# Patient Record
Sex: Male | Born: 1955 | Race: White | Hispanic: No | Marital: Married | Smoking: Current every day smoker
Health system: Southern US, Community
[De-identification: ages and names within clinical notes are randomized; demographics above are authoritative.]

## PROBLEM LIST (undated history)

## (undated) ENCOUNTER — Emergency Department (HOSPITAL_COMMUNITY): Discharge status: Absent without leave | Payer: 59

## (undated) DIAGNOSIS — Z7289 Other problems related to lifestyle: Secondary | ICD-10-CM

## (undated) DIAGNOSIS — I10 Essential (primary) hypertension: Secondary | ICD-10-CM

## (undated) DIAGNOSIS — F1090 Alcohol use, unspecified, uncomplicated: Secondary | ICD-10-CM

## (undated) DIAGNOSIS — G610 Guillain-Barre syndrome: Secondary | ICD-10-CM

## (undated) DIAGNOSIS — G5790 Unspecified mononeuropathy of unspecified lower limb: Secondary | ICD-10-CM

## (undated) DIAGNOSIS — I82409 Acute embolism and thrombosis of unspecified deep veins of unspecified lower extremity: Secondary | ICD-10-CM
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
## (undated) NOTE — *Deleted (*Deleted)
Patient discharged home with family, all belongings and equipment sent home with patient. Patient escort out by NT via [employer]

---
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
## 1997-04-21 HISTORY — PX: CHOLECYSTECTOMY: SHX55

## 2004-10-18 ENCOUNTER — Ambulatory Visit: Payer: Self-pay | Admitting: Family Medicine

## 2004-10-21 ENCOUNTER — Encounter (HOSPITAL_COMMUNITY): Admission: RE | Admit: 2004-10-21 | Discharge: 2004-11-20 | Payer: Self-pay

## 2005-01-31 ENCOUNTER — Ambulatory Visit: Payer: Self-pay | Admitting: Family Medicine

## 2005-02-28 ENCOUNTER — Ambulatory Visit: Payer: Self-pay | Admitting: Family Medicine

## 2005-12-25 ENCOUNTER — Ambulatory Visit: Payer: Self-pay | Admitting: Family Medicine

## 2006-02-10 ENCOUNTER — Ambulatory Visit: Payer: Self-pay | Admitting: Family Medicine

## 2006-07-20 ENCOUNTER — Ambulatory Visit: Payer: Self-pay | Admitting: Family Medicine

## 2006-09-28 ENCOUNTER — Ambulatory Visit: Payer: Self-pay | Admitting: Family Medicine

## 2008-07-06 ENCOUNTER — Emergency Department (HOSPITAL_COMMUNITY): Admission: EM | Admit: 2008-07-06 | Discharge: 2008-07-06 | Payer: Self-pay | Admitting: Emergency Medicine

## 2010-08-01 LAB — COMPREHENSIVE METABOLIC PANEL
ALT: 51 U/L (ref 0–53)
AST: 44 U/L — ABNORMAL HIGH (ref 0–37)
Albumin: 4.4 g/dL (ref 3.5–5.2)
Alkaline Phosphatase: 132 U/L — ABNORMAL HIGH (ref 39–117)
BUN: 17 mg/dL (ref 6–23)
CO2: 21 mEq/L (ref 19–32)
Calcium: 10 mg/dL (ref 8.4–10.5)
Chloride: 102 mEq/L (ref 96–112)
Creatinine, Ser: 0.69 mg/dL (ref 0.4–1.5)
GFR calc Af Amer: >60 mL/min (ref >60)
GFR calc non Af Amer: >60 mL/min (ref >60)
Glucose, Bld: 127 mg/dL — ABNORMAL HIGH (ref 70–99)
Potassium: 3.5 mEq/L (ref 3.5–5.1)
Sodium: 137 mEq/L (ref 135–145)
Total Bilirubin: 0.9 mg/dL (ref 0.3–1.2)
Total Protein: 6.9 g/dL (ref 6.0–8.3)

## 2010-08-01 LAB — CBC
HCT: 49.9 % (ref 39.0–52.0)
Hemoglobin: 17.3 g/dL — ABNORMAL HIGH (ref 13.0–17.0)
MCHC: 34.7 g/dL (ref 30.0–36.0)
MCV: 95.4 fL (ref 78.0–100.0)
Platelets: 163 10*3/uL (ref 150–400)
RBC: 5.23 MIL/uL (ref 4.22–5.81)
RDW: 13.5 % (ref 11.5–15.5)
WBC: 8.2 10*3/uL (ref 4.0–10.5)

## 2010-08-01 LAB — DIFFERENTIAL
Basophils Absolute: 0 10*3/uL (ref 0.0–0.1)
Basophils Relative: 0 % (ref 0–1)
Eosinophils Absolute: 0.3 10*3/uL (ref 0.0–0.7)
Eosinophils Relative: 3 % (ref 0–5)
Lymphocytes Relative: 30 % (ref 12–46)
Lymphs Abs: 2.5 10*3/uL (ref 0.7–4.0)
Monocytes Absolute: 0.5 10*3/uL (ref 0.1–1.0)
Monocytes Relative: 6 % (ref 3–12)
Neutro Abs: 5 10*3/uL (ref 1.7–7.7)
Neutrophils Relative %: 61 % (ref 43–77)

## 2010-08-01 LAB — POCT CARDIAC MARKERS
CKMB, poc: 1.1 ng/mL (ref 1.0–8.0)
Myoglobin, poc: 56.1 ng/mL (ref 12–200)
Troponin i, poc: <0.05 ng/mL (ref 0.00–0.09)

## 2010-09-17 ENCOUNTER — Other Ambulatory Visit (HOSPITAL_COMMUNITY): Payer: Self-pay | Admitting: Adult Health Nurse Practitioner

## 2010-09-17 ENCOUNTER — Ambulatory Visit (HOSPITAL_COMMUNITY)
Admission: RE | Admit: 2010-09-17 | Discharge: 2010-09-17 | Disposition: A | Discharge status: Home / Self Care | Payer: BC Managed Care – PPO | Source: Ambulatory Visit | Attending: Family Medicine | Admitting: Family Medicine

## 2010-09-17 DIAGNOSIS — I10 Essential (primary) hypertension: Secondary | ICD-10-CM | POA: Insufficient documentation

## 2010-09-17 DIAGNOSIS — J189 Pneumonia, unspecified organism: Secondary | ICD-10-CM

## 2010-09-17 DIAGNOSIS — R05 Cough: Secondary | ICD-10-CM | POA: Insufficient documentation

## 2010-09-17 DIAGNOSIS — R059 Cough, unspecified: Secondary | ICD-10-CM | POA: Insufficient documentation

## 2010-09-17 DIAGNOSIS — F17200 Nicotine dependence, unspecified, uncomplicated: Secondary | ICD-10-CM | POA: Insufficient documentation

## 2017-03-06 ENCOUNTER — Other Ambulatory Visit: Payer: Self-pay

## 2017-03-06 ENCOUNTER — Emergency Department (INDEPENDENT_AMBULATORY_CARE_PROVIDER_SITE_OTHER)
Admission: EM | Admit: 2017-03-06 | Discharge: 2017-03-06 | Disposition: A | Discharge status: Home / Self Care | Payer: 59 | Source: Home / Self Care | Attending: Family Medicine | Admitting: Family Medicine

## 2017-03-06 ENCOUNTER — Encounter: Payer: Self-pay | Admitting: Emergency Medicine

## 2017-03-06 ENCOUNTER — Emergency Department (INDEPENDENT_AMBULATORY_CARE_PROVIDER_SITE_OTHER): Payer: 59

## 2017-03-06 DIAGNOSIS — M25562 Pain in left knee: Secondary | ICD-10-CM

## 2017-03-06 HISTORY — DX: Essential (primary) hypertension: I10

## 2017-03-06 HISTORY — DX: Guillain-Barre syndrome: G61.0

## 2017-03-06 MED ORDER — MELOXICAM 15 MG PO TABS: 15.0000 mg | ORAL_TABLET | Freq: Every day | ORAL | 0 refills | Status: DC

## 2017-03-06 NOTE — Discharge Instructions (Signed)
°  Meloxicam (Mobic) is an antiinflammatory to help with pain and inflammation.  Do not take ibuprofen, Advil, Aleve, or any other medications that contain NSAIDs while taking meloxicam as this may cause stomach upset or even ulcers if taken in large amounts for an extended period of time.  ° °

## 2017-03-06 NOTE — ED Triage Notes (Signed)
Patient told his PCP 2 weeks ago he was having pain in left knee; was placed on anti-inflammatories and is to follow with him in another 2 weeks; today it began hurting so badly that he could not bear weight. Took ibuprofen at 1100. Brought from waiting room in wheelchair at his request.

## 2017-03-06 NOTE — ED Provider Notes (Signed)
[facility]    CSN: [id number] Arrival date & time: 03/06/17  1617     History   Chief Complaint Chief Complaint  Patient presents with  . Knee Pain    left    HPI Roberto LivingMark A Pavia is a 61 y.o. male.   HPI  Roberto Lynch is a 61 y.o. male presenting to UC with c/o Left knee pain that started about 2 weeks ago. He told his PCP during a routine physical and was prescribed an antiinflammatory but is unsure of the name of the medication.  He had moderate relief with the medication but completed it about 4-5 days ago.  He was advised to f/u in 2 weeks but states the pain was worsening so bad that he can barely apply weight to Left leg now.  He took ibuprofen at 11AM this morning.  Denies radiation of pain or numbness in Left leg. No known injury. No prior surgery to same knee.  No hx of gout.  Past Medical History:  Diagnosis Date  . Guillain Barr syndrome (HCC)   . Hypertension     There are no active problems to display for this patient.   Past Surgical History:  Procedure Laterality Date  . CHOLECYSTECTOMY    . TONSILLECTOMY         Home Medications    Prior to Admission medications   Medication Sig Start Date End Date Taking? Authorizing Provider  amLODipine-benazepril (LOTREL) 10-20 MG capsule Take 1 capsule daily by mouth.   Yes [provider]  nebivolol (BYSTOLIC) 10 MG tablet Take 10 mg daily by mouth.   Yes [provider]  meloxicam (MOBIC) 15 MG tablet Take 1 tablet (15 mg total) daily by mouth. For 5 days, then daily as needed for pain 03/06/17   Rolla PlatePhelps, Marrianne Sica O, PA-C    Family History History reviewed. No pertinent family history.  Social History Social History   Tobacco Use  . Smoking status: Current Every Day Smoker  . Smokeless tobacco: Never Used  Substance Use Topics  . Alcohol use: No    Frequency: Never  . Drug use: Not on file     Allergies   Bee venom   Review of Systems Review of Systems    Constitutional: Negative for chills and fever.  Musculoskeletal: Positive for arthralgias, gait problem (difficulty bearing weight on Left leg), joint swelling and myalgias.  Skin: Negative for color change and rash.  Neurological: Negative for weakness and numbness.     Physical Exam Triage Vital Signs ED Triage Vitals  Enc Vitals Group     BP 03/06/17 1701 (!) 154/76     Pulse Rate 03/06/17 1701 66     Resp 03/06/17 1701 18     Temp 03/06/17 1701 98.5 F (36.9 C)     Temp Source 03/06/17 1701 Oral     SpO2 03/06/17 1701 95 %     Weight 03/06/17 1702 270 lb (122.5 kg)     Height 03/06/17 1702 5\' 9"  (1.753 m)     Head Circumference --      Peak Flow --      Pain Score 03/06/17 1702 9     Pain Loc --      Pain Edu? --      Excl. in GC? --    No data found.  Updated Vital Signs BP (!) 154/76 (BP Location: Right Arm)   Pulse 66   Temp 98.5 F (36.9 C) (Oral)  Resp 18   Ht 5\' 9"  (1.753 m)   Wt 270 lb (122.5 kg)   SpO2 95%   BMI 39.87 kg/m   Visual Acuity Right Eye Distance:   Left Eye Distance:   Bilateral Distance:    Right Eye Near:   Left Eye Near:    Bilateral Near:     Physical Exam  Constitutional: He is oriented to person, place, and time. He appears well-developed and well-nourished. No distress.  HENT:  Head: Normocephalic and atraumatic.  Eyes: EOM are normal.  Neck: Normal range of motion.  Cardiovascular: Normal rate.  Pulmonary/Chest: Effort normal.  Musculoskeletal: Normal range of motion. He exhibits edema and tenderness.  Left knee: mild edema to medial aspect. Tender. Full ROM w/o crepitus. No posterior knee tenderness. No hip tenderness.  Calf is soft, non-tender.  Neurological: He is alert and oriented to person, place, and time.  Skin: Skin is warm and dry. He is not diaphoretic. No erythema.  Left knee: skin in tact. No ecchymosis or erythema.  Psychiatric: He has a normal mood and affect. His behavior is normal.  Nursing note and  vitals reviewed.    UC Treatments / Results  Labs (all labs ordered are listed, but only abnormal results are displayed) Labs Reviewed - No data to display  EKG  EKG Interpretation None       Radiology Dg Knee Complete 4 Views Left  Result Date: 03/06/2017 CLINICAL DATA:  Injury several months ago with persistent pain, initial encounter EXAM: LEFT KNEE - COMPLETE 4+ VIEW COMPARISON:  None. FINDINGS: No acute fracture or dislocation is noted. Mild degenerative changes are noted in the medial and patellofemoral joint spaces. No soft tissue abnormality is seen. IMPRESSION: Mild degenerative change without acute abnormality. Electronically Signed   By: Alcide CleverMark  Lukens M.D.   On: 03/06/2017 17:43    Procedures Procedures (including critical care time)  Medications Ordered in UC Medications - No data to display   Initial Impression / Assessment and Plan / UC Course  I have reviewed the triage vital signs and the nursing notes.  Pertinent labs & imaging results that were available during my care of the patient were reviewed by me and considered in my medical decision making (see chart for details).     Left knee: mild degenerative change w/o joint effusion or other acute abnormality Discussed imaging with pt Encouraged use of knee sleeve and Meloxicam F/u with Sports Medicine next week for further evaluation and treatment May benefit from knee joint injection. Pt states he had a joint injection in his Right shoulder several years ago and states that pain resolved, he hopes he can have the same done to his knee.  Final Clinical Impressions(s) / UC Diagnoses   Final diagnoses:  Left medial knee pain  Acute pain of left knee    ED Discharge Orders        Ordered    meloxicam (MOBIC) 15 MG tablet  Daily     03/06/17 1819       Controlled Substance Prescriptions [place] Controlled Substance Registry consulted? Not Applicable   Rolla Platehelps, Nezzie Manera O, PA-C 03/07/17 1207

## 2017-03-08 ENCOUNTER — Telehealth: Payer: Self-pay | Admitting: Emergency Medicine

## 2017-03-08 NOTE — Telephone Encounter (Signed)
Spoke with patient states that his knee is no better, still painful, will contact sports medicine doctors tomorrow about an appointment.

## 2017-03-09 ENCOUNTER — Encounter: Payer: Self-pay | Admitting: Family Medicine

## 2017-03-09 ENCOUNTER — Ambulatory Visit (INDEPENDENT_AMBULATORY_CARE_PROVIDER_SITE_OTHER): Payer: 59 | Admitting: Family Medicine

## 2017-03-09 VITALS — BP 144/78 | HR 78 | Temp 97.7°F | Resp 16 | Ht 69.0 in | Wt 278.1 lb

## 2017-03-09 DIAGNOSIS — M25562 Pain in left knee: Secondary | ICD-10-CM

## 2017-03-09 MED ORDER — DICLOFENAC SODIUM 1 % TD GEL: 4.0000 g | Freq: Four times a day (QID) | TRANSDERMAL | 11 refills | Status: DC

## 2017-03-09 NOTE — Progress Notes (Signed)
Subjective:    I'm seeing this patient as a consultation for:  Lurene Shadowhelps, Erin O, PA-C  CC: Left Knee Pain  HPI: Loraine LericheMark has pain in the left knee ongoing now for months.  The pain dramatically worsened a few days injury recently.  He notes some pain and swelling but denies significant locking or catching.  He points to the medial aspect of his knee as location of pain.  He was seen in urgent care on November 16 were x-ray showed mild DJD but otherwise was on he is unable to work currently.  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  In urgent care he was given prednisone, meloxicam,  Tramadol all of which have helped only a little.  He denies any pre-existing history of gout but has never been tested.   Past medical history, Surgical history, Family history not pertinant except as noted below, Social history, Allergies, and medications have been entered into the medical record, reviewed, and no changes needed.   Review of Systems: No headache, visual changes, nausea, vomiting, diarrhea, constipation, dizziness, abdominal pain, skin rash, fevers, chills, night sweats, weight loss, swollen lymph nodes, body aches, joint swelling, muscle aches, chest pain, shortness of breath, mood changes, visual or auditory hallucinations.   Objective:    Vitals:   03/09/17 1030  BP: (!) 144/78  Pulse: 78  Resp: 16  Temp: 97.7 F (36.5 C)  SpO2: 97%   General: Well Developed, well nourished, and in no acute distress.  Neuro/Psych: Alert and oriented x3, extra-ocular muscles intact, able to move all 4 extremities, sensation grossly intact. Skin: Warm and dry, no rashes noted.  Respiratory: Not using accessory muscles, speaking in full sentences, trachea midline.  Cardiovascular: Pulses palpable, no extremity edema. Abdomen: Does not appear distended. MSK: Left knee mild effusion otherwise normal-appearing with no. Range of motion 0 patient's present. Tender to palpation medial joint  line. Intact flexion and extension strength. Intact stable ligamentous exam testing. Positive medial McMurray's test. Positive Thessaly's test. Antalgic gait present.  Procedure: Real-time Ultrasound Guided Injection of left knee  Device: GE Logiq E  Images permanently stored and available for review in the ultrasound unit. Verbal informed consent obtained. Discussed risks and benefits of procedure. Warned about infection bleeding damage to structures skin hypopigmentation and fat atrophy among others. Patient expresses understanding and agreement Time-out conducted.  Noted no overlying erythema, induration, or other signs of local infection.  Skin prepped in a sterile fashion.  Local anesthesia: Topical Ethyl chloride.  With sterile technique and under real time ultrasound guidance: 80mg  kenalog and 4ml marcaine injected easily.  Completed without difficulty  Pain immediately resolved suggesting accurate placement of the medication.  Advised to call if fevers/chills, erythema, induration, drainage, or persistent bleeding.  Images permanently stored and available for review in the ultrasound unit.  Impression: Technically successful ultrasound guided injection.    CLINICAL DATA:  Injury several months ago with persistent pain, initial encounter  EXAM: LEFT KNEE - COMPLETE 4+ VIEW  COMPARISON:  None.  FINDINGS: No acute fracture or dislocation is noted. Mild degenerative changes are noted in the medial and patellofemoral joint spaces. No soft tissue abnormality is seen.  IMPRESSION: Mild degenerative change without acute abnormality.   Electronically Signed   By: Alcide CleverMark  Lukens M.D.   On: 03/06/2017 17:43    Impression and Recommendations:    Assessment and Plan: 61 y.o. male with left knee pain.  This is concerning for degenerative medial  meniscus injury versus exacerbation of DJD versus gout.  Plan to try an empiric treatment with steroid injection  as well as diclofenac gel.  If not better would rapidly proceed with MRI of his the pain is currently debilitating and preventing work and I am concerned for either a meniscus injury or  OCD lesion. Work note provided .  Recheck as needed if not better.   No orders of the defined types were placed in this encounter.  Meds ordered this encounter  Medications  . diclofenac sodium (VOLTAREN) 1 % GEL    Sig: Apply 4 g 4 (four) times daily topically. To affected joint.    Dispense:  100 g    Refill:  11    Discussed warning signs or symptoms. Please see discharge instructions. Patient expresses understanding.

## 2017-03-09 NOTE — Patient Instructions (Signed)
Thank you for coming in today. Call or go to the ER if you develop a large red swollen joint with extreme pain or oozing puss.  Apply the diclofenac gel  Recheck as needed. If not getting better let me know and I will order the MRI.

## 2017-03-18 ENCOUNTER — Telehealth: Payer: Self-pay

## 2017-03-18 DIAGNOSIS — M25562 Pain in left knee: Secondary | ICD-10-CM

## 2017-03-18 NOTE — Telephone Encounter (Signed)
Spoke to patient gave him information as noted below. Walfred Bettendorf,CMA  

## 2017-03-18 NOTE — Telephone Encounter (Signed)
Knee MRI ordered.  Follow up after MRI.  If he cannot work return to clinic to go over pain control options and work restrictions.  Otherwise follow up after MRI>

## 2017-03-18 NOTE — Telephone Encounter (Signed)
Patient called stated that he got cortisone injections done a week and half ago he stated that he was told to call the office back if not better, he  is still hurting. Patient want to know what he needs to do at this point. Please advise. Fountain Derusha,CMA

## 2017-04-06 ENCOUNTER — Other Ambulatory Visit: Payer: 59

## 2017-05-04 ENCOUNTER — Ambulatory Visit (INDEPENDENT_AMBULATORY_CARE_PROVIDER_SITE_OTHER): Payer: 59

## 2017-05-04 DIAGNOSIS — M25562 Pain in left knee: Secondary | ICD-10-CM

## 2017-05-04 DIAGNOSIS — M25462 Effusion, left knee: Secondary | ICD-10-CM | POA: Diagnosis not present

## 2017-05-04 DIAGNOSIS — M23222 Derangement of posterior horn of medial meniscus due to old tear or injury, left knee: Secondary | ICD-10-CM | POA: Diagnosis not present

## 2017-05-05 ENCOUNTER — Encounter: Payer: Self-pay | Admitting: Family Medicine

## 2017-05-05 ENCOUNTER — Ambulatory Visit (INDEPENDENT_AMBULATORY_CARE_PROVIDER_SITE_OTHER): Payer: 59 | Admitting: Family Medicine

## 2017-05-05 DIAGNOSIS — S83232A Complex tear of medial meniscus, current injury, left knee, initial encounter: Secondary | ICD-10-CM | POA: Diagnosis not present

## 2017-05-05 DIAGNOSIS — S83249A Other tear of medial meniscus, current injury, unspecified knee, initial encounter: Secondary | ICD-10-CM | POA: Insufficient documentation

## 2017-05-05 NOTE — Patient Instructions (Signed)
Thank you for coming in today. You have a meniscus injury.  I think you would benefit from surgery.  I am referring to [facility] in [place].  You should hear form them soon.  Let me know if you do not hear anything.  [phone]   Meniscus Tear A meniscus tear is a knee injury in which a piece of the meniscus is torn. The meniscus is a thick, rubbery, wedge-shaped cartilage in the knee. Two menisci are located in each knee. They sit between the upper bone (femur) and lower bone (tibia) that make up the knee joint. Each meniscus acts as a shock absorber for the knee. A torn meniscus is one of the most common types of knee injuries. This injury can range from mild to severe. Surgery may be needed for a severe tear. What are the causes? This injury may be caused by any squatting, twisting, or pivoting movement. Sports-related injuries are the most common cause. These often occur from:  Running and stopping suddenly.  Changing direction.  Being tackled or knocked off your feet.  As people get older, their meniscus gets thinner and weaker. In these people, tears can happen more easily, such as from climbing stairs. What increases the risk? This injury is more likely to happen to:  People who play contact sports.  Males.  People who are 630?60 years of age.  What are the signs or symptoms? Symptoms of this injury include:  Knee pain, especially at the side of the knee joint. You may feel pain when the injury occurs, or you may only hear a pop and feel pain later.  A feeling that your knee is clicking, catching, locking, or giving way.  Not being able to fully bend or extend your knee.  Bruising or swelling in your knee.  How is this diagnosed? This injury may be diagnosed based on your symptoms and a physical exam. The physical exam may include:  Moving your knee in different ways.  Feeling for tenderness.  Listening for a clicking sound.  Checking if  your knee locks or catches.  You may also have tests, such as:  X-rays.  MRI.  A procedure to look inside your knee with a narrow surgical telescope (arthroscopy).  You may be referred to a knee specialist (orthopedic surgeon). How is this treated? Treatment for this injury depends on the severity of the tear. Treatment for a mild tear may include:  Rest.  Medicine to reduce pain and swelling. This is usually a nonsteroidal anti-inflammatory drug (NSAID).  A knee brace or an elastic sleeve or wrap.  Using crutches or a walker to keep weight off your knee and to help you walk.  Exercises to strengthen your knee (physical therapy).  You may need surgery if you have a severe tear or if other treatments are not working. Follow these instructions at home: Managing pain and swelling  Take over-the-counter and prescription medicines only as told by your health care provider.  If directed, apply ice to the injured area: ? Put ice in a plastic bag. ? Place a towel between your skin and the bag. ? Leave the ice on for 20 minutes, 2-3 times per day.  Raise (elevate) the injured area above the level of your heart while you are sitting or lying down. Activity  Do not use the injured limb to support your body weight until your health care provider says that you can. Use crutches or a walker as told by your  health care provider.  Return to your normal activities as told by your health care provider. Ask your health care provider what activities are safe for you.  Perform range-of-motion exercises only as told by your health care provider.  Begin doing exercises to strengthen your knee and leg muscles only as told by your health care provider. After you recover, your health care provider may recommend these exercises to help prevent another injury. General instructions  Use a knee brace or elastic wrap as told by your health care provider.  Keep all follow-up visits as told by your  health care provider. This is important. Contact a health care provider if:  You have a fever.  Your knee becomes red, tender, or swollen.  Your pain medicine is not helping.  Your symptoms get worse or do not improve after 2 weeks of home care. This information is not intended to replace advice given to you by your health care provider. Make sure you discuss any questions you have with your health care provider. Document Released: 06/28/2002 Document Revised: 09/13/2015 Document Reviewed: 07/31/2014 Elsevier Interactive Patient Education  [employer].
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
## 2017-05-26 ENCOUNTER — Ambulatory Visit: Payer: 59 | Attending: Orthopedic Surgery | Admitting: Physical Therapy

## 2017-05-26 ENCOUNTER — Other Ambulatory Visit: Payer: Self-pay

## 2017-05-26 DIAGNOSIS — R262 Difficulty in walking, not elsewhere classified: Secondary | ICD-10-CM | POA: Insufficient documentation

## 2017-05-26 DIAGNOSIS — M25562 Pain in left knee: Secondary | ICD-10-CM | POA: Insufficient documentation

## 2017-05-26 NOTE — Therapy (Signed)
[facility] [address] [place], [place], [address] Phone: [phone]   Fax:  [phone]  Physical Therapy Evaluation  Patient Details  Name: Roberto Lynch MRN: [id number] Date of Birth: 05-25-1955 Referring Provider: Thurston Hole   Encounter Date: 05/26/2017  PT End of Session - 05/26/17 0933    Visit Number  1    Number of Visits  12    Date for PT Re-Evaluation  07/07/17    Authorization Type  FOTO every 5th visit    PT Start Time  0817    PT Stop Time  0902    PT Time Calculation (min)  45 min    Activity Tolerance  Patient tolerated treatment well    Behavior During Therapy  [facility] for tasks assessed/performed       Past Medical History:  Diagnosis Date  . Guillain Barr syndrome (HCC)   . Hypertension     Past Surgical History:  Procedure Laterality Date  . CHOLECYSTECTOMY  1999  . TONSILLECTOMY  1969    There were no vitals filed for this visit.   Subjective Assessment - 05/26/17 0928    Subjective  Patient presents to physical therapy with complaints of knee pain and L leg weakness s/p L medial and lateral meniscectomy and chondroplasty. Patient unable to recall date but states surgery was in the latter half of January, approximately 2 weeks ago. (Week of Jan 22) Patient states he is currently unable to stand for greater than 30 minutes to an hour and has difficulty walking long distances. Patient's at worst pain about 4/10 during walking, kneeling, and increased activity throughout the day. Patient has no pain at rest and when he ices it. Patient is currently not working; pt reports expected to return in 2-3 weeks. 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     Pertinent History  COPD, HTN    Limitations  Walking;Standing    How long can you stand comfortably?  30 mins - 1hr    Diagnostic tests  MRI, x-ray    Patient Stated Goals   walk long distances, crawl, go up and down ladders, get back to where I was before    Currently in Pain?  Yes    Pain Score  3     Pain Location  Knee    Pain Orientation  Left    Pain Descriptors / Indicators  Aching;Sore    Pain Type  Acute pain    Pain Onset  1 to 4 weeks ago    Pain Frequency  Intermittent    Aggravating Factors   walking, standing, and kneeling    Pain Relieving Factors  ice and rest         [facility] PT Assessment - 05/26/17 0001      Assessment   Medical Diagnosis  L knee medial and lateral menisectomy and chondroplasty    Referring Provider  [facility]    Onset Date/Surgical Date  -- Late January; unable to recall date    Next MD Visit  June 12, 2017    Prior Therapy  none      Restrictions   Weight Bearing Restrictions  No      Balance Screen   Has the patient fallen in the past 6 months  No    Has the patient had a decrease in activity level because of a fear of falling?  No    Is the patient reluctant to leave their home because of a fear of falling?   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 incision healing with little scabbing remaining    Focus on Therapeutic Outcomes (FOTO)   62% Limitation      Observation/Other Assessments-Edema    Edema  Circumferential no significant difference between LEs.      Sensation   Light Touch  Appears Intact      ROM / Strength   AROM / PROM / Strength  AROM;PROM;Strength      AROM   Overall AROM   Within functional limits for tasks performed    AROM Assessment Site  Knee    Right/Left Knee   Left    Left Knee Extension  -2    Left Knee Flexion  125      PROM   Overall PROM   Within functional limits for tasks performed    PROM Assessment Site  Knee    Right/Left Knee  Left    Left Knee Extension  0    Left Knee Flexion  130      Strength   Overall Strength  Within functional limits for tasks performed    Overall Strength Comments  4+/5 bilateral hip flexion strength; 5/5 ankle DF strength.    Strength Assessment Site  Knee    Right/Left Knee  Left    Left Knee Flexion  4+/5    Left Knee Extension  5/5      Flexibility   Soft Tissue Assessment /Muscle Length  yes    Hamstrings  limited hamstring flexibilty secondary to "tightness in calves"      Transfers   Transfers  Independent with all Transfers      Ambulation/Gait   Ambulation/Gait  Yes    Ambulation/Gait Assistance  7: Independent    Gait Pattern  Step-through pattern;Decreased hip/knee flexion - left;Decreased weight shift to left;Wide base of support;Antalgic;Decreased step length - right;Decreased stance time - left      Balance   Balance Assessed  Yes      High Level Balance   High Level Balance Comments  L SLS 6 seconds; tandem 8 seconds; increased AP and lateral sway and arms in high guard.             Objective measurements completed on examination: See above findings.      [facility] Adult PT Treatment/Exercise - 05/26/17 0001      Exercises   Exercises  Knee/Hip      Knee/Hip Exercises: Aerobic   Nustep  Level 5, x10 minutes, no UE use               PT Short Term Goals - 05/26/17 1153      PT SHORT TERM GOAL #1   Title  STG=LTG        PT Long Term Goals - 05/26/17 1154      PT LONG TERM GOAL #1  Title  Patient will improve L knee flexion strength to 5/5 to better perform functional activities.    Time  6    Period  Weeks    Status  New    Target Date  07/07/17      PT LONG TERM GOAL #2   Title  Patient will improve L SLS to greater than or equal to 10  seconds.    Time  6    Period  Weeks    Status  New    Target Date  07/07/17      PT LONG TERM GOAL #3   Title  Patient will self report ability to walk community distances with less than or equal to 1/10 L knee pain in order to perform work duties.    Time  6    Period  Weeks    Status  New    Target Date  07/07/17      PT LONG TERM GOAL #4   Title  Patient will be able to tolerate kneeling and quadruped position for greater than 30 minutes with less than or equal to 2/10 L knee pain in order to perform crawling for work activities.    Time  6    Period  Weeks    Status  New    Target Date  07/07/17      PT LONG TERM GOAL #5   Title  Patient will be able to stand with equal LE weight distribution for greater than 1 hour with less than or equal to 1/10 pain to perform household activities.     Time  6    Period  Weeks    Status  New    Target Date  07/07/17      Additional Long Term Goals   Additional Long Term Goals  Yes      PT LONG TERM GOAL #6   Title  Patient will self report no pain with ascending and descending a ladder with reciprocating gait pattern in order to perform work activities.     Time  6    Period  Weeks    Status  New    Target Date  07/07/17             Plan - 05/26/17 1145    Clinical Impression Statement  Patient is a 62 year-old-male who presents to physical therapy s/p L knee medial and lateral meniscectomy and chondroplasty. Patient has very good strength and AROM however patient still reports difficulty with functional activities and work-related activities secondary to pain. Patient demonstrates antalgic gait pattern, decreased R step length, decreased L stance time, increased lateral sway, and decreased L knee flexion during swing phase. Patient has difficulty with SLS balance; limited to 6 seconds. Patient would benefit from skilled physical therapy to improve strength and stability of left LE and improve balance for patient to perform work  duties and house hold activities. Patient demonstrates very good rehab potential as noted by  high PLOF, high motivation to return to [facility] and return to work.     Clinical Presentation  Stable    Clinical Decision Making  Low    Rehab Potential  Excellent    Clinical Impairments Affecting Rehab Potential  COPD, HTN    PT Frequency  2x / week    PT Duration  6 weeks    PT Treatment/Interventions  ADLs/Self Care Home Management;Electrical Stimulation;Iontophoresis 4mg /ml Dexamethasone;Moist Heat;Cryotherapy;Ultrasound;Therapeutic exercise;Therapeutic activities;Functional mobility training;Stair training;Gait training;Balance training;Neuromuscular re-education;Patient/family education;Manual techniques;Passive  range of motion;Dry needling;Vasopneumatic Device    PT Next Visit Plan  Begin knee stabilization and strengthening program; modalities PRN for pain.    Consulted and Agree with Plan of Care  Patient       Patient will benefit from skilled therapeutic intervention in order to improve the following deficits and impairments:  Abnormal gait, Decreased balance, Difficulty walking, Decreased strength, Pain  Visit Diagnosis: Left knee pain, unspecified chronicity - Plan: PT plan of care cert/re-cert  Difficulty in walking, not elsewhere classified - Plan: PT plan of care cert/re-cert     Problem List Patient Active Problem List   Diagnosis Date Noted  . Medial meniscus tear 05/05/2017   Guss Bunde, PT, DPT 05/26/2017, 12:17 PM  [facility] [address] [place], [place], [address] Phone: [phone]   Fax:  [phone]  Name: DEZMEN ALCOCK MRN: [id number] Date of Birth: 10-Mar-1956

## 2017-05-28 ENCOUNTER — Encounter: Payer: Self-pay | Admitting: Physical Therapy

## 2017-05-28 ENCOUNTER — Ambulatory Visit: Payer: 59 | Admitting: Physical Therapy

## 2017-05-28 DIAGNOSIS — R262 Difficulty in walking, not elsewhere classified: Secondary | ICD-10-CM

## 2017-05-28 DIAGNOSIS — M25562 Pain in left knee: Secondary | ICD-10-CM

## 2017-05-28 NOTE — Therapy (Signed)
[facility] [address] [place], [place], [address] Phone: [phone]   Fax:  [phone]  Physical Therapy Treatment  Patient Details  Name: Roberto Lynch MRN: [id number] Date of Birth: [birth year] Referring Provider: Thurston Hole   Encounter Date: 05/28/2017  PT End of Session - 05/28/17 0819    Visit Number  2    Number of Visits  12    Date for PT Re-Evaluation  07/07/17    Authorization Type  FOTO every 5th visit    PT Start Time  0817    PT Stop Time  0905    PT Time Calculation (min)  48 min    Activity Tolerance  Patient tolerated treatment well    Behavior During Therapy  [facility] for tasks assessed/performed       Past Medical History:  Diagnosis Date  . Guillain Barr syndrome (HCC)   . Hypertension     Past Surgical History:  Procedure Laterality Date  . CHOLECYSTECTOMY  1999  . TONSILLECTOMY  1969    There were no vitals filed for this visit.  Subjective Assessment - 05/28/17 0818    Subjective  Reports that he only really has pain when he puts weight on it. Reports that he has knee stiffness upon arrival.    Pertinent History  COPD, HTN    Limitations  Walking;Standing    How long can you stand comfortably?  30 mins - 1hr    Diagnostic tests  MRI, x-ray    Patient Stated Goals  walk long distances, crawl, go up and down ladders, get back to where I was before    Currently in Pain?  Yes    Pain Score  3     Pain Location  Knee    Pain Orientation  Left    Pain Type  Acute pain    Pain Onset  1 to 4 weeks ago    Pain Frequency  Intermittent    Aggravating Factors   Weightbearing         [facility] PT Assessment - 05/28/17 0001      Assessment   Medical Diagnosis  L knee medial and lateral menisectomy and chondroplasty    Next MD Visit  June 12, 2017    Prior Therapy  none      Restrictions   Weight Bearing Restrictions  No                  [facility] Adult PT Treatment/Exercise - 05/28/17 0001      Knee/Hip Exercises: Aerobic   Nustep  Level 5, x10 minutes, no UE use      Knee/Hip Exercises: Machines for Strengthening   Cybex Knee Extension  20# 3x10 reps    Cybex Knee Flexion  50# 3x10 reps    Cybex Leg Press  3 pl, seat 8 x30 reps      Knee/Hip Exercises: Standing   Terminal Knee Extension  Strengthening;Left;20 reps;Limitations    Terminal Knee Extension Limitations  Pin kXTS    Hip Abduction  AROM;Both;15 reps;Knee straight    Forward Step Up  Left;Step Height: 4";Step Height: 6";Step Height: 8" With all heights attempted patient reported medial L knee pa      Knee/Hip Exercises: Supine   Bridges  Strengthening;Both;20 reps    Straight Leg Raises  Strengthening;Left;20 reps    Straight Leg Raise with External Rotation  Strengthening;Left;20 reps      Knee/Hip Exercises: Sidelying   Hip ABduction  AROM;Left;20 reps  Modalities   Modalities  Vasopneumatic      Vasopneumatic   Number Minutes Vasopneumatic   10 minutes    Vasopnuematic Location   Knee    Vasopneumatic Pressure  Low    Vasopneumatic Temperature   34               PT Short Term Goals - 05/26/17 1153      PT SHORT TERM GOAL #1   Title  STG=LTG        PT Long Term Goals - 05/26/17 1154      PT LONG TERM GOAL #1   Title  Patient will improve L knee flexion strength to 5/5 to better perform functional activities.    Time  6    Period  Weeks    Status  New    Target Date  07/07/17      PT LONG TERM GOAL #2   Title  Patient will improve L SLS to greater than or equal to 10 seconds.    Time  6    Period  Weeks    Status  New    Target Date  07/07/17      PT LONG TERM GOAL #3   Title  Patient will self report ability to walk community distances with less than or equal to 1/10 L knee pain in order to perform work duties.    Time  6    Period  Weeks    Status  New    Target Date  07/07/17      PT LONG TERM GOAL #4   Title  Patient will be able to tolerate kneeling and  quadruped position for greater than 30 minutes with less than or equal to 2/10 L knee pain in order to perform crawling for work activities.    Time  6    Period  Weeks    Status  New    Target Date  07/07/17      PT LONG TERM GOAL #5   Title  Patient will be able to stand with equal LE weight distribution for greater than 1 hour with less than or equal to 1/10 pain to perform household activities.     Time  6    Period  Weeks    Status  New    Target Date  07/07/17      Additional Long Term Goals   Additional Long Term Goals  Yes      PT LONG TERM GOAL #6   Title  Patient will self report no pain with ascending and descending a ladder with reciprocating gait pattern in order to perform work activities.     Time  6    Period  Weeks    Status  New    Target Date  07/07/17            Plan - 05/28/17 0901    Clinical Impression Statement  Patient tolerated today's treatment fairly well as he only reported increased L knee pain with forward step ups at all stair heights attempted as well as LLE ER for SLR with ER. No complaints with any machine strengthening exercises. Patient did report intermittant L knee popping wiht TKE with pink XTS but they were not painful. Normal vasopnuematic response noted following removal of the modality.    Rehab Potential  Excellent    Clinical Impairments Affecting Rehab Potential  COPD, HTN    PT Frequency  2x / week  PT Duration  6 weeks    PT Treatment/Interventions  ADLs/Self Care Home Management;Electrical Stimulation;Iontophoresis 4mg /ml Dexamethasone;Moist Heat;Cryotherapy;Ultrasound;Therapeutic exercise;Therapeutic activities;Functional mobility training;Stair training;Gait training;Balance training;Neuromuscular re-education;Patient/family education;Manual techniques;Passive range of motion;Dry needling;Vasopneumatic Device    PT Next Visit Plan  Begin knee stabilization and strengthening program; modalities PRN for pain.    Consulted and  Agree with Plan of Care  Patient       Patient will benefit from skilled therapeutic intervention in order to improve the following deficits and impairments:  Abnormal gait, Decreased balance, Difficulty walking, Decreased strength, Pain  Visit Diagnosis: Left knee pain, unspecified chronicity  Difficulty in walking, not elsewhere classified     Problem List Patient Active Problem List   Diagnosis Date Noted  . Medial meniscus tear 05/05/2017    Marvell FullerKelsey P Josealfredo Adkins, PTA 05/28/2017, 9:14 AM  [facility] [address] [place], [place], [address] Phone: [phone]   Fax:  [phone]  Name: Roberto LivingMark A Lynch MRN: [id number] Date of Birth: 08-May-1955

## 2017-06-01 ENCOUNTER — Encounter: Payer: Self-pay | Admitting: Physical Therapy

## 2017-06-01 ENCOUNTER — Ambulatory Visit: Payer: 59 | Admitting: Physical Therapy

## 2017-06-01 DIAGNOSIS — R262 Difficulty in walking, not elsewhere classified: Secondary | ICD-10-CM

## 2017-06-01 DIAGNOSIS — M25562 Pain in left knee: Secondary | ICD-10-CM | POA: Diagnosis not present

## 2017-06-01 NOTE — Therapy (Signed)
[facility] [address] [place], [place], [address] Phone: [phone]   Fax:  [phone]  Physical Therapy Treatment  Patient Details  Name: Roberto Lynch MRN: [id number] Date of Birth: [birth year] Referring Provider: Thurston HoleWainer   Encounter Date: 06/01/2017  PT End of Session - 06/01/17 [id number]    Visit Number  3    Number of Visits  12    Date for PT Re-Evaluation  07/07/17    Authorization Type  FOTO every 5th visit    PT Start Time  0820    PT Stop Time  0903    PT Time Calculation (min)  43 min    Activity Tolerance  Patient tolerated treatment well    Behavior During Therapy  [facility] for tasks assessed/performed       Past Medical History:  Diagnosis Date  . Guillain Barr syndrome (HCC)   . Hypertension     Past Surgical History:  Procedure Laterality Date  . CHOLECYSTECTOMY  1999  . TONSILLECTOMY  1969    There were no vitals filed for this visit.  Subjective Assessment - 06/01/17 0821    Subjective  Reports that his knee is sore upon arrival. Reports he recently went to the grocery store and has had the soreness ever since. Reports a small ridge that went from L patella to inferiomedial L knee and after massage the ridge went away.    Pertinent History  COPD, HTN    Limitations  Walking;Standing    How long can you stand comfortably?  30 mins - 1hr    Diagnostic tests  MRI, x-ray    Patient Stated Goals  walk long distances, crawl, go up and down ladders, get back to where I was before    Currently in Pain?  Yes    Pain Score  4     Pain Location  Knee    Pain Orientation  Left    Pain Descriptors / Indicators  Sore    Pain Type  Acute pain    Pain Onset  1 to 4 weeks ago    Pain Frequency  Constant         [facility] PT Assessment - 06/01/17 0001      Assessment   Medical Diagnosis  L knee medial and lateral menisectomy and chondroplasty    Next MD Visit  June 12, 2017    Prior Therapy  none      Restrictions   Weight Bearing Restrictions  No      Observation/Other Assessments-Edema    Edema  Circumferential      Circumferential Edema   Circumferential - Right  43.6 cm    Circumferential - Left   45.1 cm                  [facility] Adult PT Treatment/Exercise - 06/01/17 0001      Knee/Hip Exercises: Aerobic   Recumbent Bike  L2, seat 6 x10 min      Knee/Hip Exercises: Machines for Strengthening   Cybex Knee Extension  20# 3x10 reps    Cybex Knee Flexion  30# x20 reps, 50# x10 reps    Cybex Leg Press  3 pl, seat 7 x30 reps      Knee/Hip Exercises: Standing   Terminal Knee Extension  Strengthening;Left;20 reps;Limitations    Terminal Knee Extension Limitations  Pink XTS    SLS  x3 reps of max hold; reported pain with knee instability  Knee/Hip Exercises: Supine   Bridges  Strengthening;Both;20 reps    Straight Leg Raises  Strengthening;Left;20 reps    Straight Leg Raise with External Rotation  Strengthening;Left;20 reps      Knee/Hip Exercises: Sidelying   Hip ABduction  AROM;Left;20 reps      Modalities   Modalities  Vasopneumatic      Vasopneumatic   Number Minutes Vasopneumatic   10 minutes    Vasopnuematic Location   Knee    Vasopneumatic Pressure  Medium    Vasopneumatic Temperature   34               PT Short Term Goals - 05/26/17 1153      PT SHORT TERM GOAL #1   Title  STG=LTG        PT Long Term Goals - 05/26/17 1154      PT LONG TERM GOAL #1   Title  Patient will improve L knee flexion strength to 5/5 to better perform functional activities.    Time  6    Period  Weeks    Status  New    Target Date  07/07/17      PT LONG TERM GOAL #2   Title  Patient will improve L SLS to greater than or equal to 10 seconds.    Time  6    Period  Weeks    Status  New    Target Date  07/07/17      PT LONG TERM GOAL #3   Title  Patient will self report ability to walk community distances with less than or equal to 1/10 L knee pain  in order to perform work duties.    Time  6    Period  Weeks    Status  New    Target Date  07/07/17      PT LONG TERM GOAL #4   Title  Patient will be able to tolerate kneeling and quadruped position for greater than 30 minutes with less than or equal to 2/10 L knee pain in order to perform crawling for work activities.    Time  6    Period  Weeks    Status  New    Target Date  07/07/17      PT LONG TERM GOAL #5   Title  Patient will be able to stand with equal LE weight distribution for greater than 1 hour with less than or equal to 1/10 pain to perform household activities.     Time  6    Period  Weeks    Status  New    Target Date  07/07/17      Additional Long Term Goals   Additional Long Term Goals  Yes      PT LONG TERM GOAL #6   Title  Patient will self report no pain with ascending and descending a ladder with reciprocating gait pattern in order to perform work activities.     Time  6    Period  Weeks    Status  New    Target Date  07/07/17            Plan - 06/01/17 0854    Clinical Impression Statement  Patient tolerated today's treatment fairly well today as he arrived with reports of L knee soreness since walking around local grocery store. Patient demonstrated difficulty and discomfort with sitting in figure 4 position with LLE since L knee arthoscopy. Patient reported minimal discomfort with leg  press today as well as SLS on floor due to instability per patient report. Very minimal L extensor lag present with L SLR in supine. Only minimal increased L knee edema compared to R knee. Normal vasopneumatic response noted following removal of the modality.    Rehab Potential  Excellent    Clinical Impairments Affecting Rehab Potential  COPD, HTN    PT Frequency  2x / week    PT Duration  6 weeks    PT Treatment/Interventions  ADLs/Self Care Home Management;Electrical Stimulation;Iontophoresis 4mg /ml Dexamethasone;Moist Heat;Cryotherapy;Ultrasound;Therapeutic  exercise;Therapeutic activities;Functional mobility training;Stair training;Gait training;Balance training;Neuromuscular re-education;Patient/family education;Manual techniques;Passive range of motion;Dry needling;Vasopneumatic Device    PT Next Visit Plan  Begin knee stabilization and strengthening program; modalities PRN for pain.    Consulted and Agree with Plan of Care  Patient       Patient will benefit from skilled therapeutic intervention in order to improve the following deficits and impairments:  Abnormal gait, Decreased balance, Difficulty walking, Decreased strength, Pain  Visit Diagnosis: Left knee pain, unspecified chronicity  Difficulty in walking, not elsewhere classified     Problem List Patient Active Problem List   Diagnosis Date Noted  . Medial meniscus tear 05/05/2017    Roberto Lynch, PTA 06/01/2017, 9:11 AM  [facility] [address] [place], [place], [address] Phone: [phone]   Fax:  [phone]  Name: RYAN OGBORN MRN: [id number] Date of Birth: 30-May-1955

## 2017-06-04 ENCOUNTER — Encounter: Payer: Self-pay | Admitting: Physical Therapy

## 2017-06-04 ENCOUNTER — Ambulatory Visit: Payer: 59 | Admitting: Physical Therapy

## 2017-06-04 DIAGNOSIS — R262 Difficulty in walking, not elsewhere classified: Secondary | ICD-10-CM

## 2017-06-04 DIAGNOSIS — M25562 Pain in left knee: Secondary | ICD-10-CM | POA: Diagnosis not present

## 2017-06-04 NOTE — Therapy (Signed)
[facility] [address] [place], [place], [address] Phone: [phone]   Fax:  [phone]  Physical Therapy Treatment  Patient Details  Name: Roberto Lynch MRN: [id number] Date of Birth: July 08, 1955 Referring Provider: Thurston Hole   Encounter Date: 06/04/2017  PT End of Session - 06/04/17 0835    Visit Number  4    Number of Visits  12    Date for PT Re-Evaluation  07/07/17    Authorization Type  FOTO every 5th visit    PT Start Time  0818    PT Stop Time  0905    PT Time Calculation (min)  47 min    Activity Tolerance  Patient tolerated treatment well    Behavior During Therapy  [facility] for tasks assessed/performed       Past Medical History:  Diagnosis Date  . Guillain Barr syndrome (HCC)   . Hypertension     Past Surgical History:  Procedure Laterality Date  . CHOLECYSTECTOMY  1999  . TONSILLECTOMY  1969    There were no vitals filed for this visit.  Subjective Assessment - 06/04/17 0820    Subjective  Reports that his knee feels about the same.     Pertinent History  COPD, HTN    Limitations  Walking;Standing    How long can you stand comfortably?  30 mins - 1hr    Diagnostic tests  MRI, x-ray    Patient Stated Goals  walk long distances, crawl, go up and down ladders, get back to where I was before    Currently in Pain?  Yes    Pain Score  3     Pain Location  Knee    Pain Orientation  Left    Pain Descriptors / Indicators  Sore    Pain Type  Acute pain    Pain Onset  1 to 4 weeks ago    Aggravating Factors   Walking, weightbearing         [facility] PT Assessment - 06/04/17 0001      Assessment   Medical Diagnosis  L knee medial and lateral menisectomy and chondroplasty    Next MD Visit  June 12, 2017    Prior Therapy  none      Restrictions   Weight Bearing Restrictions  No                  [facility] Adult PT Treatment/Exercise - 06/04/17 0001      Knee/Hip Exercises: Aerobic   Nustep  L6 x10  min      Knee/Hip Exercises: Machines for Strengthening   Cybex Knee Extension  20# 3x10 reps    Cybex Knee Flexion  40# 3x10 reps    Cybex Leg Press  3 pl, seat 7 x30 reps      Knee/Hip Exercises: Standing   Terminal Knee Extension  Strengthening;Left;20 reps;Limitations    Terminal Knee Extension Limitations  Orange XTS      Knee/Hip Exercises: Supine   Bridges  Strengthening;Both;20 reps    Straight Leg Raise with External Rotation  Strengthening;Left;20 reps      Knee/Hip Exercises: Sidelying   Hip ABduction  AROM;Left;20 reps      Modalities   Modalities  Vasopneumatic      Vasopneumatic   Number Minutes Vasopneumatic   10 minutes    Vasopnuematic Location   Knee    Vasopneumatic Pressure  Medium    Vasopneumatic Temperature   34  PT Short Term Goals - 05/26/17 1153      PT SHORT TERM GOAL #1   Title  STG=LTG        PT Long Term Goals - 05/26/17 1154      PT LONG TERM GOAL #1   Title  Patient will improve L knee flexion strength to 5/5 to better perform functional activities.    Time  6    Period  Weeks    Status  New    Target Date  07/07/17      PT LONG TERM GOAL #2   Title  Patient will improve L SLS to greater than or equal to 10 seconds.    Time  6    Period  Weeks    Status  New    Target Date  07/07/17      PT LONG TERM GOAL #3   Title  Patient will self report ability to walk community distances with less than or equal to 1/10 L knee pain in order to perform work duties.    Time  6    Period  Weeks    Status  New    Target Date  07/07/17      PT LONG TERM GOAL #4   Title  Patient will be able to tolerate kneeling and quadruped position for greater than 30 minutes with less than or equal to 2/10 L knee pain in order to perform crawling for work activities.    Time  6    Period  Weeks    Status  New    Target Date  07/07/17      PT LONG TERM GOAL #5   Title  Patient will be able to stand with equal LE weight  distribution for greater than 1 hour with less than or equal to 1/10 pain to perform household activities.     Time  6    Period  Weeks    Status  New    Target Date  07/07/17      Additional Long Term Goals   Additional Long Term Goals  Yes      PT LONG TERM GOAL #6   Title  Patient will self report no pain with ascending and descending a ladder with reciprocating gait pattern in order to perform work activities.     Time  6    Period  Weeks    Status  New    Target Date  07/07/17            Plan - 06/04/17 0854    Clinical Impression Statement  Patient tolerated today's treatment fairly well as he continues to experience L knee discomfort especially with ambulation and weightbearing. Patient has no complaints of any increased pain during any part of treatment today. Minimal L knee extensor lag noted with SLR. Patient required VCs to maintain full L knee extension with SLR and SL hip abduction. Normal modalities response noted following removal of the modalities. Patient observed as ambulating with antalgic gait and reduced stance time on LLE.    Rehab Potential  Excellent    Clinical Impairments Affecting Rehab Potential  COPD, HTN    PT Frequency  2x / week    PT Duration  6 weeks    PT Treatment/Interventions  ADLs/Self Care Home Management;Electrical Stimulation;Iontophoresis 4mg /ml Dexamethasone;Moist Heat;Cryotherapy;Ultrasound;Therapeutic exercise;Therapeutic activities;Functional mobility training;Stair training;Gait training;Balance training;Neuromuscular re-education;Patient/family education;Manual techniques;Passive range of motion;Dry needling;Vasopneumatic Device    PT Next Visit Plan  Begin knee stabilization  and strengthening program; modalities PRN for pain.    Consulted and Agree with Plan of Care  Patient       Patient will benefit from skilled therapeutic intervention in order to improve the following deficits and impairments:  Abnormal gait, Decreased balance,  Difficulty walking, Decreased strength, Pain  Visit Diagnosis: Left knee pain, unspecified chronicity  Difficulty in walking, not elsewhere classified     Problem List Patient Active Problem List   Diagnosis Date Noted  . Medial meniscus tear 05/05/2017    Marvell Fuller, PTA 06/04/2017, 9:09 AM  [facility] [address] [place], [place], [address] Phone: [phone]   Fax:  [phone]  Name: Roberto Lynch MRN: [id number] Date of Birth: 03-Feb-1956

## 2017-06-08 ENCOUNTER — Ambulatory Visit: Payer: 59 | Admitting: Physical Therapy

## 2017-06-08 DIAGNOSIS — M25562 Pain in left knee: Secondary | ICD-10-CM | POA: Diagnosis not present

## 2017-06-08 DIAGNOSIS — R262 Difficulty in walking, not elsewhere classified: Secondary | ICD-10-CM

## 2017-06-08 NOTE — Therapy (Signed)
[facility] [address] [place], [place], [address] Phone: [phone]   Fax:  [phone]  Physical Therapy Treatment  Patient Details  Name: Roberto Lynch MRN: [id number] Date of Birth: Feb 11, 1956 Referring Provider: Thurston Hole   Encounter Date: 06/08/2017  PT End of Session - 06/08/17 0824    Visit Number  5    Number of Visits  12    Date for PT Re-Evaluation  07/07/17    Authorization Type  FOTO every 5th visit    PT Start Time  0823    PT Stop Time  0914    PT Time Calculation (min)  51 min    Activity Tolerance  Patient tolerated treatment well    Behavior During Therapy  [facility] for tasks assessed/performed       Past Medical History:  Diagnosis Date  . Guillain Barr syndrome (HCC)   . Hypertension     Past Surgical History:  Procedure Laterality Date  . CHOLECYSTECTOMY  1999  . TONSILLECTOMY  1969    There were no vitals filed for this visit.  Subjective Assessment - 06/08/17 0829    Subjective  Patient reports 2-3/10 pain today. Patient reports has 5/10 pain toward end of day, while walking community distances, and kneeling to interact with grandchild.    Pertinent History  COPD, HTN    Limitations  Walking;Standing    How long can you stand comfortably?  30 mins - 1hr    Diagnostic tests  MRI, x-ray    Patient Stated Goals  walk long distances, crawl, go up and down ladders, get back to where I was before    Currently in Pain?  Yes    Pain Score  3     Pain Location  Knee    Pain Orientation  Left;Medial;Lateral    Pain Descriptors / Indicators  Sore    Pain Type  Acute pain    Pain Onset  1 to 4 weeks ago    Pain Frequency  Constant         [facility] PT Assessment - 06/08/17 0001      Assessment   Medical Diagnosis  L knee medial and lateral menisectomy and chondroplasty    Next MD Visit  June 12, 2017    Prior Therapy  none      Restrictions   Weight Bearing Restrictions  No      Observation/Other  Assessments   Focus on Therapeutic Outcomes (FOTO)   54% limitation                  [facility] Adult PT Treatment/Exercise - 06/08/17 0001      Knee/Hip Exercises: Aerobic   Nustep  L6 x10 min      Knee/Hip Exercises: Machines for Strengthening   Cybex Knee Extension  20# 3x10 reps    Cybex Knee Flexion  40# 3x10 reps      Knee/Hip Exercises: Standing   Rocker Board  1 minute PF/DF and IN/EV with slight bend to knees      Knee/Hip Exercises: Supine   Bridges with [name]  Strengthening;Both;20 reps    Straight Leg Raises  Strengthening;Both;20 reps    Straight Leg Raise with External Rotation  Strengthening;Left;20 reps      Knee/Hip Exercises: Sidelying   Hip ABduction  AROM;Left;20 reps      Modalities   Modalities  Vasopneumatic      Vasopneumatic   Number Minutes Vasopneumatic   15 minutes  Vasopnuematic Location   Knee    Vasopneumatic Pressure  Medium    Vasopneumatic Temperature   34               PT Short Term Goals - 05/26/17 1153      PT SHORT TERM GOAL #1   Title  STG=LTG        PT Long Term Goals - 05/26/17 1154      PT LONG TERM GOAL #1   Title  Patient will improve L knee flexion strength to 5/5 to better perform functional activities.    Time  6    Period  Weeks    Status  New    Target Date  07/07/17      PT LONG TERM GOAL #2   Title  Patient will improve L SLS to greater than or equal to 10 seconds.    Time  6    Period  Weeks    Status  New    Target Date  07/07/17      PT LONG TERM GOAL #3   Title  Patient will self report ability to walk community distances with less than or equal to 1/10 L knee pain in order to perform work duties.    Time  6    Period  Weeks    Status  New    Target Date  07/07/17      PT LONG TERM GOAL #4   Title  Patient will be able to tolerate kneeling and quadruped position for greater than 30 minutes with less than or equal to 2/10 L knee pain in order to perform crawling for work  activities.    Time  6    Period  Weeks    Status  New    Target Date  07/07/17      PT LONG TERM GOAL #5   Title  Patient will be able to stand with equal LE weight distribution for greater than 1 hour with less than or equal to 1/10 pain to perform household activities.     Time  6    Period  Weeks    Status  New    Target Date  07/07/17      Additional Long Term Goals   Additional Long Term Goals  Yes      PT LONG TERM GOAL #6   Title  Patient will self report no pain with ascending and descending a ladder with reciprocating gait pattern in order to perform work activities.     Time  6    Period  Weeks    Status  New    Target Date  07/07/17            Plan - 06/08/17 0835    Clinical Impression Statement  Patient continues to demonstrate antalgic gait pattern, decreased L stance time and decreased R step length. Patient noted with no increase of pain during exercises. Patient reported lateral rockerboard balance exercise more difficult than anterior/posterior. No adverse affects noted upon removal of modalities.    Clinical Presentation  Stable    Clinical Decision Making  Low    Rehab Potential  Excellent    Clinical Impairments Affecting Rehab Potential  COPD, HTN    PT Frequency  2x / week    PT Duration  6 weeks    PT Treatment/Interventions  ADLs/Self Care Home Management;Electrical Stimulation;Iontophoresis 4mg /ml Dexamethasone;Moist Heat;Cryotherapy;Ultrasound;Therapeutic exercise;Therapeutic activities;Functional mobility training;Stair training;Gait training;Balance training;Neuromuscular re-education;Patient/family education;Manual techniques;Passive range of  motion;Dry needling;Vasopneumatic Device    PT Next Visit Plan  Begin knee stabilization and strengthening program; modalities PRN for pain.    Consulted and Agree with Plan of Care  Patient       Patient will benefit from skilled therapeutic intervention in order to improve the following deficits and  impairments:  Abnormal gait, Decreased balance, Difficulty walking, Decreased strength, Pain  Visit Diagnosis: Left knee pain, unspecified chronicity  Difficulty in walking, not elsewhere classified     Problem List Patient Active Problem List   Diagnosis Date Noted  . Medial meniscus tear 05/05/2017   Guss Bunde, PT, DPT 06/08/2017, 9:24 AM  [facility] [address] [place], [place], [address] Phone: [phone]   Fax:  [phone]  Name: Roberto Lynch MRN: [id number] Date of Birth: 03-08-1956

## 2017-06-11 ENCOUNTER — Encounter: Payer: Self-pay | Admitting: Physical Therapy

## 2017-06-11 ENCOUNTER — Ambulatory Visit: Payer: 59 | Admitting: Physical Therapy

## 2017-06-11 DIAGNOSIS — M25562 Pain in left knee: Secondary | ICD-10-CM | POA: Diagnosis not present

## 2017-06-11 DIAGNOSIS — R262 Difficulty in walking, not elsewhere classified: Secondary | ICD-10-CM

## 2017-06-11 NOTE — Therapy (Signed)
[facility] [address] [place], [place], [address] Phone: [phone]   Fax:  [phone]  Physical Therapy Treatment  Patient Details  Name: Roberto Lynch MRN: [id number] Date of Birth: 1955-11-02 Referring Provider: Thurston Hole   Encounter Date: 06/11/2017  PT End of Session - 06/11/17 0738    Visit Number  6    Number of Visits  12    Date for PT Re-Evaluation  07/07/17    Authorization Type  FOTO every 5th visit    PT Start Time  0727    PT Stop Time  0800 2 units secondary to early dismissal for MD appt    PT Time Calculation (min)  33 min    Activity Tolerance  Patient tolerated treatment well    Behavior During Therapy  [facility] for tasks assessed/performed       Past Medical History:  Diagnosis Date  . Guillain Barr syndrome (HCC)   . Hypertension     Past Surgical History:  Procedure Laterality Date  . CHOLECYSTECTOMY  1999  . TONSILLECTOMY  1969    There were no vitals filed for this visit.  Subjective Assessment - 06/11/17 0734    Subjective  Reports his knee feels about the same. Reports that in the mornings pain is low but by afternoon pain has increased.    Pertinent History  COPD, HTN    Limitations  Walking;Standing    How long can you stand comfortably?  30 mins - 1hr    Diagnostic tests  MRI, x-ray    Patient Stated Goals  walk long distances, crawl, go up and down ladders, get back to where I was before    Currently in Pain?  Yes    Pain Score  2     Pain Location  Knee    Pain Orientation  Left    Pain Descriptors / Indicators  Discomfort    Pain Type  Acute pain    Pain Onset  1 to 4 weeks ago         [facility] PT Assessment - 06/11/17 0001      Assessment   Medical Diagnosis  L knee medial and lateral menisectomy and chondroplasty    Onset Date/Surgical Date  05/13/17    Next MD Visit  06-11-2017    Prior Therapy  none      Restrictions   Weight Bearing Restrictions  No      ROM / Strength    AROM / PROM / Strength  Strength      Strength   Overall Strength  Within functional limits for tasks performed    Strength Assessment Site  Knee    Right/Left Knee  Left    Left Knee Flexion  5/5    Left Knee Extension  5/5                  [facility] Adult PT Treatment/Exercise - 06/11/17 0001      Knee/Hip Exercises: Aerobic   Nustep  L6 x10 min      Knee/Hip Exercises: Machines for Strengthening   Cybex Knee Extension  20# 3x10 reps    Cybex Knee Flexion  40# 3x10 reps    Cybex Leg Press  3 pl, seat 7 x30 reps      Knee/Hip Exercises: Standing   Terminal Knee Extension  Strengthening;Left;20 reps;Limitations    Terminal Knee Extension Limitations  Orange XTS      Knee/Hip Exercises: Supine   Henreitta Leber  Strengthening;3 sets;10 reps    Straight Leg Raise with External Rotation  Strengthening;Left;3 sets;10 reps      Knee/Hip Exercises: Sidelying   Hip ABduction  AROM;Left;20 reps               PT Short Term Goals - 05/26/17 1153      PT SHORT TERM GOAL #1   Title  STG=LTG        PT Long Term Goals - 06/11/17 0800      PT LONG TERM GOAL #1   Title  Patient will improve L knee flexion strength to 5/5 to better perform functional activities.    Time  6    Period  Weeks    Status  Achieved      PT LONG TERM GOAL #2   Title  Patient will improve L SLS to greater than or equal to 10 seconds.    Time  6    Period  Weeks    Status  On-going      PT LONG TERM GOAL #3   Title  Patient will self report ability to walk community distances with less than or equal to 1/10 L knee pain in order to perform work duties.    Time  6    Period  Weeks    Status  On-going      PT LONG TERM GOAL #4   Title  Patient will be able to tolerate kneeling and quadruped position for greater than 30 minutes with less than or equal to 2/10 L knee pain in order to perform crawling for work activities.    Time  6    Period  Weeks    Status  On-going      PT LONG TERM GOAL  #5   Title  Patient will be able to stand with equal LE weight distribution for greater than 1 hour with less than or equal to 1/10 pain to perform household activities.     Time  6    Period  Weeks    Status  On-going      PT LONG TERM GOAL #6   Title  Patient will self report no pain with ascending and descending a ladder with reciprocating gait pattern in order to perform work activities.     Time  6    Period  Weeks    Status  On-going            Plan - 06/11/17 0803    Clinical Impression Statement  Patient presented in clinic with reports of continued pain especially with ambulation and with antalgic deviation. Patient able to complete all exercises directed today without complaint of pain. L knee MMT improved to 5/5 throughout. Patient reports avoiding stairs secondary to the pain experienced which has been assessed in clinic with varying heights of stairs. Goals remain on-going secondary to pain with ambulation, compensatory strategies noted with standing or ambulation as well as pain with stairs.    Rehab Potential  Excellent    Clinical Impairments Affecting Rehab Potential  COPD, HTN    PT Frequency  2x / week    PT Duration  6 weeks    PT Treatment/Interventions  ADLs/Self Care Home Management;Electrical Stimulation;Iontophoresis 4mg /ml Dexamethasone;Moist Heat;Cryotherapy;Ultrasound;Therapeutic exercise;Therapeutic activities;Functional mobility training;Stair training;Gait training;Balance training;Neuromuscular re-education;Patient/family education;Manual techniques;Passive range of motion;Dry needling;Vasopneumatic Device    PT Next Visit Plan  Begin knee stabilization and strengthening program; modalities PRN for pain.    Consulted and Agree with Plan of  Care  Patient       Patient will benefit from skilled therapeutic intervention in order to improve the following deficits and impairments:  Abnormal gait, Decreased balance, Difficulty walking, Decreased strength,  Pain  Visit Diagnosis: Left knee pain, unspecified chronicity  Difficulty in walking, not elsewhere classified     Problem List Patient Active Problem List   Diagnosis Date Noted  . Medial meniscus tear 05/05/2017    Marvell FullerKelsey P Alba Kriesel, PTA 06/11/2017, 8:10 AM  [facility] [address] [place], [place], [address] Phone: [phone]   Fax:  [phone]  Name: Roberto LivingMark A Lynch MRN: [id number] Date of Birth: 1955-08-27

## 2017-06-15 ENCOUNTER — Ambulatory Visit: Payer: 59 | Admitting: Physical Therapy

## 2017-06-15 ENCOUNTER — Encounter: Payer: Self-pay | Admitting: Physical Therapy

## 2017-06-15 DIAGNOSIS — M25562 Pain in left knee: Secondary | ICD-10-CM

## 2017-06-15 DIAGNOSIS — R262 Difficulty in walking, not elsewhere classified: Secondary | ICD-10-CM

## 2017-06-15 NOTE — Therapy (Signed)
[facility] [address] [place], [place], [address] Phone: [phone]   Fax:  [phone]  Physical Therapy Treatment  Patient Details  Name: Roberto Lynch MRN: [id number] Date of Birth: [birth year] Referring Provider: Thurston Hole   Encounter Date: 06/15/2017  PT End of Session - 06/15/17 0737    Visit Number  7    Number of Visits  12    Date for PT Re-Evaluation  07/07/17    Authorization Type  FOTO every 5th visit    PT Start Time  0732    PT Stop Time  0823    PT Time Calculation (min)  51 min    Activity Tolerance  Patient tolerated treatment well    Behavior During Therapy  [facility] for tasks assessed/performed       Past Medical History:  Diagnosis Date  . Guillain Barr syndrome (HCC)   . Hypertension     Past Surgical History:  Procedure Laterality Date  . CHOLECYSTECTOMY  1999  . TONSILLECTOMY  1969    There were no vitals filed for this visit.  Subjective Assessment - 06/15/17 0733    Subjective  Reports that MD increased PT to 3x/week and that he cannot cross his legs.    Pertinent History  COPD, HTN    Limitations  Walking;Standing    How long can you stand comfortably?  30 mins - 1hr    Diagnostic tests  MRI, x-ray    Patient Stated Goals  walk long distances, crawl, go up and down ladders, get back to where I was before    Currently in Pain?  Yes    Pain Score  2     Pain Location  Knee    Pain Orientation  Left    Pain Descriptors / Indicators  Discomfort    Pain Type  Acute pain    Pain Onset  1 to 4 weeks ago         [facility] PT Assessment - 06/15/17 0001      Assessment   Medical Diagnosis  L knee medial and lateral menisectomy and chondroplasty    Onset Date/Surgical Date  05/13/17    Next MD Visit  06/2017    Prior Therapy  none      Restrictions   Weight Bearing Restrictions  No                  [facility] Adult PT Treatment/Exercise - 06/15/17 0001      Knee/Hip Exercises: Stretches   Passive Hamstring Stretch  Left;3 reps;30 seconds more HS stretch    Piriformis Stretch  Left;3 reps;30 seconds Reports more discomfort in posterior L knee    Other Knee/Hip Stretches  L SKTC 3x30 sec      Knee/Hip Exercises: Aerobic   Nustep  L6 x10 min      Knee/Hip Exercises: Machines for Strengthening   Cybex Knee Extension  20# 3x10 reps    Cybex Knee Flexion  40# 3x10 reps    Cybex Leg Press  3 pl, seat 7 x30 reps      Knee/Hip Exercises: Supine   Straight Leg Raise with External Rotation  Strengthening;Left;20 reps      Knee/Hip Exercises: Sidelying   Hip ABduction  AROM;Left;20 reps      Modalities   Modalities  Vasopneumatic      Vasopneumatic   Number Minutes Vasopneumatic   15 minutes    Vasopnuematic Location   Knee    Vasopneumatic Pressure  Medium    Vasopneumatic Temperature   34               PT Short Term Goals - 05/26/17 1153      PT SHORT TERM GOAL #1   Title  STG=LTG        PT Long Term Goals - 06/11/17 0800      PT LONG TERM GOAL #1   Title  Patient will improve L knee flexion strength to 5/5 to better perform functional activities.    Time  6    Period  Weeks    Status  Achieved      PT LONG TERM GOAL #2   Title  Patient will improve L SLS to greater than or equal to 10 seconds.    Time  6    Period  Weeks    Status  On-going      PT LONG TERM GOAL #3   Title  Patient will self report ability to walk community distances with less than or equal to 1/10 L knee pain in order to perform work duties.    Time  6    Period  Weeks    Status  On-going      PT LONG TERM GOAL #4   Title  Patient will be able to tolerate kneeling and quadruped position for greater than 30 minutes with less than or equal to 2/10 L knee pain in order to perform crawling for work activities.    Time  6    Period  Weeks    Status  On-going      PT LONG TERM GOAL #5   Title  Patient will be able to stand with equal LE weight distribution for greater than 1  hour with less than or equal to 1/10 pain to perform household activities.     Time  6    Period  Weeks    Status  On-going      PT LONG TERM GOAL #6   Title  Patient will self report no pain with ascending and descending a ladder with reciprocating gait pattern in order to perform work activities.     Time  6    Period  Weeks    Status  On-going            Plan - 06/15/17 [id number]    Clinical Impression Statement  Patient presented in clinic with continued reports of pain with ambulation as well as hip tightness due to not being able to cross his LEs. Patient guided through resisted knee strengthening with only minimal knee discomfort reported by patient. Patient guided through various L hip stretches as well due to limitations with crossing LEs. Patient experienced greatest stretch with L HS stretch per patient report and posterior knee discomfort with figure 4 Piriformis stretch. Normal vasopneumatic response noted following removal of the modality.    Rehab Potential  Excellent    Clinical Impairments Affecting Rehab Potential  COPD, HTN    PT Frequency  2x / week    PT Duration  6 weeks    PT Treatment/Interventions  ADLs/Self Care Home Management;Electrical Stimulation;Iontophoresis 4mg /ml Dexamethasone;Moist Heat;Cryotherapy;Ultrasound;Therapeutic exercise;Therapeutic activities;Functional mobility training;Stair training;Gait training;Balance training;Neuromuscular re-education;Patient/family education;Manual techniques;Passive range of motion;Dry needling;Vasopneumatic Device    PT Next Visit Plan  Begin knee stabilization and strengthening program; modalities PRN for pain.    Consulted and Agree with Plan of Care  Patient       Patient will benefit from skilled therapeutic  intervention in order to improve the following deficits and impairments:  Abnormal gait, Decreased balance, Difficulty walking, Decreased strength, Pain  Visit Diagnosis: Left knee pain, unspecified  chronicity  Difficulty in walking, not elsewhere classified     Problem List Patient Active Problem List   Diagnosis Date Noted  . Medial meniscus tear 05/05/2017    Marvell Fuller, PTA 06/15/2017, 8:35 AM  [facility] [address] [place], [place], [address] Phone: [phone]   Fax:  [phone]  Name: Roberto Lynch MRN: [id number] Date of Birth: 07-15-1955

## 2017-06-17 ENCOUNTER — Encounter: Payer: Self-pay | Admitting: Physical Therapy

## 2017-06-17 ENCOUNTER — Ambulatory Visit: Payer: 59 | Admitting: Physical Therapy

## 2017-06-17 DIAGNOSIS — M25562 Pain in left knee: Secondary | ICD-10-CM | POA: Diagnosis not present

## 2017-06-17 DIAGNOSIS — R262 Difficulty in walking, not elsewhere classified: Secondary | ICD-10-CM

## 2017-06-17 NOTE — Therapy (Signed)
[facility] [address] [place], [place], [address] Phone: [phone]   Fax:  [phone]  Physical Therapy Treatment  Patient Details  Name: Roberto Lynch MRN: [id number] Date of Birth: [birth year] Referring Provider: Thurston HoleWainer   Encounter Date: 06/17/2017  PT End of Session - 06/17/17 0736    Visit Number  8    Number of Visits  12    Date for PT Re-Evaluation  07/07/17    Authorization Type  FOTO every 5th visit    PT Start Time  0733    PT Stop Time  0827    PT Time Calculation (min)  54 min    Activity Tolerance  Patient tolerated treatment well    Behavior During Therapy  [facility] for tasks assessed/performed       Past Medical History:  Diagnosis Date  . Guillain Barr syndrome (HCC)   . Hypertension     Past Surgical History:  Procedure Laterality Date  . CHOLECYSTECTOMY  1999  . TONSILLECTOMY  1969    There were no vitals filed for this visit.  Subjective Assessment - 06/17/17 0735    Subjective  Reports that after last treatment his knee felt better until late yesterday evening and now has a little discomfort.    Pertinent History  COPD, HTN    Limitations  Walking;Standing    How long can you stand comfortably?  30 mins - 1hr    Diagnostic tests  MRI, x-ray    Patient Stated Goals  walk long distances, crawl, go up and down ladders, get back to where I was before    Currently in Pain?  Yes    Pain Score  2     Pain Location  Knee    Pain Orientation  Left    Pain Descriptors / Indicators  Discomfort    Pain Type  Acute pain    Pain Onset  1 to 4 weeks ago         [facility] 06/17[facility]17 0001      Assessment   Medical Diagnosis  L knee medial and lateral menisectomy and chondroplasty    Onset Date/Surgical Date  05/13/17    Next MD Visit  06/2017    Prior Therapy  none      Restrictions   Weight Bearing Restrictions  No                  [facility] Adult PT Treatment/Exercise - 06/17/17  0001      Knee/Hip Exercises: Stretches   Piriformis Stretch  Left;3 reps;30 seconds      Knee/Hip Exercises: Aerobic   Nustep  L6 x10 min      Knee/Hip Exercises: Machines for Strengthening   Cybex Knee Extension  20# 3x10 reps    Cybex Knee Flexion  40# 3x10 reps    Cybex Leg Press  3 pl, seat 7 x30 reps      Knee/Hip Exercises: Standing   Lateral Step Up  Left;2 sets;10 reps;Hand Hold: 2;Step Height: 6"    Forward Step Up  Left;2 sets;10 reps;Hand Hold: 2;Step Height: 6"      Knee/Hip Exercises: Supine   Straight Leg Raise with External Rotation  Strengthening;Left;20 reps      Knee/Hip Exercises: Sidelying   Hip ABduction  AROM;Left;20 reps      Modalities   Modalities  Vasopneumatic      Vasopneumatic   Number Minutes Vasopneumatic   15 minutes  Vasopnuematic Location   Knee    Vasopneumatic Pressure  Medium    Vasopneumatic Temperature   34               PT Short Term Goals - 05/26/17 1153      PT SHORT TERM GOAL #1   Title  STG=LTG        PT Long Term Goals - 06/11/17 0800      PT LONG TERM GOAL #1   Title  Patient will improve L knee flexion strength to 5/5 to better perform functional activities.    Time  6    Period  Weeks    Status  Achieved      PT LONG TERM GOAL #2   Title  Patient will improve L SLS to greater than or equal to 10 seconds.    Time  6    Period  Weeks    Status  On-going      PT LONG TERM GOAL #3   Title  Patient will self report ability to walk community distances with less than or equal to 1/10 L knee pain in order to perform work duties.    Time  6    Period  Weeks    Status  On-going      PT LONG TERM GOAL #4   Title  Patient will be able to tolerate kneeling and quadruped position for greater than 30 minutes with less than or equal to 2/10 L knee pain in order to perform crawling for work activities.    Time  6    Period  Weeks    Status  On-going      PT LONG TERM GOAL #5   Title  Patient will be able to  stand with equal LE weight distribution for greater than 1 hour with less than or equal to 1/10 pain to perform household activities.     Time  6    Period  Weeks    Status  On-going      PT LONG TERM GOAL #6   Title  Patient will self report no pain with ascending and descending a ladder with reciprocating gait pattern in order to perform work activities.     Time  6    Period  Weeks    Status  On-going            Plan - 06/17/17 0831    Clinical Impression Statement  Patient presented in clinic with low level L knee discomfort. No complaints were reported by patient during treatment other than 3/10 L knee discomfort with step ups and patient able to experience L posterior knee stretch with figure 4 stretch in supine. Normal modalities response noted following removal of the modalities.    Rehab Potential  Excellent    Clinical Impairments Affecting Rehab Potential  COPD, HTN    PT Frequency  2x / week    PT Duration  6 weeks    PT Treatment/Interventions  ADLs/Self Care Home Management;Electrical Stimulation;Iontophoresis 4mg /ml Dexamethasone;Moist Heat;Cryotherapy;Ultrasound;Therapeutic exercise;Therapeutic activities;Functional mobility training;Stair training;Gait training;Balance training;Neuromuscular re-education;Patient/family education;Manual techniques;Passive range of motion;Dry needling;Vasopneumatic Device    PT Next Visit Plan  Begin knee stabilization and strengthening program; modalities PRN for pain.    Consulted and Agree with Plan of Care  Patient       Patient will benefit from skilled therapeutic intervention in order to improve the following deficits and impairments:  Abnormal gait, Decreased balance, Difficulty walking, Decreased strength, Pain  Visit Diagnosis: Left knee pain, unspecified chronicity  Difficulty in walking, not elsewhere classified     Problem List Patient Active Problem List   Diagnosis Date Noted  . Medial meniscus tear 05/05/2017     Roberto Lynch, Roberto Lynch 06/17/2017, 8:41 AM  [facility] [address] [place], [place], [address] Phone: [phone]   Fax:  [phone]  Name: Roberto Lynch MRN: [id number] Date of Birth: Jul 29, 1955

## 2017-06-18 ENCOUNTER — Encounter: Payer: 59 | Admitting: Physical Therapy

## 2017-06-19 ENCOUNTER — Ambulatory Visit: Payer: 59 | Attending: Orthopedic Surgery | Admitting: *Deleted

## 2017-06-19 DIAGNOSIS — M25562 Pain in left knee: Secondary | ICD-10-CM

## 2017-06-19 DIAGNOSIS — R262 Difficulty in walking, not elsewhere classified: Secondary | ICD-10-CM | POA: Insufficient documentation

## 2017-06-19 NOTE — Therapy (Signed)
[facility] [address] [place], [place], [address] Phone: [phone]   Fax:  [phone]  Physical Therapy Treatment  Patient Details  Name: Roberto Lynch MRN: [id number] Date of Birth: [birth year] Referring Provider: Thurston Hole   Encounter Date: 06/19/2017  PT End of Session - 06/19/17 0833    Visit Number  9    Number of Visits  12    Date for PT Re-Evaluation  07/07/17    Authorization Type  FOTO every 5th visit    PT Start Time  0815    PT Stop Time  0905    PT Time Calculation (min)  50 min    Activity Tolerance  Patient tolerated treatment well    Behavior During Therapy  [facility] for tasks assessed/performed       Past Medical History:  Diagnosis Date  . Guillain Barr syndrome (HCC)   . Hypertension     Past Surgical History:  Procedure Laterality Date  . CHOLECYSTECTOMY  1999  . TONSILLECTOMY  1969    There were no vitals filed for this visit.  Subjective Assessment - 06/19/17 0829    Subjective  Doing better overall atleast 50-60% better LT knee    Pertinent History  COPD, HTN    Limitations  Walking;Standing    How long can you stand comfortably?  30 mins - 1hr    Diagnostic tests  MRI, x-ray    Patient Stated Goals  walk long distances, crawl, go up and down ladders, get back to where I was before    Currently in Pain?  Yes    Pain Score  2     Pain Location  Knee    Pain Descriptors / Indicators  Discomfort    Pain Type  Acute pain    Pain Onset  1 to 4 weeks ago    Pain Frequency  Constant                      [facility] Adult PT Treatment/Exercise - 06/19/17 0001      Knee/Hip Exercises: Stretches   Piriformis Stretch  Left;3 reps;30 seconds      Knee/Hip Exercises: Aerobic   Nustep  L6 x10 min      Knee/Hip Exercises: Machines for Strengthening   Cybex Knee Extension  20# 3x10 reps    Cybex Knee Flexion  40# 3x10 reps    Cybex Leg Press  3 pl, seat 7 x30 reps      Knee/Hip Exercises:  Standing   Lateral Step Up  Left;2 sets;10 reps;Hand Hold: 2;Step Height: 6"    Forward Step Up  Left;2 sets;10 reps;Hand Hold: 2;Step Height: 6"      Knee/Hip Exercises: Supine   Straight Leg Raise with External Rotation  Strengthening;Left;20 reps      Knee/Hip Exercises: Sidelying   Hip ABduction  AROM;Left;20 reps      Modalities   Modalities  Vasopneumatic      Vasopneumatic   Number Minutes Vasopneumatic   15 minutes    Vasopnuematic Location   Knee    Vasopneumatic Pressure  Medium    Vasopneumatic Temperature   34               PT Short Term Goals - 05/26/17 1153      PT SHORT TERM GOAL #1   Title  STG=LTG        PT Long Term Goals - 06/11/17 0800      PT  LONG TERM GOAL #1   Title  Patient will improve L knee flexion strength to 5/5 to better perform functional activities.    Time  6    Period  Weeks    Status  Achieved      PT LONG TERM GOAL #2   Title  Patient will improve L SLS to greater than or equal to 10 seconds.    Time  6    Period  Weeks    Status  On-going      PT LONG TERM GOAL #3   Title  Patient will self report ability to walk community distances with less than or equal to 1/10 L knee pain in order to perform work duties.    Time  6    Period  Weeks    Status  On-going      PT LONG TERM GOAL #4   Title  Patient will be able to tolerate kneeling and quadruped position for greater than 30 minutes with less than or equal to 2/10 L knee pain in order to perform crawling for work activities.    Time  6    Period  Weeks    Status  On-going      PT LONG TERM GOAL #5   Title  Patient will be able to stand with equal LE weight distribution for greater than 1 hour with less than or equal to 1/10 pain to perform household activities.     Time  6    Period  Weeks    Status  On-going      PT LONG TERM GOAL #6   Title  Patient will self report no pain with ascending and descending a ladder with reciprocating gait pattern in order to  perform work activities.     Time  6    Period  Weeks    Status  On-going            Plan - 06/19/17 [id number]    Clinical Impression Statement  Pt arrived today doing fairly well with min LT knee pain. He reports he is atleast 50-60% better overall. He reports descending steps and kneeling on his kneee is the most painful and challenging. Rx focused on strengthening OKC and CKC and hip stretching. He did well and was able to perform all act.'s with minimal pain increase. Normal  response toVaso    Clinical Presentation  Stable    Clinical Decision Making  Low    Clinical Impairments Affecting Rehab Potential  COPD, HTN    PT Frequency  2x / week    PT Duration  6 weeks    PT Treatment/Interventions  ADLs/Self Care Home Management;Electrical Stimulation;Iontophoresis 4mg /ml Dexamethasone;Moist Heat;Cryotherapy;Ultrasound;Therapeutic exercise;Therapeutic activities;Functional mobility training;Stair training;Gait training;Balance training;Neuromuscular re-education;Patient/family education;Manual techniques;Passive range of motion;Dry needling;Vasopneumatic Device    PT Next Visit Plan  Begin knee stabilization and strengthening program; modalities PRN for pain.    Consulted and Agree with Plan of Care  Patient       Patient will benefit from skilled therapeutic intervention in order to improve the following deficits and impairments:  Abnormal gait, Decreased balance, Difficulty walking, Decreased strength, Pain  Visit Diagnosis: Left knee pain, unspecified chronicity  Difficulty in walking, not elsewhere classified     Problem List Patient Active Problem List   Diagnosis Date Noted  . Medial meniscus tear 05/05/2017    Caron Tardif,CHRIS, PTA 06/19/2017, 9:43 AM  [facility] [address] [place], [place], [address] Phone:  [phone]   Fax:  [phone]  Name: Roberto Lynch MRN: [id number] Date of Birth: Sep 16, 1955

## 2017-06-22 ENCOUNTER — Ambulatory Visit: Payer: 59 | Admitting: Physical Therapy

## 2017-06-22 DIAGNOSIS — R262 Difficulty in walking, not elsewhere classified: Secondary | ICD-10-CM

## 2017-06-22 DIAGNOSIS — M25562 Pain in left knee: Secondary | ICD-10-CM | POA: Diagnosis not present

## 2017-06-22 NOTE — Patient Instructions (Signed)
  ABDUCTION: Standing (Active)   Stand, feet flat. Lift right leg out to side.  Complete __10_ repetitions. Do 1-3 sets of 10.  Perform __2_ sessions per day. Do both sides.  Single Leg Balance: Eyes Open    Stand on right leg with eyes open. Hold as long as possible. START WITH ONE HAND SUPPORT, THEN DROP TO TWO FINGERS, THEN NO SUPPORT _5-10__ reps 1__ times per day. Do both sides.  http://ggbe.exer.us/4   Copyright  VHI. All rights reserved.   Solon PalmJulie Ryelynn Guedea, PT 06/22/17 8:50 AM; [facility] [address] [place], [place], [address] Phone: [phone]   Fax:  [phone]

## 2017-06-22 NOTE — Therapy (Signed)
[facility] [address] [place], [place], [address] Phone: [phone]   Fax:  [phone]  Physical Therapy Treatment  Patient Details  Name: Roberto Lynch MRN: [id number] Date of Birth: 10-Jul-1955 Referring Provider: Thurston Hole   Encounter Date: 06/22/2017  PT End of Session - 06/22/17 0854    Visit Number  10    Number of Visits  12    Date for PT Re-Evaluation  07/07/17    Authorization Type  FOTO every 5th visit    PT Start Time  0815    PT Stop Time  0908    PT Time Calculation (min)  53 min    Activity Tolerance  Patient tolerated treatment well    Behavior During Therapy  [facility] for tasks assessed/performed       Past Medical History:  Diagnosis Date  . Guillain Barr syndrome (HCC)   . Hypertension     Past Surgical History:  Procedure Laterality Date  . CHOLECYSTECTOMY  1999  . TONSILLECTOMY  1969    There were no vitals filed for this visit.  Subjective Assessment - 06/22/17 0816    Subjective  The knee keeps getting better.  Biggest problem is getting on his knees for example when playing with his grandchild.    Pertinent History  COPD, HTN    Patient Stated Goals  walk long distances, crawl, go up and down ladders, get back to where I was before    Currently in Pain?  Yes    Pain Score  1     Pain Location  Knee    Pain Orientation  Left    Pain Descriptors / Indicators  Discomfort         [facility] PT Assessment - 06/22/17 0001      Observation/Other Assessments   Focus on Therapeutic Outcomes (FOTO)   49% limited                  [facility] Adult PT Treatment/Exercise - 06/22/17 0001      Ambulation/Gait   Ambulation/Gait  Yes    Ambulation/Gait Assistance  7: Independent    Ambulation Distance (Feet)  240 Feet    Assistive device  None    Gait Pattern  Decreased stance time - left;Lateral trunk lean to left    Ambulation Surface  Level    Pre-Gait Activities  exaggerated steppage to increase  stance time on left and decrease trunk lean    Gait Comments  used mirror for visual cues      Knee/Hip Exercises: Aerobic   Nustep  L6 x10 min      Knee/Hip Exercises: Standing   Terminal Knee Extension  Strengthening;Left;20 reps;Theraband 5 sec hold    Theraband Level (Terminal Knee Extension)  Level 3 (Green)    Hip Abduction  Stengthening;Both;2 sets;10 reps    Lateral Step Up  Left;2 sets;10 reps;Hand Hold: 0;Step Height: 8"    Forward Step Up  Left;2 sets;10 reps;Step Height: 8";Hand Hold: 0    SLS  multiple reps starting with one UE support, then two fingers then Ind; 9 sec x 2 on L the longest  RLE SLS 3 sec max    Other Standing Knee Exercises  step ups on foam x 10 Bil, standing on foam eyes closed x 30 sec (WNL)      Modalities   Modalities  Vasopneumatic      Vasopneumatic   Number Minutes Vasopneumatic   15 minutes    Vasopnuematic  Location   Knee    Vasopneumatic Pressure  Medium    Vasopneumatic Temperature   34               PT Short Term Goals - 05/26/17 1153      PT SHORT TERM GOAL #1   Title  STG=LTG        PT Long Term Goals - 06/22/17 0820      PT LONG TERM GOAL #1   Title  Patient will improve L knee flexion strength to 5/5 to better perform functional activities.    Period  Weeks    Status  Achieved      PT LONG TERM GOAL #2   Title  Patient will improve L SLS to greater than or equal to 10 seconds.    Baseline  9 seconds    Time  6    Period  Weeks    Status  On-going      PT LONG TERM GOAL #3   Title  Patient will self report ability to walk community distances with less than or equal to 1/10 L knee pain in order to perform work duties.    Baseline  4/10 after grocery shopping    Time  6    Period  Weeks    Status  On-going      PT LONG TERM GOAL #4   Title  Patient will be able to tolerate kneeling and quadruped position for greater than 30 minutes with less than or equal to 2/10 L knee pain in order to perform crawling for  work activities.    Baseline  4/10 with quadriped position    Time  6    Period  Weeks    Status  On-going      PT LONG TERM GOAL #5   Title  Patient will be able to stand with equal LE weight distribution for greater than 1 hour with less than or equal to 1/10 pain to perform household activities.     Time  6    Period  Weeks    Status  On-going      PT LONG TERM GOAL #6   Title  Patient will self report no pain with ascending and descending a ladder with reciprocating gait pattern in order to perform work activities.     Baseline  has not attempted    Time  6    Period  Weeks    Status  On-going            Plan - 06/22/17 [id number]    Clinical Impression Statement  Patient did well today with TE. He still has significant gait deviations which are somewhat corrected using visual cues via a mirror. He reports his pain in knee comes during terminal stance on L. He has continued balance deficits, R> L and left improved quickly with repeated efforts. He also demos greater weakness in R hip ABD vs. R. He is progressing with LTGs.    Rehab Potential  Excellent    Clinical Impairments Affecting Rehab Potential  COPD, HTN    PT Frequency  2x / week    PT Duration  6 weeks    PT Treatment/Interventions  ADLs/Self Care Home Management;Electrical Stimulation;Iontophoresis 4mg /ml Dexamethasone;Moist Heat;Cryotherapy;Ultrasound;Therapeutic exercise;Therapeutic activities;Functional mobility training;Stair training;Gait training;Balance training;Neuromuscular re-education;Patient/family education;Manual techniques;Passive range of motion;Dry needling;Vasopneumatic Device    PT Next Visit Plan  Continue gait and balance activities; progress strengthening program; modalities PRN for pain.    PT  Home Exercise Plan  standing hip ABD, SLS    Consulted and Agree with Plan of Care  Patient       Patient will benefit from skilled therapeutic intervention in order to improve the following deficits and  impairments:  Abnormal gait, Decreased balance, Difficulty walking, Decreased strength, Pain  Visit Diagnosis: Left knee pain, unspecified chronicity  Difficulty in walking, not elsewhere classified     Problem List Patient Active Problem List   Diagnosis Date Noted  . Medial meniscus tear 05/05/2017    Solon Palm PT 06/22/2017, 9:08 AM  [facility] [address] [place], [place], [address] Phone: [phone]   Fax:  [phone]  Name: RUNE MENDEZ MRN: [id number] Date of Birth: 1955-12-22

## 2017-06-24 ENCOUNTER — Ambulatory Visit: Payer: 59 | Admitting: Physical Therapy

## 2017-06-24 ENCOUNTER — Encounter: Payer: Self-pay | Admitting: Physical Therapy

## 2017-06-24 DIAGNOSIS — M25562 Pain in left knee: Secondary | ICD-10-CM

## 2017-06-24 DIAGNOSIS — R262 Difficulty in walking, not elsewhere classified: Secondary | ICD-10-CM

## 2017-06-24 NOTE — Therapy (Signed)
[facility] [address] [place], [place], [address] Phone: [phone]   Fax:  [phone]  Physical Therapy Treatment  Patient Details  Name: Roberto Lynch MRN: [id number] Date of Birth: 1955-08-07 Referring Provider: Thurston Hole   Encounter Date: 06/24/2017  PT End of Session - 06/24/17 0822    Visit Number  11    Number of Visits  12    Date for PT Re-Evaluation  07/07/17    Authorization Type  FOTO every 5th visit    PT Start Time  0818    PT Stop Time  0908    PT Time Calculation (min)  50 min    Activity Tolerance  Patient tolerated treatment well    Behavior During Therapy  [facility] for tasks assessed/performed       Past Medical History:  Diagnosis Date  . Guillain Barr syndrome (HCC)   . Hypertension     Past Surgical History:  Procedure Laterality Date  . CHOLECYSTECTOMY  1999  . TONSILLECTOMY  1969    There were no vitals filed for this visit.  Subjective Assessment - 06/24/17 0819    Subjective  "It's still there." Reports improvement with pain but still has discomfort with weightbearing with each step as well as kneeling.    Pertinent History  COPD, HTN    Limitations  Walking;Standing    How long can you stand comfortably?  30 mins - 1hr    Diagnostic tests  MRI, x-ray    Patient Stated Goals  walk long distances, crawl, go up and down ladders, get back to where I was before    Currently in Pain?  Yes    Pain Score  1     Pain Location  Knee    Pain Orientation  Left    Pain Descriptors / Indicators  Discomfort    Pain Type  Acute pain    Pain Onset  1 to 4 weeks ago         [facility] PT Assessment - 06/24/17 0001      Assessment   Medical Diagnosis  L knee medial and lateral menisectomy and chondroplasty    Onset Date/Surgical Date  05/13/17    Next MD Visit  06/2017    Prior Therapy  none      Restrictions   Weight Bearing Restrictions  No                  [facility] Adult PT Treatment/Exercise  - 06/24/17 0001      Knee/Hip Exercises: Aerobic   Nustep  L6 x10 min      Knee/Hip Exercises: Machines for Strengthening   Cybex Knee Extension  20# 3x10 reps    Cybex Knee Flexion  40# 3x10 reps    Cybex Leg Press  3 pl, seat 7 x30 reps      Knee/Hip Exercises: Standing   Terminal Knee Extension  Strengthening;Left;20 reps;Theraband    Theraband Level (Terminal Knee Extension)  Other (comment) Orange XTS    Hip Abduction  Stengthening;Left;3 sets;10 reps;Knee straight    Lateral Step Up  Left;3 sets;10 reps;Hand Hold: 2;Step Height: 8"    Forward Step Up  Left;3 sets;10 reps;Hand Hold: 2;Step Height: 8"    Wall Squat  2 sets;10 reps      Modalities   Modalities  Vasopneumatic      Vasopneumatic   Number Minutes Vasopneumatic   15 minutes    Vasopnuematic Location   Knee  Vasopneumatic Pressure  Medium    Vasopneumatic Temperature   34               PT Short Term Goals - 05/26/17 1153      PT SHORT TERM GOAL #1   Title  STG=LTG        PT Long Term Goals - 06/22/17 0820      PT LONG TERM GOAL #1   Title  Patient will improve L knee flexion strength to 5/5 to better perform functional activities.    Period  Weeks    Status  Achieved      PT LONG TERM GOAL #2   Title  Patient will improve L SLS to greater than or equal to 10 seconds.    Baseline  9 seconds    Time  6    Period  Weeks    Status  On-going      PT LONG TERM GOAL #3   Title  Patient will self report ability to walk community distances with less than or equal to 1/10 L knee pain in order to perform work duties.    Baseline  4/10 after grocery shopping    Time  6    Period  Weeks    Status  On-going      PT LONG TERM GOAL #4   Title  Patient will be able to tolerate kneeling and quadruped position for greater than 30 minutes with less than or equal to 2/10 L knee pain in order to perform crawling for work activities.    Baseline  4/10 with quadriped position    Time  6    Period  Weeks     Status  On-going      PT LONG TERM GOAL #5   Title  Patient will be able to stand with equal LE weight distribution for greater than 1 hour with less than or equal to 1/10 pain to perform household activities.     Time  6    Period  Weeks    Status  On-going      PT LONG TERM GOAL #6   Title  Patient will self report no pain with ascending and descending a ladder with reciprocating gait pattern in order to perform work activities.     Baseline  has not attempted    Time  6    Period  Weeks    Status  On-going            Plan - 06/24/17 0857    Clinical Impression Statement  Patient tolerated today's treatment well although he still has pain with weightbearing during ambulation. Patient guided through more weightbearing exercises such as wall squats with patient reporting "a little" pain but still able to continue. Increased reps instructed today to enhance strengthening. VCs provided throughout treatment for technique corrections. Patient still limited with kneeling activities but not limited with standing activities until the end of the day. Normal vasopnuematic response noted following end of the treatment.    Rehab Potential  Excellent    Clinical Impairments Affecting Rehab Potential  COPD, HTN    PT Frequency  2x / week    PT Duration  6 weeks    PT Treatment/Interventions  ADLs/Self Care Home Management;Electrical Stimulation;Iontophoresis 4mg /ml Dexamethasone;Moist Heat;Cryotherapy;Ultrasound;Therapeutic exercise;Therapeutic activities;Functional mobility training;Stair training;Gait training;Balance training;Neuromuscular re-education;Patient/family education;Manual techniques;Passive range of motion;Dry needling;Vasopneumatic Device    PT Next Visit Plan  Continue gait and balance activities; progress strengthening program; modalities PRN for  pain.    PT Home Exercise Plan  standing hip ABD, SLS    Consulted and Agree with Plan of Care  Patient       Patient will  benefit from skilled therapeutic intervention in order to improve the following deficits and impairments:  Abnormal gait, Decreased balance, Difficulty walking, Decreased strength, Pain  Visit Diagnosis: Left knee pain, unspecified chronicity  Difficulty in walking, not elsewhere classified     Problem List Patient Active Problem List   Diagnosis Date Noted  . Medial meniscus tear 05/05/2017    Marvell FullerKelsey P Aubryn Spinola, PTA 06/24/2017, 9:13 AM  [facility] [address] [place], [place], [address] Phone: [phone]   Fax:  [phone]  Name: Roberto LivingMark A Lynch MRN: [id number] Date of Birth: November 08, 1955

## 2017-06-26 ENCOUNTER — Encounter: Payer: Self-pay | Admitting: Physical Therapy

## 2017-06-26 ENCOUNTER — Ambulatory Visit: Payer: 59 | Admitting: Physical Therapy

## 2017-06-26 DIAGNOSIS — M25562 Pain in left knee: Secondary | ICD-10-CM

## 2017-06-26 DIAGNOSIS — R262 Difficulty in walking, not elsewhere classified: Secondary | ICD-10-CM

## 2017-06-26 NOTE — Therapy (Signed)
[facility] [address] [place], [place], [address] Phone: [phone]   Fax:  [phone]  Physical Therapy Treatment  Patient Details  Name: Roberto Lynch MRN: [id number] Date of Birth: [birth year] Referring Provider: Thurston Hole   Encounter Date: 06/26/2017  PT End of Session - 06/26/17 0855    Visit Number  12    Number of Visits  12    Date for PT Re-Evaluation  07/07/17    Authorization Type  FOTO every 5th visit    PT Start Time  0817    PT Stop Time  0904    PT Time Calculation (min)  47 min    Activity Tolerance  Patient tolerated treatment well    Behavior During Therapy  [facility] for tasks assessed/performed       Past Medical History:  Diagnosis Date  . Guillain Barr syndrome (HCC)   . Hypertension     Past Surgical History:  Procedure Laterality Date  . CHOLECYSTECTOMY  1999  . TONSILLECTOMY  1969    There were no vitals filed for this visit.  Subjective Assessment - 06/26/17 0830    Subjective  Reports that his knee is "the same." Had a little discomfort with initial Nustep but dissipated with progression.     Pertinent History  COPD, HTN    Limitations  Walking;Standing    How long can you stand comfortably?  30 mins - 1hr    Diagnostic tests  MRI, x-ray    Patient Stated Goals  walk long distances, crawl, go up and down ladders, get back to where I was before    Currently in Pain?  Yes    Pain Score  1     Pain Location  Knee    Pain Orientation  Left    Pain Descriptors / Indicators  Discomfort    Pain Type  Acute pain    Pain Onset  1 to 4 weeks ago         [facility] PT Assessment - 06/26/17 0001      Assessment   Medical Diagnosis  L knee medial and lateral menisectomy and chondroplasty    Onset Date/Surgical Date  05/13/17    Next MD Visit  07/02/2017    Prior Therapy  none      Restrictions   Weight Bearing Restrictions  No                  [facility] Adult PT Treatment/Exercise - 06/26/17  0001      Knee/Hip Exercises: Aerobic   Nustep  L6 x10 min      Knee/Hip Exercises: Machines for Strengthening   Cybex Knee Extension  20# 3x10 reps    Cybex Knee Flexion  40# 3x10 reps    Cybex Leg Press  3 pl, seat 7 x30 reps      Knee/Hip Exercises: Standing   Terminal Knee Extension  Strengthening;Left;20 reps;Theraband    Theraband Level (Terminal Knee Extension)  Other (comment) Orange XTS    Lateral Step Up  Left;3 sets;10 reps;Hand Hold: 2;Step Height: 8"    Forward Step Up  Left;3 sets;10 reps;Hand Hold: 2;Step Height: 8"    Step Down  Left;2 sets;10 reps;Hand Hold: 2;Step Height: 4" Heel dot      Modalities   Modalities  Vasopneumatic      Vasopneumatic   Number Minutes Vasopneumatic   15 minutes    Vasopnuematic Location   Knee    Vasopneumatic Pressure  Medium  Vasopneumatic Temperature   34               PT Short Term Goals - 05/26/17 1153      PT SHORT TERM GOAL #1   Title  STG=LTG        PT Long Term Goals - 06/22/17 0820      PT LONG TERM GOAL #1   Title  Patient will improve L knee flexion strength to 5/5 to better perform functional activities.    Period  Weeks    Status  Achieved      PT LONG TERM GOAL #2   Title  Patient will improve L SLS to greater than or equal to 10 seconds.    Baseline  9 seconds    Time  6    Period  Weeks    Status  On-going      PT LONG TERM GOAL #3   Title  Patient will self report ability to walk community distances with less than or equal to 1/10 L knee pain in order to perform work duties.    Baseline  4/10 after grocery shopping    Time  6    Period  Weeks    Status  On-going      PT LONG TERM GOAL #4   Title  Patient will be able to tolerate kneeling and quadruped position for greater than 30 minutes with less than or equal to 2/10 L knee pain in order to perform crawling for work activities.    Baseline  4/10 with quadriped position    Time  6    Period  Weeks    Status  On-going      PT LONG  TERM GOAL #5   Title  Patient will be able to stand with equal LE weight distribution for greater than 1 hour with less than or equal to 1/10 pain to perform household activities.     Time  6    Period  Weeks    Status  On-going      PT LONG TERM GOAL #6   Title  Patient will self report no pain with ascending and descending a ladder with reciprocating gait pattern in order to perform work activities.     Baseline  has not attempted    Time  6    Period  Weeks    Status  On-going            Plan - 06/26/17 0857    Clinical Impression Statement  Patient tolerated today's treatment well although he reported only minimal discomfort with Nustep initiially and with heel dots to 4" step. Patient able to tolerate all exercises well with only minimal rest breaks secondary to fatigue and minimal VCs for technique. Normal modalities response noted following removal of the modalities.    Rehab Potential  Excellent    Clinical Impairments Affecting Rehab Potential  COPD, HTN    PT Frequency  2x / week    PT Duration  6 weeks    PT Treatment/Interventions  ADLs/Self Care Home Management;Electrical Stimulation;Iontophoresis 4mg /ml Dexamethasone;Moist Heat;Cryotherapy;Ultrasound;Therapeutic exercise;Therapeutic activities;Functional mobility training;Stair training;Gait training;Balance training;Neuromuscular re-education;Patient/family education;Manual techniques;Passive range of motion;Dry needling;Vasopneumatic Device    PT Next Visit Plan  Continue gait and balance activities; progress strengthening program; modalities PRN for pain.    PT Home Exercise Plan  standing hip ABD, SLS    Consulted and Agree with Plan of Care  Patient       Patient will  benefit from skilled therapeutic intervention in order to improve the following deficits and impairments:  Abnormal gait, Decreased balance, Difficulty walking, Decreased strength, Pain  Visit Diagnosis: Left knee pain, unspecified  chronicity  Difficulty in walking, not elsewhere classified     Problem List Patient Active Problem List   Diagnosis Date Noted  . Medial meniscus tear 05/05/2017    Roberto Lynch, PTA 06/26/2017, 9:50 AM  [facility] [address] [place], [place], [address] Phone: [phone]   Fax:  [phone]  Name: Roberto Lynch MRN: [id number] Date of Birth: 08/09/1955

## 2017-06-29 ENCOUNTER — Ambulatory Visit: Payer: 59 | Admitting: Physical Therapy

## 2017-06-29 ENCOUNTER — Encounter: Payer: Self-pay | Admitting: Physical Therapy

## 2017-06-29 DIAGNOSIS — R262 Difficulty in walking, not elsewhere classified: Secondary | ICD-10-CM

## 2017-06-29 DIAGNOSIS — M25562 Pain in left knee: Secondary | ICD-10-CM

## 2017-06-29 NOTE — Therapy (Signed)
[facility] [place], [place], [address] Phone: [phone]   Fax:  [phone]  Physical Therapy Treatment  Patient Details  Name: DILLYN MENNA MRN: [id number] Date of Birth: 1955-10-26 Referring Provider: Noemi Chapel   Encounter Date: 06/29/2017  PT End of Session - 06/29/17 0859    Visit Number  13    Number of Visits  12    Date for PT Re-Evaluation  07/07/17    Authorization Type  FOTO every 5th visit    PT Start Time  0814    PT Stop Time  0859    PT Time Calculation (min)  45 min    Activity Tolerance  Patient tolerated treatment well    Behavior During Therapy  [facility] for tasks assessed/performed       Past Medical History:  Diagnosis Date  . Guillain Barr syndrome ([place])   . Hypertension     Past Surgical History:  Procedure Laterality Date  . CHOLECYSTECTOMY  1999  . TONSILLECTOMY  1969    There were no vitals filed for this visit.  Subjective Assessment - 06/29/17 0817    Subjective  Reports that his knee is still the same. Still limited with kneeling and cannot tolerate for more than 10 minutes.    Pertinent History  COPD, HTN    Limitations  Walking;Standing    How long can you stand comfortably?  30 mins - 1hr    Diagnostic tests  MRI, x-ray    Patient Stated Goals  walk long distances, crawl, go up and down ladders, get back to where I was before    Currently in Pain?  Yes    Pain Score  1     Pain Location  Knee    Pain Orientation  Left    Pain Descriptors / Indicators  Discomfort    Pain Type  Acute pain    Pain Onset  1 to 4 weeks ago         [facility] PT Assessment - 06/29/17 0001      Assessment   Medical Diagnosis  L knee medial and lateral menisectomy and chondroplasty    Onset Date/Surgical Date  05/13/17    Next MD Visit  07/02/2017    Prior Therapy  none      Restrictions   Weight Bearing Restrictions  No                  [facility] Adult PT Treatment/Exercise - 06/29/17  0001      Knee/Hip Exercises: Aerobic   Nustep  L6 x10 min      Knee/Hip Exercises: Machines for Strengthening   Cybex Knee Extension  20# 3x10 reps eccentric control    Cybex Knee Flexion  40# 3x10 reps    Cybex Leg Press  3 pl, seat 7 x30 reps      Knee/Hip Exercises: Standing   Forward Lunges  Left;20 reps    Step Down  Left;2 sets;10 reps;Hand Hold: 2;Step Height: 4" Heel dot    Wall Squat  2 sets;10 reps    SLS  LLE SLS x4 reps; 2/4 reps were greater than 10 sec      Modalities   Modalities  Vasopneumatic      Vasopneumatic   Number Minutes Vasopneumatic   15 minutes    Vasopnuematic Location   Knee    Vasopneumatic Pressure  Medium    Vasopneumatic Temperature   34  PT Short Term Goals - 05/26/17 1153      PT SHORT TERM GOAL #1   Title  STG=LTG        PT Long Term Goals - 06/29/17 0845      PT LONG TERM GOAL #1   Title  Patient will improve L knee flexion strength to 5/5 to better perform functional activities.    Period  Weeks    Status  Achieved      PT LONG TERM GOAL #2   Title  Patient will improve L SLS to greater than or equal to 10 seconds.    Baseline  9 seconds    Time  6    Period  Weeks    Status  Partially Met 2/4 reps of L SLS greater than 10 sec 06/29/2017      PT LONG TERM GOAL #3   Title  Patient will self report ability to walk community distances with less than or equal to 1/10 L knee pain in order to perform work duties.    Baseline  4/10 after grocery shopping    Time  6    Period  Weeks    Status  On-going      PT LONG TERM GOAL #4   Title  Patient will be able to tolerate kneeling and quadruped position for greater than 30 minutes with less than or equal to 2/10 L knee pain in order to perform crawling for work activities.    Baseline  4/10 with quadriped position    Time  6    Period  Weeks    Status  On-going      PT LONG TERM GOAL #5   Title  Patient will be able to stand with equal LE weight  distribution for greater than 1 hour with less than or equal to 1/10 pain to perform household activities.     Time  6    Period  Weeks    Status  On-going      PT LONG TERM GOAL #6   Title  Patient will self report no pain with ascending and descending a ladder with reciprocating gait pattern in order to perform work activities.     Baseline  has not attempted    Time  6    Period  Weeks    Status  On-going            Plan - 06/29/17 0848    Clinical Impression Statement  Patient tolerated today's treatment well as he arrived with reports of minimal L knee discomfort. Patient tolerated today's exercises well with no complaints of any increased pain during exercises. Goals assessed today with limitations still with ambulation pain as well as SLS, kneeling, and ladders. LLE SLS assessed in clinic with 2 out of 4 reps greater than 10 sec. Normal modalities response noted following removal of the modalities.    Rehab Potential  Excellent    Clinical Impairments Affecting Rehab Potential  COPD, HTN    PT Frequency  2x / week    PT Duration  6 weeks    PT Treatment/Interventions  ADLs/Self Care Home Management;Electrical Stimulation;Iontophoresis 43m/ml Dexamethasone;Moist Heat;Cryotherapy;Ultrasound;Therapeutic exercise;Therapeutic activities;Functional mobility training;Stair training;Gait training;Balance training;Neuromuscular re-education;Patient/family education;Manual techniques;Passive range of motion;Dry needling;Vasopneumatic Device    PT Next Visit Plan  Continue gait and balance activities; progress strengthening program; modalities PRN for pain.    PT Home Exercise Plan  standing hip ABD, SLS    Consulted and Agree with Plan of  Care  Patient       Patient will benefit from skilled therapeutic intervention in order to improve the following deficits and impairments:  Abnormal gait, Decreased balance, Difficulty walking, Decreased strength, Pain  Visit Diagnosis: Left knee  pain, unspecified chronicity  Difficulty in walking, not elsewhere classified     Problem List Patient Active Problem List   Diagnosis Date Noted  . Medial meniscus tear 05/05/2017    Standley Brooking, PTA 06/29/2017, 9:01 AM  [facility] [address] [place], [place], [address] Phone: [phone]   Fax:  [phone]  Name: SUKHDEEP WIETING MRN: [id number] Date of Birth: 06-Mar-1956

## 2017-07-01 ENCOUNTER — Ambulatory Visit: Payer: 59 | Admitting: Physical Therapy

## 2017-07-01 ENCOUNTER — Encounter: Payer: Self-pay | Admitting: Physical Therapy

## 2017-07-01 DIAGNOSIS — M25562 Pain in left knee: Secondary | ICD-10-CM | POA: Diagnosis not present

## 2017-07-01 DIAGNOSIS — R262 Difficulty in walking, not elsewhere classified: Secondary | ICD-10-CM

## 2017-07-01 NOTE — Therapy (Signed)
[facility] [place], [place], [address] Phone: [phone]   Fax:  [phone]  Physical Therapy Treatment  Patient Details  Name: Roberto Lynch MRN: [id number] Date of Birth: [birth year] Referring Provider: Noemi Chapel   Encounter Date: 07/01/2017  PT End of Session - 07/01/17 0820    Visit Number  14    Number of Visits  12    Date for PT Re-Evaluation  07/07/17    Authorization Type  FOTO every 5th visit    PT Start Time  0820    PT Stop Time  0906    PT Time Calculation (min)  46 min    Activity Tolerance  Patient tolerated treatment well    Behavior During Therapy  [facility] for tasks assessed/performed       Past Medical History:  Diagnosis Date  . Guillain Barr syndrome ([place])   . Hypertension     Past Surgical History:  Procedure Laterality Date  . CHOLECYSTECTOMY  1999  . TONSILLECTOMY  1969    There were no vitals filed for this visit.  Subjective Assessment - 07/01/17 0820    Subjective  Reports pain only when he walks. Tried a ladder yesterday x1 rep and had "a little" pain.    Pertinent History  COPD, HTN    Limitations  Walking;Standing    How long can you stand comfortably?  30 mins - 1hr    Diagnostic tests  MRI, x-ray    Patient Stated Goals  walk long distances, crawl, go up and down ladders, get back to where I was before    Currently in Pain?  Yes    Pain Score  1     Pain Location  Knee    Pain Orientation  Left    Pain Descriptors / Indicators  Discomfort    Pain Type  Acute pain    Pain Onset  1 to 4 weeks ago    Pain Frequency  Intermittent    Aggravating Factors   Ambulation         [facility] PT Assessment - 07/01/17 0001      Assessment   Medical Diagnosis  L knee medial and lateral menisectomy and chondroplasty    Onset Date/Surgical Date  05/13/17    Next MD Visit  07/02/2017    Prior Therapy  none      Restrictions   Weight Bearing Restrictions  No                   [facility] Adult PT Treatment/Exercise - 07/01/17 0001      Knee/Hip Exercises: Aerobic   Elliptical  R3, L1 x7 min      Knee/Hip Exercises: Machines for Strengthening   Cybex Knee Extension  20# 3x10 reps Eccentric control    Cybex Knee Flexion  50# 3x10 reps    Cybex Leg Press  3 pl, seat 7 x30 reps      Knee/Hip Exercises: Standing   Hip Abduction  Stengthening;Both;5 reps;Knee straight red theraband; "no strain"    Step Down  Left;2 sets;10 reps;Hand Hold: 2;Step Height: 4" Heel dot    Wall Squat  2 sets;10 reps    Other Standing Knee Exercises  LLE SL squat x3 reps but reported pain      Knee/Hip Exercises: Sidelying   Hip ABduction  AROM;Both;20 reps    Clams  B SL clam x20 reps      Modalities   Modalities  Vasopneumatic  Vasopneumatic   Number Minutes Vasopneumatic   15 minutes    Vasopnuematic Location   Knee    Vasopneumatic Pressure  Medium    Vasopneumatic Temperature   63               PT Short Term Goals - 05/26/17 1153      PT SHORT TERM GOAL #1   Title  STG=LTG        PT Long Term Goals - 06/29/17 0845      PT LONG TERM GOAL #1   Title  Patient will improve L knee flexion strength to 5/5 to better perform functional activities.    Period  Weeks    Status  Achieved      PT LONG TERM GOAL #2   Title  Patient will improve L SLS to greater than or equal to 10 seconds.    Baseline  9 seconds    Time  6    Period  Weeks    Status  Partially Met 2/4 reps of L SLS greater than 10 sec 06/29/2017      PT LONG TERM GOAL #3   Title  Patient will self report ability to walk community distances with less than or equal to 1/10 L knee pain in order to perform work duties.    Baseline  4/10 after grocery shopping    Time  6    Period  Weeks    Status  On-going      PT LONG TERM GOAL #4   Title  Patient will be able to tolerate kneeling and quadruped position for greater than 30 minutes with less than or equal to 2/10 L knee  pain in order to perform crawling for work activities.    Baseline  4/10 with quadriped position    Time  6    Period  Weeks    Status  On-going      PT LONG TERM GOAL #5   Title  Patient will be able to stand with equal LE weight distribution for greater than 1 hour with less than or equal to 1/10 pain to perform household activities.     Time  6    Period  Weeks    Status  On-going      PT LONG TERM GOAL #6   Title  Patient will self report no pain with ascending and descending a ladder with reciprocating gait pattern in order to perform work activities.     Baseline  has not attempted    Time  6    Period  Weeks    Status  On-going            Plan - 07/01/17 0859    Clinical Impression Statement  Patient tolerated today's treatment well as he arrived with only complaints of pain during ambulation. Mild trendelenberg deviation noted with L SL squat and reports of pain. Patient denied any pain with elliptical warm up only fatigue. No difficulty reported by patient with sidestepping with red theraband. B SL hip abductor strengthening completed to normalize hip strength to prevent trendelenberg deviation. Goals progressing slowly as he is still having pain with ambulation and minimal pain with climbing a ladder recently. Normal vasopneumatic response noted following removal of the modality    Rehab Potential  Excellent    Clinical Impairments Affecting Rehab Potential  COPD, HTN    PT Frequency  2x / week    PT Duration  6 weeks  PT Treatment/Interventions  ADLs/Self Care Home Management;Electrical Stimulation;Iontophoresis 40m/ml Dexamethasone;Moist Heat;Cryotherapy;Ultrasound;Therapeutic exercise;Therapeutic activities;Functional mobility training;Stair training;Gait training;Balance training;Neuromuscular re-education;Patient/family education;Manual techniques;Passive range of motion;Dry needling;Vasopneumatic Device    PT Next Visit Plan  Continue gait and balance activities;  progress strengthening program; modalities PRN for pain.    PT Home Exercise Plan  standing hip ABD, SLS    Consulted and Agree with Plan of Care  Patient       Patient will benefit from skilled therapeutic intervention in order to improve the following deficits and impairments:  Abnormal gait, Decreased balance, Difficulty walking, Decreased strength, Pain  Visit Diagnosis: Left knee pain, unspecified chronicity  Difficulty in walking, not elsewhere classified     Problem List Patient Active Problem List   Diagnosis Date Noted  . Medial meniscus tear 05/05/2017    KStandley Brooking PTA 07/01/17 9:21 AM   [facility] [address] [address]Phone: [phone]  Fax:  [phone] Name: MLANNY LIPKINMRN: [id number] of Birth: [birth year]

## 2017-07-03 ENCOUNTER — Ambulatory Visit: Payer: 59 | Admitting: Physical Therapy

## 2017-07-03 DIAGNOSIS — M25562 Pain in left knee: Secondary | ICD-10-CM | POA: Diagnosis not present

## 2017-07-03 DIAGNOSIS — R262 Difficulty in walking, not elsewhere classified: Secondary | ICD-10-CM

## 2017-07-03 NOTE — Therapy (Signed)
[facility] [address] [place], [place], [address] Phone: [phone]   Fax:  [phone]  Physical Therapy Treatment  Patient Details  Name: Roberto Lynch MRN: [id number] Date of Birth: 11/26/1955 Referring Provider: Thurston HoleWainer   Encounter Date: 07/03/2017  PT End of Session - 07/03/17 0830    Visit Number  15    Number of Visits  23    Date for PT Re-Evaluation  07/31/17    Authorization Type  FOTO every 5th visit    PT Start Time  0821    PT Stop Time  0913    PT Time Calculation (min)  52 min    Activity Tolerance  Patient tolerated treatment well    Behavior During Therapy  [facility] for tasks assessed/performed       Past Medical History:  Diagnosis Date  . Guillain Barr syndrome ([facility])   . Hypertension     Past Surgical History:  Procedure Laterality Date  . CHOLECYSTECTOMY  1999  . TONSILLECTOMY  1969    There were no vitals filed for this visit.  Subjective Assessment - 07/03/17 0826    Subjective  Patient states he went to the doctor and was told to continue PT for another month.    Pertinent History  COPD, HTN    Limitations  Walking;Standing    Patient Stated Goals  walk long distances, crawl, go up and down ladders, get back to where I was before    Currently in Pain?  Yes    Pain Score  1     Pain Location  Knee    Pain Orientation  Left    Pain Descriptors / Indicators  Discomfort    Pain Type  Acute pain    Pain Onset  More than a month ago    Pain Frequency  Intermittent    Aggravating Factors   ambulation, kneeling    Pain Relieving Factors  ice and rest         [facility] PT Assessment - 07/03/17 0001      Observation/Other Assessments   Focus on Therapeutic Outcomes (FOTO)   52% limited                  [facility] Adult PT Treatment/Exercise - 07/03/17 0001      Knee/Hip Exercises: Aerobic   Elliptical  R3, L1 x7 min      Knee/Hip Exercises: Machines for Strengthening   Cybex Knee  Extension  30# 3x10 reps Eccentric control    Cybex Knee Flexion  50# 3x10 reps    Cybex Leg Press  3 pl, seat 7 x30 reps      Knee/Hip Exercises: Standing   Hip Abduction  Stengthening;Both;Knee straight;2 sets;10 reps red theraband; "no strain"    Step Down  Left;2 sets;10 reps;Hand Hold: 2;Step Height: 4" Heel dot    Wall Squat  2 sets;10 reps      Knee/Hip Exercises: Sidelying   Clams  B SL clam x20 reps with red band      Modalities   Modalities  Vasopneumatic      Vasopneumatic   Number Minutes Vasopneumatic   15 minutes    Vasopnuematic Location   Knee    Vasopneumatic Pressure  Medium    Vasopneumatic Temperature   34               PT Short Term Goals - 05/26/17 1153      PT SHORT TERM GOAL #1  Title  STG=LTG        PT Long Term Goals - 07/03/17 0919      PT LONG TERM GOAL #1   Title  Patient will improve L knee flexion strength to 5/5 to better perform functional activities.    Time  6    Period  Weeks    Status  Achieved      PT LONG TERM GOAL #2   Title  Patient will improve L SLS to greater than or equal to 10 seconds.    Baseline  9 seconds    Time  6    Period  Weeks    Status  On-going      PT LONG TERM GOAL #3   Title  Patient will self report ability to walk community distances with less than or equal to 1/10 L knee pain in order to perform work duties.    Baseline  4/10 after grocery shopping    Time  6    Period  Weeks    Status  On-going      PT LONG TERM GOAL #4   Title  Patient will be able to tolerate kneeling and quadruped position for greater than 30 minutes with less than or equal to 2/10 L knee pain in order to perform crawling for work activities.    Baseline  4/10 with quadriped position    Time  6    Period  Weeks    Status  On-going      PT LONG TERM GOAL #5   Title  Patient will be able to stand with equal LE weight distribution for greater than 1 hour with less than or equal to 1/10 pain to perform household  activities.     Time  6    Period  Weeks    Status  On-going      PT LONG TERM GOAL #6   Title  Patient will self report no pain with ascending and descending a ladder with reciprocating gait pattern in order to perform work activities.     Baseline  Patient able to do without pain    Time  6    Status  Achieved            Plan - 07/03/17 0950    Clinical Impression Statement  Patient tolerated treatment well today. Elliptical was difficulty for him requiring intermittent rests. He continues to have pain  with eccentric quad work on left. LTGs are ongoing but progressing slowly.    Rehab Potential  Excellent    Clinical Impairments Affecting Rehab Potential  COPD, HTN    PT Frequency  2x / week    PT Duration  4 weeks    PT Treatment/Interventions  ADLs/Self Care Home Management;Electrical Stimulation;Iontophoresis 4mg /ml Dexamethasone;Moist Heat;Cryotherapy;Ultrasound;Therapeutic exercise;Therapeutic activities;Functional mobility training;Stair training;Gait training;Balance training;Neuromuscular re-education;Patient/family education;Manual techniques;Passive range of motion;Dry needling;Vasopneumatic Device    PT Next Visit Plan  Continue gait and balance activities; progress strengthening program; modalities PRN for pain.    Consulted and Agree with Plan of Care  Patient       Patient will benefit from skilled therapeutic intervention in order to improve the following deficits and impairments:  Abnormal gait, Decreased balance, Difficulty walking, Decreased strength, Pain  Visit Diagnosis: Left knee pain, unspecified chronicity - Plan: PT plan of care cert/re-cert  Difficulty in walking, not elsewhere classified - Plan: PT plan of care cert/re-cert     Problem List Patient Active Problem List   Diagnosis  Date Noted  . Medial meniscus tear 05/05/2017    Solon Palm PT 07/03/2017, 9:55 AM  [facility] [address] [place], [place], [address] Phone: [phone]   Fax:  [phone]  Name: Roberto Lynch MRN: [id number] Date of Birth: [birth year]

## 2017-07-06 ENCOUNTER — Ambulatory Visit: Payer: 59 | Admitting: Physical Therapy

## 2017-07-06 DIAGNOSIS — R262 Difficulty in walking, not elsewhere classified: Secondary | ICD-10-CM

## 2017-07-06 DIAGNOSIS — M25562 Pain in left knee: Secondary | ICD-10-CM | POA: Diagnosis not present

## 2017-07-06 NOTE — Therapy (Signed)
[facility] [address] [place], [place], [address] Phone: [phone]   Fax:  [phone]  Physical Therapy Treatment  Patient Details  Name: Roberto LivingMark A Sabas MRN: [id number] Date of Birth: Aug 14, 1955 Referring Provider: Thurston HoleWainer   Encounter Date: 07/06/2017  PT End of Session - 07/06/17 0858    Visit Number  16    Number of Visits  23    Date for PT Re-Evaluation  07/31/17    Authorization Type  FOTO every 5th visit    PT Start Time  0818    PT Stop Time  0909    PT Time Calculation (min)  51 min    Activity Tolerance  Patient tolerated treatment well    Behavior During Therapy  [facility]       Past Medical History:  Diagnosis Date  . Guillain Barr syndrome ([facility])   . Hypertension     Past Surgical History:  Procedure Laterality Date  . CHOLECYSTECTOMY  1999  . TONSILLECTOMY  1969    There were no vitals filed for this visit.  Subjective Assessment - 07/06/17 0905    Subjective  Patient reported he is having breathing problems since the weekend but he is feeling a 1/10 pain in the knee. Patient stated he has been able to kneel and get down on the floor to play with his grandchild for 10 minutes with some pain.     Pertinent History  COPD, HTN    Limitations  Walking;Standing    How long can you stand comfortably?  30 mins - 1hr    Diagnostic tests  MRI, x-ray    Patient Stated Goals  walk long distances, crawl, go up and down ladders, get back to where I was before    Currently in Pain?  Yes    Pain Score  1     Pain Orientation  Left    Pain Descriptors / Indicators  Discomfort    Pain Type  Acute pain    Pain Onset  More than a month ago                      [facility] Treatment/Exercise - 07/06/17 0001      Knee/Hip Exercises: Aerobic   Elliptical  R3, L1 x 8min with intermittent rest breaks 2* to shortness of breath      Knee/Hip Exercises: Machines for Strengthening   Cybex Knee Extension  30# 3x10 reps    Cybex Knee Flexion  50# 3x10 reps    Cybex Leg Press  3 pl, seat 7 x20 reps followed by eccentric L with 1 plate O96x20      Knee/Hip Exercises: Standing   Step Down  Left;2 sets;10 reps;Hand Hold: 2;Step Height: 4"    Other Standing Knee Exercises  Lateral band walking with Red TB in tiled hallway x3      Modalities   Modalities  Vasopneumatic      Vasopneumatic   Number Minutes Vasopneumatic   15 minutes    Vasopnuematic Location   Knee    Vasopneumatic Pressure  Medium    Vasopneumatic Temperature   34               PT Short Term Goals - 05/26/17 1153      PT SHORT TERM GOAL #1   Title  STG=LTG        PT Long Term Goals - 07/03/17 [id number]  PT LONG TERM GOAL #1   Title  Patient will improve L knee flexion strength to 5/5 to better perform functional activities.    Time  6    Period  Weeks    Status  Achieved      PT LONG TERM GOAL #2   Title  Patient will improve L SLS to greater than or equal to 10 seconds.    Baseline  9 seconds    Time  6    Period  Weeks    Status  On-going      PT LONG TERM GOAL #3   Title  Patient will self report ability to walk community distances with less than or equal to 1/10 L knee pain in order to perform work duties.    Baseline  4/10 after grocery shopping    Time  6    Period  Weeks    Status  On-going      PT LONG TERM GOAL #4   Title  Patient will be able to tolerate kneeling and quadruped position for greater than 30 minutes with less than or equal to 2/10 L knee pain in order to perform crawling for work activities.    Baseline  4/10 with quadriped position    Time  6    Period  Weeks    Status  On-going      PT LONG TERM GOAL #5   Title  Patient will be able to stand with equal LE weight distribution for greater than 1 hour with less than or equal to 1/10 pain to perform household activities.     Time  6    Period  Weeks    Status  On-going      PT LONG TERM GOAL #6    Title  Patient will self report no pain with ascending and descending a ladder with reciprocating gait pattern in order to perform work activities.     Baseline  Patient able to do without pain    Time  6    Status  Achieved            Plan - 07/06/17 0844    Clinical Impression Statement  Patient was able to complete treatment with rest breaks secondary to difficulty breathing. When patient asked if he was okay to continue with therapy, he stated he will be fine with rest breaks. Patient stated the legs feel like they are progressively getting weak secondary to history of GBS. Normal response to modalities upon removal.    Clinical Presentation  Stable    Clinical Decision Making  Low    Rehab Potential  Excellent    Clinical Impairments Affecting Rehab Potential  COPD, HTN    PT Frequency  2x / week    PT Duration  4 weeks    PT Treatment/Interventions  ADLs/Self Care Home Management;Electrical Stimulation;Iontophoresis 4mg /ml Dexamethasone;Moist Heat;Cryotherapy;Ultrasound;Therapeutic exercise;Therapeutic activities;Functional mobility training;Stair training;Gait training;Balance training;Neuromuscular re-education;Patient/family education;Manual techniques;Passive range of motion;Dry needling;Vasopneumatic Device    PT Next Visit Plan  Continue gait and balance activities; progress strengthening program; modalities PRN for pain.    Consulted and Agree with Plan of Care  Patient       Patient will benefit from skilled therapeutic intervention in order to improve the following deficits and impairments:  Abnormal gait, Decreased balance, Difficulty walking, Decreased strength, Pain  Visit Diagnosis: Left knee pain, unspecified chronicity  Difficulty in walking, not elsewhere classified     Problem List Patient Active Problem List  Diagnosis Date Noted  . Medial meniscus tear 05/05/2017   Guss Bunde, PT, DPT 07/06/2017, 9:17 AM  [facility] [address] [place], [place], [address] Phone: [phone]   Fax:  [phone]  Name: BRECKON REEVES MRN: [id number] Date of Birth: [birth year]

## 2017-07-08 ENCOUNTER — Ambulatory Visit: Payer: 59 | Admitting: Physical Therapy

## 2017-07-08 ENCOUNTER — Encounter: Payer: Self-pay | Admitting: Physical Therapy

## 2017-07-08 DIAGNOSIS — R262 Difficulty in walking, not elsewhere classified: Secondary | ICD-10-CM

## 2017-07-08 DIAGNOSIS — M25562 Pain in left knee: Secondary | ICD-10-CM | POA: Diagnosis not present

## 2017-07-08 NOTE — Therapy (Signed)
[facility] [address] [place], [place], [address] Phone: [phone]   Fax:  [phone]  Physical Therapy Treatment  Patient Details  Name: Roberto LivingMark A Kistner MRN: [id number] Date of Birth: 31-Dec-1955 Referring Provider: Thurston HoleWainer   Encounter Date: 07/08/2017  PT End of Session - 07/08/17 0753    Visit Number  17    Number of Visits  23    Date for PT Re-Evaluation  07/31/17    Authorization Type  FOTO every 5th visit    PT Start Time  0733    PT Stop Time  0828    PT Time Calculation (min)  55 min    Activity Tolerance  Patient tolerated treatment well    Behavior During Therapy  [facility] for tasks assessed/performed       Past Medical History:  Diagnosis Date  . Guillain Barr syndrome (HCC)   . Hypertension     Past Surgical History:  Procedure Laterality Date  . CHOLECYSTECTOMY  1999  . TONSILLECTOMY  1969    There were no vitals filed for this visit.  Subjective Assessment - 07/08/17 0737    Subjective  Patient reported soreness yet no pain    Pertinent History  COPD, HTN    Limitations  Walking;Standing    How long can you stand comfortably?  30 mins - 1hr    Diagnostic tests  MRI, x-ray    Patient Stated Goals  walk long distances, crawl, go up and down ladders, get back to where I was before    Currently in Pain?  Yes    Pain Score  1     Pain Location  Knee    Pain Orientation  Left    Pain Descriptors / Indicators  Sore    Pain Type  Acute pain    Pain Onset  More than a month ago    Pain Frequency  Intermittent    Aggravating Factors   increased activity    Pain Relieving Factors  rest and ice                      [facility] Adult PT Treatment/Exercise - 07/08/17 0001      Knee/Hip Exercises: Aerobic   Elliptical  x165min L1 R3    Nustep  L6 x10 min      Knee/Hip Exercises: Machines for Strengthening   Cybex Knee Extension  30# 3x10 reps    Cybex Knee Flexion  50# 3x10 reps    Cybex Leg Press  3  pl, seat 7 x 30      Knee/Hip Exercises: Standing   Other Standing Knee Exercises  lateral step outs with orange XTS each way till fatigue      Vasopneumatic   Number Minutes Vasopneumatic   15 minutes    Vasopnuematic Location   Knee    Vasopneumatic Pressure  Medium               PT Short Term Goals - 05/26/17 1153      PT SHORT TERM GOAL #1   Title  STG=LTG        PT Long Term Goals - 07/03/17 0919      PT LONG TERM GOAL #1   Title  Patient will improve L knee flexion strength to 5/5 to better perform functional activities.    Time  6    Period  Weeks    Status  Achieved      PT LONG  TERM GOAL #2   Title  Patient will improve L SLS to greater than or equal to 10 seconds.    Baseline  9 seconds    Time  6    Period  Weeks    Status  On-going      PT LONG TERM GOAL #3   Title  Patient will self report ability to walk community distances with less than or equal to 1/10 L knee pain in order to perform work duties.    Baseline  4/10 after grocery shopping    Time  6    Period  Weeks    Status  On-going      PT LONG TERM GOAL #4   Title  Patient will be able to tolerate kneeling and quadruped position for greater than 30 minutes with less than or equal to 2/10 L knee pain in order to perform crawling for work activities.    Baseline  4/10 with quadriped position    Time  6    Period  Weeks    Status  On-going      PT LONG TERM GOAL #5   Title  Patient will be able to stand with equal LE weight distribution for greater than 1 hour with less than or equal to 1/10 pain to perform household activities.     Time  6    Period  Weeks    Status  On-going      PT LONG TERM GOAL #6   Title  Patient will self report no pain with ascending and descending a ladder with reciprocating gait pattern in order to perform work activities.     Baseline  Patient able to do without pain    Time  6    Status  Achieved            Plan - 07/08/17 0806    Clinical  Impression Statement  Patient tolerated treatment well today and able to progress LE strengthening. Patient required slow pace with exercises due to SOB yet able to complete all activities. Patient feels overall progress and less discomfort in knee. Patient has some ongoing soreness with increased activity. Patient progressing toward goals.     Rehab Potential  Excellent    Clinical Impairments Affecting Rehab Potential  COPD, HTN    PT Frequency  2x / week    PT Duration  4 weeks    PT Treatment/Interventions  ADLs/Self Care Home Management;Electrical Stimulation;Iontophoresis 4mg /ml Dexamethasone;Moist Heat;Cryotherapy;Ultrasound;Therapeutic exercise;Therapeutic activities;Functional mobility training;Stair training;Gait training;Balance training;Neuromuscular re-education;Patient/family education;Manual techniques;Passive range of motion;Dry needling;Vasopneumatic Device    PT Next Visit Plan  Continue gait and balance activities; progress strengthening program; modalities PRN for pain.    Consulted and Agree with Plan of Care  Patient       Patient will benefit from skilled therapeutic intervention in order to improve the following deficits and impairments:  Abnormal gait, Decreased balance, Difficulty walking, Decreased strength, Pain  Visit Diagnosis: Left knee pain, unspecified chronicity  Difficulty in walking, not elsewhere classified     Problem List Patient Active Problem List   Diagnosis Date Noted  . Medial meniscus tear 05/05/2017    Roberto Lynch P, PTA 07/08/2017, 8:33 AM  [facility] [address] [place], [place], [address] Phone: [phone]   Fax:  [phone]  Name: Roberto Lynch MRN: [id number] Date of Birth: 24-Mar-1956

## 2017-07-10 ENCOUNTER — Ambulatory Visit: Payer: 59 | Admitting: Physical Therapy

## 2017-07-10 DIAGNOSIS — R262 Difficulty in walking, not elsewhere classified: Secondary | ICD-10-CM

## 2017-07-10 DIAGNOSIS — M25562 Pain in left knee: Secondary | ICD-10-CM

## 2017-07-10 NOTE — Therapy (Signed)
[facility] [address] [place], [place], [address] Phone: [phone]   Fax:  [phone]  Physical Therapy Treatment  Patient Details  Name: Roberto Lynch MRN: [id number] Date of Birth: 1956-04-17 Referring Provider: Thurston Hole   Encounter Date: 07/10/2017  PT End of Session - 07/10/17 0903    Visit Number  18    Number of Visits  23    Date for PT Re-Evaluation  07/31/17    Authorization Type  FOTO every 5th visit    Activity Tolerance  Patient tolerated treatment well    Behavior During Therapy  [facility] for tasks assessed/performed       Past Medical History:  Diagnosis Date  . Guillain Barr syndrome (HCC)   . Hypertension     Past Surgical History:  Procedure Laterality Date  . CHOLECYSTECTOMY  1999  . TONSILLECTOMY  1969    There were no vitals filed for this visit.  Subjective Assessment - 07/10/17 0904    Subjective  Patient reported having a chest cold; knee feels "not too bad as long as I don't get on it." Patient requesed to opt out of elliptical until his breathing improves.    Pertinent History  COPD, HTN    Limitations  Walking;Standing    How long can you stand comfortably?  30 mins - 1hr    Diagnostic tests  MRI, x-ray    Patient Stated Goals  walk long distances, crawl, go up and down ladders, get back to where I was before    Currently in Pain?  Yes    Pain Score  1     Pain Location  Knee    Pain Orientation  Left    Pain Descriptors / Indicators  Sore    Pain Onset  More than a month ago         [facility] PT Assessment - 07/10/17 0001      Assessment   Medical Diagnosis  L knee medial and lateral menisectomy and chondroplasty    Onset Date/Surgical Date  05/13/17    Next MD Visit  07/20/2017    Prior Therapy  none                  [facility] Adult PT Treatment/Exercise - 07/10/17 0001      Knee/Hip Exercises: Aerobic   Stationary Bike  Level 4 x11 minutes      Knee/Hip Exercises: Machines  for Strengthening   Cybex Knee Extension  30# 3x10 reps    Cybex Knee Flexion  50# 3x10 reps    Cybex Leg Press  3 pl, seat 7 x 30      Knee/Hip Exercises: Standing   Other Standing Knee Exercises  Lateral band walking with Red TB in tiled hallway x3      Modalities   Modalities  Vasopneumatic      Vasopneumatic   Number Minutes Vasopneumatic   15 minutes    Vasopnuematic Location   Knee    Vasopneumatic Pressure  Medium               PT Short Term Goals - 05/26/17 1153      PT SHORT TERM GOAL #1   Title  STG=LTG        PT Long Term Goals - 07/03/17 0919      PT LONG TERM GOAL #1   Title  Patient will improve L knee flexion strength to 5/5 to better perform functional activities.    Time  6    Period  Weeks    Status  Achieved      PT LONG TERM GOAL #2   Title  Patient will improve L SLS to greater than or equal to 10 seconds.    Baseline  9 seconds    Time  6    Period  Weeks    Status  On-going      PT LONG TERM GOAL #3   Title  Patient will self report ability to walk community distances with less than or equal to 1/10 L knee pain in order to perform work duties.    Baseline  4/10 after grocery shopping    Time  6    Period  Weeks    Status  On-going      PT LONG TERM GOAL #4   Title  Patient will be able to tolerate kneeling and quadruped position for greater than 30 minutes with less than or equal to 2/10 L knee pain in order to perform crawling for work activities.    Baseline  4/10 with quadriped position    Time  6    Period  Weeks    Status  On-going      PT LONG TERM GOAL #5   Title  Patient will be able to stand with equal LE weight distribution for greater than 1 hour with less than or equal to 1/10 pain to perform household activities.     Time  6    Period  Weeks    Status  On-going      PT LONG TERM GOAL #6   Title  Patient will self report no pain with ascending and descending a ladder with reciprocating gait pattern in order to  perform work activities.     Baseline  Patient able to do without pain    Time  6    Status  Achieved            Plan - 07/10/17 0941    Clinical Impression Statement  Patient was able to complete treatment well despite chest cold and shortness of breath. Patient provided rest breaks to catch breath. Patient noted no increase of pain. Patient educated although progress is slow, he is still making progress. Patient reported understanding. Normal response to modalites upon removal.    Clinical Presentation  Stable    Clinical Decision Making  Low    Rehab Potential  Excellent    Clinical Impairments Affecting Rehab Potential  COPD, HTN    PT Frequency  2x / week    PT Duration  4 weeks    PT Treatment/Interventions  ADLs/Self Care Home Management;Electrical Stimulation;Iontophoresis 4mg /ml Dexamethasone;Moist Heat;Cryotherapy;Ultrasound;Therapeutic exercise;Therapeutic activities;Functional mobility training;Stair training;Gait training;Balance training;Neuromuscular re-education;Patient/family education;Manual techniques;Passive range of motion;Dry needling;Vasopneumatic Device    PT Next Visit Plan  Continue gait and balance activities; progress strengthening program; modalities PRN for pain.    Consulted and Agree with Plan of Care  Patient       Patient will benefit from skilled therapeutic intervention in order to improve the following deficits and impairments:  Abnormal gait, Decreased balance, Difficulty walking, Decreased strength, Pain  Visit Diagnosis: Left knee pain, unspecified chronicity  Difficulty in walking, not elsewhere classified     Problem List Patient Active Problem List   Diagnosis Date Noted  . Medial meniscus tear 05/05/2017   Guss BundeKrystle Ermalee Mealy, PT, DPT 07/10/2017, 9:47 AM  [facility] [address] [place], [place], [address] Phone: [phone]  Fax:  [phone]  Name: JAMEIL WHITMOYER MRN:  [id number] Date of Birth: 1955-12-27

## 2017-07-13 ENCOUNTER — Ambulatory Visit: Payer: 59 | Admitting: Physical Therapy

## 2017-07-13 DIAGNOSIS — R262 Difficulty in walking, not elsewhere classified: Secondary | ICD-10-CM

## 2017-07-13 DIAGNOSIS — M25562 Pain in left knee: Secondary | ICD-10-CM | POA: Diagnosis not present

## 2017-07-13 NOTE — Therapy (Signed)
[facility] [address] [place], [place], [address] Phone: [phone]   Fax:  [phone]  Physical Therapy Treatment  Patient Details  Name: Roberto Lynch MRN: [id number] Date of Birth: [birth year] Referring Provider: Thurston Hole   Encounter Date: 07/13/2017  PT End of Session - 07/13/17 0838    Visit Number  19    Number of Visits  23    Date for PT Re-Evaluation  07/31/17    Authorization Type  FOTO every 5th visit    PT Start Time  0818    PT Stop Time  0904    PT Time Calculation (min)  46 min    Activity Tolerance  Patient tolerated treatment well    Behavior During Therapy  [facility] for tasks assessed/performed       Past Medical History:  Diagnosis Date  . Guillain Barr syndrome (HCC)   . Hypertension     Past Surgical History:  Procedure Laterality Date  . CHOLECYSTECTOMY  1999  . TONSILLECTOMY  1969    There were no vitals filed for this visit.  Subjective Assessment - 07/13/17 0849    Subjective  Patient reported L knee is about 1-2/10 but he reports ongoing right leg swelling. He will try to get a doctors appointment to address right knee swelling.    Pertinent History  COPD, HTN    Limitations  Walking;Standing    How long can you stand comfortably?  30 mins - 1hr    Diagnostic tests  MRI, x-ray    Patient Stated Goals  walk long distances, crawl, go up and down ladders, get back to where I was before    Currently in Pain?  Yes    Pain Score  2     Pain Location  Knee    Pain Orientation  Left    Pain Descriptors / Indicators  Sore    Pain Type  Acute pain    Pain Onset  More than a month ago         [facility] PT Assessment - 07/13/17 0001      Assessment   Medical Diagnosis  L knee medial and lateral menisectomy and chondroplasty    Onset Date/Surgical Date  05/13/17    Next MD Visit  07/20/2017    Prior Therapy  none            No data recorded       [facility] Adult PT Treatment/Exercise -  07/13/17 0001      Knee/Hip Exercises: Aerobic   Stationary Bike  Level 5 x11 minutes      Knee/Hip Exercises: Machines for Strengthening   Cybex Knee Extension  30# 3x10 reps    Cybex Knee Flexion  50# 3x10 reps    Cybex Leg Press  3 pl, seat 7 x 30      Knee/Hip Exercises: Supine   Bridges  Strengthening;1 set;10 reps    Bridges with Clamshell  AROM;Both;1 set;15 reps;Limitations red Theraband    Straight Leg Raise with External Rotation  Strengthening;Left;20 reps      Modalities   Modalities  Vasopneumatic      Vasopneumatic   Number Minutes Vasopneumatic   10 minutes    Vasopnuematic Location   Knee    Vasopneumatic Pressure  Medium               PT Short Term Goals - 05/26/17 1153      PT SHORT TERM GOAL #1   Title  STG=LTG        PT Long Term Goals - 07/03/17 0919      PT LONG TERM GOAL #1   Title  Patient will improve L knee flexion strength to 5/5 to better perform functional activities.    Time  6    Period  Weeks    Status  Achieved      PT LONG TERM GOAL #2   Title  Patient will improve L SLS to greater than or equal to 10 seconds.    Baseline  9 seconds    Time  6    Period  Weeks    Status  On-going      PT LONG TERM GOAL #3   Title  Patient will self report ability to walk community distances with less than or equal to 1/10 L knee pain in order to perform work duties.    Baseline  4/10 after grocery shopping    Time  6    Period  Weeks    Status  On-going      PT LONG TERM GOAL #4   Title  Patient will be able to tolerate kneeling and quadruped position for greater than 30 minutes with less than or equal to 2/10 L knee pain in order to perform crawling for work activities.    Baseline  4/10 with quadriped position    Time  6    Period  Weeks    Status  On-going      PT LONG TERM GOAL #5   Title  Patient will be able to stand with equal LE weight distribution for greater than 1 hour with less than or equal to 1/10 pain to perform  household activities.     Time  6    Period  Weeks    Status  On-going      PT LONG TERM GOAL #6   Title  Patient will self report no pain with ascending and descending a ladder with reciprocating gait pattern in order to perform work activities.     Baseline  Patient able to do without pain    Time  6    Status  Achieved            Plan - 07/13/17 0851    Clinical Impression Statement  Patient was able to complete treatment well despite reports of right knee swelling and increased pain. Patient was able to hold L SLS for about 10 seconds however inconsistent. Normal response to modalities upon removal.    Clinical Presentation  Stable    Clinical Decision Making  Low    Rehab Potential  Excellent    Clinical Impairments Affecting Rehab Potential  COPD, HTN    PT Frequency  2x / week    PT Duration  4 weeks    PT Treatment/Interventions  ADLs/Self Care Home Management;Electrical Stimulation;Iontophoresis 4mg /ml Dexamethasone;Moist Heat;Cryotherapy;Ultrasound;Therapeutic exercise;Therapeutic activities;Functional mobility training;Stair training;Gait training;Balance training;Neuromuscular re-education;Patient/family education;Manual techniques;Passive range of motion;Dry needling;Vasopneumatic Device    PT Next Visit Plan  Continue gait and balance activities; progress strengthening program; modalities PRN for pain.    Consulted and Agree with Plan of Care  Patient       Patient will benefit from skilled therapeutic intervention in order to improve the following deficits and impairments:  Abnormal gait, Decreased balance, Difficulty walking, Decreased strength, Pain  Visit Diagnosis: Left knee pain, unspecified chronicity  Difficulty in walking, not elsewhere classified     Problem List Patient Active Problem List  Diagnosis Date Noted  . Medial meniscus tear 05/05/2017   Guss Bunde, PT, DPT 07/13/2017, 9:01 AM  [facility] [address] [place], [place], [address] Phone: [phone]   Fax:  [phone]  Name: LEWIN PELLOW MRN: [id number] Date of Birth: 1955-09-07

## 2017-07-15 ENCOUNTER — Ambulatory Visit: Payer: 59 | Admitting: Physical Therapy

## 2017-07-17 ENCOUNTER — Encounter: Payer: 59 | Admitting: *Deleted

## 2017-07-20 ENCOUNTER — Encounter: Payer: 59 | Admitting: Physical Therapy

## 2017-07-22 ENCOUNTER — Encounter: Payer: 59 | Admitting: Physical Therapy

## 2017-07-24 ENCOUNTER — Encounter: Payer: 59 | Admitting: Physical Therapy

## 2017-07-27 ENCOUNTER — Ambulatory Visit: Payer: 59 | Attending: Orthopedic Surgery | Admitting: Physical Therapy

## 2017-07-27 DIAGNOSIS — M25562 Pain in left knee: Secondary | ICD-10-CM

## 2017-07-27 DIAGNOSIS — R262 Difficulty in walking, not elsewhere classified: Secondary | ICD-10-CM | POA: Diagnosis present

## 2017-07-27 NOTE — Therapy (Signed)
[facility] [address] [place], [place], [address] Phone: [phone]   Fax:  [phone]  Physical Therapy Treatment  Patient Details  Name: Roberto Lynch MRN: [id number] Date of Birth: 1956-01-10 Referring Provider: Thurston Hole   Encounter Date: 07/27/2017  PT End of Session - 07/27/17 0905    Visit Number  20    Number of Visits  23    Date for PT Re-Evaluation  07/31/17    Authorization Type  FOTO every 5th visit    PT Start Time  0818    PT Stop Time  0905    PT Time Calculation (min)  47 min    Activity Tolerance  Patient tolerated treatment well    Behavior During Therapy  [facility] for tasks assessed/performed       Past Medical History:  Diagnosis Date  . Guillain Barr syndrome (HCC)   . Hypertension     Past Surgical History:  Procedure Laterality Date  . CHOLECYSTECTOMY  1999  . TONSILLECTOMY  1969    There were no vitals filed for this visit.  Subjective Assessment - 07/27/17 0821    Subjective  Patient returns to therapy after being on hold secondary to R LE blood clot. Patient stated it R LE still swells but he saw family doctor and stated it was okay to return to PT as long as he continues his medication. Patient states he will see his surgeon on Thursday. Pain is 0-1/10.    Pertinent History  COPD, HTN    Limitations  Walking;Standing    How long can you stand comfortably?  30 mins - 1hr    Diagnostic tests  MRI, x-ray    Patient Stated Goals  walk long distances, crawl, go up and down ladders, get back to where I was before    Currently in Pain?  Yes    Pain Score  1     Pain Location  Knee    Pain Orientation  Left    Pain Descriptors / Indicators  Sore    Pain Type  Acute pain    Pain Onset  More than a month ago    Pain Frequency  Intermittent         [facility] PT Assessment - 07/27/17 0001      Assessment   Medical Diagnosis  L knee medial and lateral menisectomy and chondroplasty    Onset  Date/Surgical Date  05/13/17    Next MD Visit  07/30/17                   [facility] Adult PT Treatment/Exercise - 07/27/17 0001      Knee/Hip Exercises: Aerobic   Stationary Bike  Level 5 x11 minutes      Knee/Hip Exercises: Standing   Forward Lunges  Left;20 reps kneel onto airex    Hip Abduction  Stengthening;Both;20 reps;Knee straight;Limitations    Abduction Limitations  Red Theraband    Forward Step Up  Left;10 reps;Hand Hold: 2;2 sets;Hand Hold: 1;Step Height: 8"      Modalities   Modalities  Vasopneumatic      Vasopneumatic   Number Minutes Vasopneumatic   15 minutes    Vasopnuematic Location   Knee    Vasopneumatic Pressure  Medium               PT Short Term Goals - 05/26/17 1153      PT SHORT TERM GOAL #1   Title  STG=LTG  PT Long Term Goals - 07/03/17 0919      PT LONG TERM GOAL #1   Title  Patient will improve L knee flexion strength to 5/5 to better perform functional activities.    Time  6    Period  Weeks    Status  Achieved      PT LONG TERM GOAL #2   Title  Patient will improve L SLS to greater than or equal to 10 seconds.    Baseline  9 seconds    Time  6    Period  Weeks    Status  On-going      PT LONG TERM GOAL #3   Title  Patient will self report ability to walk community distances with less than or equal to 1/10 L knee pain in order to perform work duties.    Baseline  4/10 after grocery shopping    Time  6    Period  Weeks    Status  On-going      PT LONG TERM GOAL #4   Title  Patient will be able to tolerate kneeling and quadruped position for greater than 30 minutes with less than or equal to 2/10 L knee pain in order to perform crawling for work activities.    Baseline  4/10 with quadriped position    Time  6    Period  Weeks    Status  On-going      PT LONG TERM GOAL #5   Title  Patient will be able to stand with equal LE weight distribution for greater than 1 hour with less than or equal to 1/10 pain to  perform household activities.     Time  6    Period  Weeks    Status  On-going      PT LONG TERM GOAL #6   Title  Patient will self report no pain with ascending and descending a ladder with reciprocating gait pattern in order to perform work activities.     Baseline  Patient able to do without pain    Time  6    Status  Achieved            Plan - 07/27/17 0913    Clinical Impression Statement  Patient was able to complete exercises despite an increase of L knee pain, 2-3/10 . Patient stated knee was more stiff due to not working on it and reported frustration with healing time. Patient was educated healing times vary with each person. Patient reported understanding. Normal response to modalities upon removal. FOTO ROM and MMT assessment next visit    Clinical Presentation  Stable    Clinical Decision Making  Low    Rehab Potential  Excellent    Clinical Impairments Affecting Rehab Potential  COPD, HTN    PT Frequency  2x / week    PT Duration  4 weeks    PT Treatment/Interventions  ADLs/Self Care Home Management;Electrical Stimulation;Iontophoresis 4mg /ml Dexamethasone;Moist Heat;Cryotherapy;Ultrasound;Therapeutic exercise;Therapeutic activities;Functional mobility training;Stair training;Gait training;Balance training;Neuromuscular re-education;Patient/family education;Manual techniques;Passive range of motion;Dry needling;Vasopneumatic Device    PT Next Visit Plan  FOTO, ROM and MMT assessment; continue ther-ex to address goals.    Consulted and Agree with Plan of Care  Patient       Patient will benefit from skilled therapeutic intervention in order to improve the following deficits and impairments:  Abnormal gait, Decreased balance, Difficulty walking, Decreased strength, Pain  Visit Diagnosis: Left knee pain, unspecified chronicity  Difficulty in walking,  not elsewhere classified     Problem List Patient Active Problem List   Diagnosis Date Noted  . Medial meniscus  tear 05/05/2017   Guss Bunde, PT, DPT 07/27/2017, 9:51 AM  [facility] [address] [place], [place], [address] Phone: [phone]   Fax:  [phone]  Name: Roberto Lynch MRN: [id number] Date of Birth: November 20, 1955

## 2017-07-29 ENCOUNTER — Ambulatory Visit: Payer: 59 | Admitting: Physical Therapy

## 2017-07-29 ENCOUNTER — Encounter: Payer: Self-pay | Admitting: Physical Therapy

## 2017-07-29 DIAGNOSIS — R262 Difficulty in walking, not elsewhere classified: Secondary | ICD-10-CM

## 2017-07-29 DIAGNOSIS — M25562 Pain in left knee: Secondary | ICD-10-CM | POA: Diagnosis not present

## 2017-07-29 NOTE — Therapy (Signed)
[facility] [place], [place], [address] Phone: [phone]   Fax:  [phone]  Physical Therapy Treatment  Patient Details  Name: BLAIDEN WERTH MRN: [id number] Date of Birth: 05-30-1955 Referring Provider: Noemi Chapel   Encounter Date: 07/29/2017  PT End of Session - 07/29/17 0826    Visit Number  21    Number of Visits  23    Date for PT Re-Evaluation  07/31/17    Authorization Type  FOTO every 5th visit    PT Start Time  0818    PT Stop Time  0906    PT Time Calculation (min)  48 min    Activity Tolerance  Patient tolerated treatment well    Behavior During Therapy  [facility] for tasks assessed/performed       Past Medical History:  Diagnosis Date  . Guillain Barr syndrome ([place])   . Hypertension     Past Surgical History:  Procedure Laterality Date  . CHOLECYSTECTOMY  1999  . TONSILLECTOMY  1969    There were no vitals filed for this visit.  Subjective Assessment - 07/29/17 0820    Subjective  Patient reports that his LLE still has a hard time in getting up the ladder. Reports that RLE is still swollen but no pain and Dr. Edrick Oh approved him to return to PT.    Pertinent History  COPD, HTN    Limitations  Walking;Standing    How long can you stand comfortably?  30 mins - 1hr    Diagnostic tests  MRI, x-ray    Patient Stated Goals  walk long distances, crawl, go up and down ladders, get back to where I was before    Currently in Pain?  Other (Comment) No pain assessment provided by patient         [facility] PT Assessment - 07/29/17 0001      Assessment   Medical Diagnosis  L knee medial and lateral menisectomy and chondroplasty    Onset Date/Surgical Date  05/13/17    Next MD Visit  07/30/17    Prior Therapy  none      Restrictions   Weight Bearing Restrictions  No                   [facility] Adult PT Treatment/Exercise - 07/29/17 0001      Ambulation/Gait   Stairs  Yes    Stairs Assistance  6:  Modified independent (Device/Increase time)    Stair Management Technique  No rails;Alternating pattern;Forwards    Number of Stairs  4 x1 RT    Height of Stairs  6.5      Knee/Hip Exercises: Aerobic   Stationary Bike  Level 5 x15 minutes      Knee/Hip Exercises: Standing   Terminal Knee Extension  Strengthening;Left;2 sets;10 reps;Theraband    Theraband Level (Terminal Knee Extension)  Level 2 (Red)    Hip Abduction  Stengthening;Both;20 reps;Knee straight;Limitations    Abduction Limitations  Red Theraband    Lateral Step Up  Left;2 sets;10 reps;Hand Hold: 2;Step Height: 8"    Forward Step Up  Left;2 sets;10 reps;Hand Hold: 2;Step Height: 8"    Step Down  Left;2 sets;10 reps;Hand Hold: 2;Step Height: 4" Reported greater pain/discomfort; heel tap      Modalities   Modalities  Vasopneumatic      Vasopneumatic   Number Minutes Vasopneumatic   15 minutes    Vasopnuematic Location   Knee    Vasopneumatic Pressure  Medium    Vasopneumatic Temperature   36               PT Short Term Goals - 05/26/17 1153      PT SHORT TERM GOAL #1   Title  STG=LTG        PT Long Term Goals - 07/29/17 0857      PT LONG TERM GOAL #1   Title  Patient will improve L knee flexion strength to 5/5 to better perform functional activities.    Time  6    Period  Weeks    Status  Achieved      PT LONG TERM GOAL #2   Title  Patient will improve L SLS to greater than or equal to 10 seconds.    Baseline  9 seconds    Time  6    Period  Weeks    Status  Achieved      PT LONG TERM GOAL #3   Title  Patient will self report ability to walk community distances with less than or equal to 1/10 L knee pain in order to perform work duties.    Baseline  4/10 after grocery shopping    Time  6    Period  Weeks    Status  Not Met      PT LONG TERM GOAL #4   Title  Patient will be able to tolerate kneeling and quadruped position for greater than 30 minutes with less than or equal to 2/10 L knee  pain in order to perform crawling for work activities.    Baseline  4/10 with quadriped position    Time  6    Period  Weeks    Status  Not Met      PT LONG TERM GOAL #5   Title  Patient will be able to stand with equal LE weight distribution for greater than 1 hour with less than or equal to 1/10 pain to perform household activities.     Time  6    Period  Weeks    Status  Not Met      PT LONG TERM GOAL #6   Title  Patient will self report no pain with ascending and descending a ladder with reciprocating gait pattern in order to perform work activities.     Baseline  Patient able to do without pain    Time  6    Status  Achieved            Plan - 07/29/17 0912    Clinical Impression Statement  Patient continues to present with RLE swelling but no RLE pain. Patient able to tolerate step up activities with greatest pain in being with LLE heel dot to 4" step. No other pain reports provided by patient. Patient able to tolerate LLE SLS on floor for greater than 10 sec. Patient unable to achieve LTG for prolonged standing, ambulation and quadruped positioning at this time. Normal vasopneumatic response noted following removal of the modality. Patient advised to avoid anything to RLE such as ice or heat prior to orthopedist appointment today to get better answers.    Rehab Potential  Excellent    Clinical Impairments Affecting Rehab Potential  COPD, HTN    PT Frequency  2x / week    PT Duration  4 weeks    PT Treatment/Interventions  ADLs/Self Care Home Management;Electrical Stimulation;Iontophoresis 18m/ml Dexamethasone;Moist Heat;Cryotherapy;Ultrasound;Therapeutic exercise;Therapeutic activities;Functional mobility training;Stair training;Gait training;Balance training;Neuromuscular re-education;Patient/family education;Manual techniques;Passive range of  motion;Dry needling;Vasopneumatic Device    PT Next Visit Plan  Continue per MD discretion.    PT Home Exercise Plan  standing hip ABD,  SLS    Consulted and Agree with Plan of Care  Patient       Patient will benefit from skilled therapeutic intervention in order to improve the following deficits and impairments:  Abnormal gait, Decreased balance, Difficulty walking, Decreased strength, Pain  Visit Diagnosis: Left knee pain, unspecified chronicity  Difficulty in walking, not elsewhere classified     Problem List Patient Active Problem List   Diagnosis Date Noted  . Medial meniscus tear 05/05/2017   Standley Brooking, PTA 07/29/17 9:20 AM   [facility] [address] [place], [place], [address] Phone: [phone]   Fax:  [phone]  Name: SIAH STEELY MRN: [id number] Date of Birth: 01/08/1956

## 2017-07-31 ENCOUNTER — Encounter: Payer: 59 | Admitting: Physical Therapy

## 2017-08-03 ENCOUNTER — Ambulatory Visit: Payer: 59 | Admitting: Physical Therapy

## 2017-08-03 DIAGNOSIS — M25562 Pain in left knee: Secondary | ICD-10-CM

## 2017-08-03 DIAGNOSIS — R262 Difficulty in walking, not elsewhere classified: Secondary | ICD-10-CM

## 2017-08-03 NOTE — Therapy (Signed)
[facility] [place], [place], [address] Phone: [phone]   Fax:  [phone]  Physical Therapy Treatment  Patient Details  Name: Roberto Lynch MRN: [id number] Date of Birth: [birth year] Referring Provider: Noemi Chapel   Encounter Date: 08/03/2017  PT End of Session - 08/03/17 0908    Visit Number  22    Number of Visits  23    Date for PT Re-Evaluation  07/31/17    PT Start Time  0901    PT Stop Time  0956    PT Time Calculation (min)  55 min    Activity Tolerance  Patient tolerated treatment well    Behavior During Therapy  [facility] for tasks assessed/performed       Past Medical History:  Diagnosis Date  . Guillain Barr syndrome ([place])   . Hypertension     Past Surgical History:  Procedure Laterality Date  . CHOLECYSTECTOMY  1999  . TONSILLECTOMY  1969    There were no vitals filed for this visit.  Subjective Assessment - 08/03/17 0906    Subjective  Patient reported knee is feeling "okay. Pain is bearable." Patient stated he sees the surgeon Tuesday.     Pertinent History  COPD, HTN    Limitations  Walking;Standing    How long can you stand comfortably?  30 mins - 1hr    Diagnostic tests  MRI, x-ray    Patient Stated Goals  walk long distances, crawl, go up and down ladders, get back to where I was before    Currently in Pain?  Yes    Pain Score  2     Pain Orientation  Left    Pain Descriptors / Indicators  Sore    Pain Type  Acute pain    Pain Onset  More than a month ago    Pain Frequency  Constant         [facility] PT Assessment - 08/03/17 0001      Assessment   Medical Diagnosis  L knee medial and lateral menisectomy and chondroplasty    Onset Date/Surgical Date  05/13/17    Next MD Visit  08/04/17    Prior Therapy  none      Restrictions   Weight Bearing Restrictions  No      Observation/Other Assessments   Focus on Therapeutic Outcomes (FOTO)   57% limited      AROM   Left Knee Extension  -2     Left Knee Flexion  130      Strength   Overall Strength Comments  4+/5 hip abduction strength    Left Knee Flexion  5/5    Left Knee Extension  5/5                   [facility] Adult PT Treatment/Exercise - 08/03/17 0001      Knee/Hip Exercises: Aerobic   Stationary Bike  Level 5 x10 minutes; LE only      Knee/Hip Exercises: Machines for Strengthening   Cybex Knee Extension  Left leg only #20 2x10    Cybex Knee Flexion  Left leg only #30 2x10      Knee/Hip Exercises: Standing   Forward Step Up  Left;2 sets;10 reps;Hand Hold: 2;Step Height: 8";Step Height: 2" 10 " step    Rocker Board  2 minutes      Modalities   Modalities  Vasopneumatic      Vasopneumatic   Number Minutes Vasopneumatic   15  minutes    Vasopnuematic Location   Knee    Vasopneumatic Pressure  Low               PT Short Term Goals - 05/26/17 1153      PT SHORT TERM GOAL #1   Title  STG=LTG        PT Long Term Goals - 08/03/17 0925      PT LONG TERM GOAL #1   Title  Patient will improve L knee flexion strength to 5/5 to better perform functional activities.    Time  6    Period  Weeks    Status  Achieved      PT LONG TERM GOAL #2   Title  Patient will improve L SLS to greater than or equal to 10 seconds.    Baseline  9 seconds    Time  6    Period  Weeks    Status  Achieved      PT LONG TERM GOAL #3   Title  Patient will self report ability to walk community distances with less than or equal to 1/10 L knee pain in order to perform work duties.    Time  6    Period  Weeks    Status  Partially Met 1-2/10      PT LONG TERM GOAL #4   Title  Patient will be able to tolerate kneeling and quadruped position for greater than 30 minutes with less than or equal to 2/10 L knee pain in order to perform crawling for work activities.    Time  6    Status  Not Met 10-15 mins 2/10 pain      PT LONG TERM GOAL #5   Title  Patient will be able to stand with equal LE weight distribution  for greater than 1 hour with less than or equal to 1/10 pain to perform household activities.     Time  6    Period  Weeks    Status  Not Met      PT LONG TERM GOAL #6   Title  Patient will self report no pain with ascending and descending a ladder with reciprocating gait pattern in order to perform work activities.     Baseline  Patient able to do without pain    Time  6    Period  Weeks    Status  Achieved            Plan - 08/03/17 1653    Clinical Impression Statement  Patient was able to tolerate treatment well however patient noted wth increased R LE swelling. Patient's LE was assessed and exercises resumed with limited RLE involvement. Patient noted increased difficulty with 10 inch step up and stated it simulated ladders he has to climb at work. Patient stated pain is a constant 1-2/10 and reports frustration with healing process. Patient noted with improved L knee AROM. Patient to see surgeon tomorrow.    Clinical Presentation  Stable    Clinical Decision Making  Low    Rehab Potential  Excellent    Clinical Impairments Affecting Rehab Potential  COPD, HTN    PT Frequency  2x / week    PT Duration  4 weeks    PT Treatment/Interventions  ADLs/Self Care Home Management;Electrical Stimulation;Iontophoresis 77m/ml Dexamethasone;Moist Heat;Cryotherapy;Ultrasound;Therapeutic exercise;Therapeutic activities;Functional mobility training;Stair training;Gait training;Balance training;Neuromuscular re-education;Patient/family education;Manual techniques;Passive range of motion;Dry needling;Vasopneumatic Device    PT Next Visit Plan  Continue per MD discretion.  Consulted and Agree with Plan of Care  Patient       Patient will benefit from skilled therapeutic intervention in order to improve the following deficits and impairments:  Abnormal gait, Decreased balance, Difficulty walking, Decreased strength, Pain  Visit Diagnosis: Left knee pain, unspecified chronicity  Difficulty in  walking, not elsewhere classified     Problem List Patient Active Problem List   Diagnosis Date Noted  . Medial meniscus tear 05/05/2017   Gabriela Eves, PT, DPT 08/03/2017, 5:00 PM  [facility] [place], [place], [address] Phone: [phone]   Fax:  [phone]  Name: FRANK NOVELO MRN: [id number] Date of Birth: Jan 23, 1956

## 2017-08-05 ENCOUNTER — Ambulatory Visit: Payer: 59 | Admitting: Physical Therapy

## 2017-08-05 DIAGNOSIS — M25562 Pain in left knee: Secondary | ICD-10-CM

## 2017-08-05 DIAGNOSIS — R262 Difficulty in walking, not elsewhere classified: Secondary | ICD-10-CM

## 2017-08-05 NOTE — Therapy (Signed)
[facility] [place], [place], [address] Phone: [phone]   Fax:  [phone]  Physical Therapy Treatment  Patient Details  Name: Roberto Lynch MRN: [id number] Date of Birth: 07-31-1955 Referring Provider: Noemi Chapel   Encounter Date: 08/05/2017  PT End of Session - 08/05/17 1246    Visit Number  23    Number of Visits  23    Date for PT Re-Evaluation  07/31/17    Authorization Type  FOTO every 5th visit    PT Start Time  0820    PT Stop Time  0910    PT Time Calculation (min)  50 min    Activity Tolerance  Patient tolerated treatment well    Behavior During Therapy  [facility] for tasks assessed/performed       Past Medical History:  Diagnosis Date  . Guillain Barr syndrome ([place])   . Hypertension     Past Surgical History:  Procedure Laterality Date  . CHOLECYSTECTOMY  1999  . TONSILLECTOMY  1969    There were no vitals filed for this visit.  Subjective Assessment - 08/05/17 0832    Subjective  Patient reported his doctors appointment went well. Patient to continue therapy and referral will be faxed. He stated will be out of work for another month. Patient also stated the doctor referred him to a vein specialist for his right leg.     Pertinent History  COPD, HTN    Limitations  Walking;Standing    How long can you stand comfortably?  30 mins - 1hr    Diagnostic tests  MRI, x-ray    Patient Stated Goals  walk long distances, crawl, go up and down ladders, get back to where I was before    Currently in Pain?  Yes    Pain Score  2     Pain Location  Knee    Pain Orientation  Left    Pain Descriptors / Indicators  Sore    Pain Type  Chronic pain    Pain Onset  More than a month ago    Pain Frequency  Constant         [facility] PT Assessment - 08/05/17 0001      Assessment   Medical Diagnosis  L knee medial and lateral menisectomy and chondroplasty    Onset Date/Surgical Date  05/13/17    Next MD Visit  09/04/17     Prior Therapy  none      Restrictions   Weight Bearing Restrictions  No                   [facility] Adult PT Treatment/Exercise - 08/05/17 0001      Knee/Hip Exercises: Aerobic   Stationary Bike  Level 5 x15 minutes; LE only      Knee/Hip Exercises: Machines for Strengthening   Cybex Leg Press  3 pl, seat 7  2x10 followed by single leg eccentric control 1 plate x20      Knee/Hip Exercises: Standing   Step Down  Left;3 sets;10 reps;Hand Hold: 2;Step Height: 4"    Other Standing Knee Exercises  Lateral band walking with Red TB in tiled hallway x1      Modalities   Modalities  Vasopneumatic      Vasopneumatic   Number Minutes Vasopneumatic   15 minutes    Vasopnuematic Location   Knee    Vasopneumatic Pressure  Low  PT Short Term Goals - 05/26/17 1153      PT SHORT TERM GOAL #1   Title  STG=LTG        PT Long Term Goals - 08/03/17 0925      PT LONG TERM GOAL #1   Title  Patient will improve L knee flexion strength to 5/5 to better perform functional activities.    Time  6    Period  Weeks    Status  Achieved      PT LONG TERM GOAL #2   Title  Patient will improve L SLS to greater than or equal to 10 seconds.    Baseline  9 seconds    Time  6    Period  Weeks    Status  Achieved      PT LONG TERM GOAL #3   Title  Patient will self report ability to walk community distances with less than or equal to 1/10 L knee pain in order to perform work duties.    Time  6    Period  Weeks    Status  Partially Met 1-2/10      PT LONG TERM GOAL #4   Title  Patient will be able to tolerate kneeling and quadruped position for greater than 30 minutes with less than or equal to 2/10 L knee pain in order to perform crawling for work activities.    Time  6    Status  Not Met 10-15 mins 2/10 pain      PT LONG TERM GOAL #5   Title  Patient will be able to stand with equal LE weight distribution for greater than 1 hour with less than or equal to 1/10  pain to perform household activities.     Time  6    Period  Weeks    Status  Not Met      PT LONG TERM GOAL #6   Title  Patient will self report no pain with ascending and descending a ladder with reciprocating gait pattern in order to perform work activities.     Baseline  Patient able to do without pain    Time  6    Period  Weeks    Status  Achieved            Plan - 08/05/17 1248    Clinical Impression Statement  Patient was able to tolerate treatment well with exception of eccentric control exercises. Patient noted with increased muscle fatigue during R quad eccentric control at about 30 degrees of flexion. Patient despite muscle fatigue, he felt it "loosened up the joint." Normal response to modalities upon removal.    Clinical Presentation  Stable    Clinical Decision Making  Low    Rehab Potential  Excellent    Clinical Impairments Affecting Rehab Potential  COPD, HTN    PT Frequency  2x / week    PT Duration  4 weeks    PT Treatment/Interventions  ADLs/Self Care Home Management;Electrical Stimulation;Iontophoresis 28m/ml Dexamethasone;Moist Heat;Cryotherapy;Ultrasound;Therapeutic exercise;Therapeutic activities;Functional mobility training;Stair training;Gait training;Balance training;Neuromuscular re-education;Patient/family education;Manual techniques;Passive range of motion;Dry needling;Vasopneumatic Device    PT Next Visit Plan  Waiting for new referral. (08/05/17) Continue eccentric Quad strengthening. modalities prn.    Consulted and Agree with Plan of Care  Patient       Patient will benefit from skilled therapeutic intervention in order to improve the following deficits and impairments:  Abnormal gait, Decreased balance, Difficulty walking, Decreased strength, Pain  Visit Diagnosis:  Left knee pain, unspecified chronicity  Difficulty in walking, not elsewhere classified     Problem List Patient Active Problem List   Diagnosis Date Noted  . Medial meniscus  tear 05/05/2017   Gabriela Eves, PT, DPT 08/05/2017, 12:59 PM  [facility] [address] [place], [place], [address] Phone: [phone]   Fax:  [phone]  Name: MARTIN Lynch MRN: [id number] Date of Birth: 02/01/1956

## 2017-08-10 ENCOUNTER — Ambulatory Visit: Payer: 59 | Admitting: Physical Therapy

## 2017-08-10 ENCOUNTER — Encounter: Payer: Self-pay | Admitting: Physical Therapy

## 2017-08-10 DIAGNOSIS — R262 Difficulty in walking, not elsewhere classified: Secondary | ICD-10-CM

## 2017-08-10 DIAGNOSIS — M25562 Pain in left knee: Secondary | ICD-10-CM | POA: Diagnosis not present

## 2017-08-10 NOTE — Therapy (Signed)
[facility] [place], [place], [address] Phone: [phone]   Fax:  [phone]  Physical Therapy Treatment  Patient Details  Name: Roberto Lynch MRN: [id number] Date of Birth: [birth year] Referring Provider: Noemi Chapel   Encounter Date: 08/10/2017  PT End of Session - 08/10/17 0853    Visit Number  24    Date for PT Re-Evaluation  07/31/17    Authorization Type  FOTO every 5th visit    PT Start Time  0821    PT Stop Time  0911    PT Time Calculation (min)  50 min    Activity Tolerance  Patient tolerated treatment well    Behavior During Therapy  [facility] for tasks assessed/performed       Past Medical History:  Diagnosis Date  . Guillain Barr syndrome ([place])   . Hypertension     Past Surgical History:  Procedure Laterality Date  . CHOLECYSTECTOMY  1999  . TONSILLECTOMY  1969    There were no vitals filed for this visit.  Subjective Assessment - 08/10/17 0827    Subjective  Patient arrived fatigue due to death in family, some ongoing discomfort in left knee    Pertinent History  COPD, HTN    Limitations  Walking;Standing    How long can you stand comfortably?  30 mins - 1hr    Diagnostic tests  MRI, x-ray    Patient Stated Goals  walk long distances, crawl, go up and down ladders, get back to where I was before    Currently in Pain?  Yes    Pain Score  2     Pain Location  Knee    Pain Orientation  Left    Pain Descriptors / Indicators  Sore    Pain Type  Chronic pain    Pain Onset  More than a month ago    Pain Frequency  Intermittent    Aggravating Factors   increased activity    Pain Relieving Factors  rest and ice                       [facility] Adult PT Treatment/Exercise - 08/10/17 0001      Knee/Hip Exercises: Aerobic   Stationary Bike  Level 5 x20 minutes; LE only      Knee/Hip Exercises: Machines for Strengthening   Cybex Knee Extension  10# Left LE 2x10    Cybex Knee Flexion  40# Left  LE 2x10    Cybex Leg Press  1plt left LE ecentric 4x10      Knee/Hip Exercises: Standing   Step Down  Left;3 sets;10 reps;Hand Hold: 2;Step Height: 4"      Vasopneumatic   Number Minutes Vasopneumatic   15 minutes    Vasopnuematic Location   Knee    Vasopneumatic Pressure  Low               PT Short Term Goals - 05/26/17 1153      PT SHORT TERM GOAL #1   Title  STG=LTG        PT Long Term Goals - 08/03/17 0925      PT LONG TERM GOAL #1   Title  Patient will improve L knee flexion strength to 5/5 to better perform functional activities.    Time  6    Period  Weeks    Status  Achieved      PT LONG TERM GOAL #2   Title  Patient will improve L SLS to greater than or equal to 10 seconds.    Baseline  9 seconds    Time  6    Period  Weeks    Status  Achieved      PT LONG TERM GOAL #3   Title  Patient will self report ability to walk community distances with less than or equal to 1/10 L knee pain in order to perform work duties.    Time  6    Period  Weeks    Status  Partially Met 1-2/10      PT LONG TERM GOAL #4   Title  Patient will be able to tolerate kneeling and quadruped position for greater than 30 minutes with less than or equal to 2/10 L knee pain in order to perform crawling for work activities.    Time  6    Status  Not Met 10-15 mins 2/10 pain      PT LONG TERM GOAL #5   Title  Patient will be able to stand with equal LE weight distribution for greater than 1 hour with less than or equal to 1/10 pain to perform household activities.     Time  6    Period  Weeks    Status  Not Met      PT LONG TERM GOAL #6   Title  Patient will self report no pain with ascending and descending a ladder with reciprocating gait pattern in order to perform work activities.     Baseline  Patient able to do without pain    Time  6    Period  Weeks    Status  Achieved            Plan - 08/10/17 0856    Clinical Impression Statement  Patient tolerated treatment  well today with some ongoing discomfort. Patient still unable to climb a ladder or crawl on knees which is required for his job. Patient has not returned to work at this time and will have a F/U with MD in 3 weeks to discuss POC. Patient able to comple exercises yet ongoing difficulty with left LE and noted weakness with eccentric control. Remaining goals ongpoing.     Rehab Potential  Excellent    Clinical Impairments Affecting Rehab Potential  COPD, HTN    PT Frequency  2x / week    PT Duration  4 weeks    PT Treatment/Interventions  ADLs/Self Care Home Management;Electrical Stimulation;Iontophoresis 73m/ml Dexamethasone;Moist Heat;Cryotherapy;Ultrasound;Therapeutic exercise;Therapeutic activities;Functional mobility training;Stair training;Gait training;Balance training;Neuromuscular re-education;Patient/family education;Manual techniques;Passive range of motion;Dry needling;Vasopneumatic Device    PT Next Visit Plan  Waiting for new referral. (08/05/17) Continue eccentric Quad strengthening. modalities prn.    Consulted and Agree with Plan of Care  Patient       Patient will benefit from skilled therapeutic intervention in order to improve the following deficits and impairments:  Abnormal gait, Decreased balance, Difficulty walking, Decreased strength, Pain  Visit Diagnosis: Left knee pain, unspecified chronicity  Difficulty in walking, not elsewhere classified     Problem List Patient Active Problem List   Diagnosis Date Noted  . Medial meniscus tear 05/05/2017    CLadean Raya PTA 08/10/17 9:17 AM  [facility] [address] [address]Phone: [phone]  Fax:  [phone] Name: MAARSH FRISTOEMRN: [id number] of Birth: 804-19-1957

## 2017-08-17 ENCOUNTER — Ambulatory Visit: Payer: 59 | Admitting: Physical Therapy

## 2017-08-17 ENCOUNTER — Encounter: Payer: Self-pay | Admitting: Physical Therapy

## 2017-08-17 DIAGNOSIS — M25562 Pain in left knee: Secondary | ICD-10-CM

## 2017-08-17 DIAGNOSIS — R262 Difficulty in walking, not elsewhere classified: Secondary | ICD-10-CM

## 2017-08-17 NOTE — Therapy (Signed)
[facility] [place], [place], [address] Phone: [phone]   Fax:  [phone]  Physical Therapy Treatment  Patient Details  Name: Roberto Lynch MRN: [id number] Date of Birth: 1956-02-25 Referring Provider: Noemi Chapel   Encounter Date: 08/17/2017  PT End of Session - 08/17/17 0839    Visit Number  25    Number of Visits  32    Date for PT Re-Evaluation  08/28/17    Authorization Type  FOTO every 5th visit    PT Start Time  0817    PT Stop Time  0902    PT Time Calculation (min)  45 min    Activity Tolerance  Patient tolerated treatment well    Behavior During Therapy  [facility] for tasks assessed/performed       Past Medical History:  Diagnosis Date  . Guillain Barr syndrome ([employer])   . Hypertension     Past Surgical History:  Procedure Laterality Date  . CHOLECYSTECTOMY  1999  . TONSILLECTOMY  1969    There were no vitals filed for this visit.  Subjective Assessment - 08/17/17 0818    Subjective  Patient arrived with some ongoing reported discomfort    Pertinent History  COPD, HTN    Limitations  Walking;Standing    How long can you stand comfortably?  30 mins - 1hr    Diagnostic tests  MRI, x-ray    Patient Stated Goals  walk long distances, crawl, go up and down ladders, get back to where I was before    Currently in Pain?  Yes    Pain Score  2     Pain Location  Knee    Pain Orientation  Left    Pain Descriptors / Indicators  Sore    Pain Type  Chronic pain    Pain Onset  More than a month ago    Pain Frequency  Intermittent    Aggravating Factors   increased activity    Pain Relieving Factors  rest                       [facility] Adult PT Treatment/Exercise - 08/17/17 0001      Knee/Hip Exercises: Aerobic   Stationary Bike  Level 5 x15 minutes; LE only      Knee/Hip Exercises: Machines for Strengthening   Cybex Knee Extension  10# Left LE 2x10    Cybex Knee Flexion  40# Left LE 2x10    Cybex Leg Press  1plt left LE ecentric 4x10      Knee/Hip Exercises: Standing   Lateral Step Up  2 sets;10 reps;Step Height: 4"    Rocker Board  2 minutes      Vasopneumatic   Number Minutes Vasopneumatic   15 minutes    Vasopnuematic Location   Knee    Vasopneumatic Pressure  Low               PT Short Term Goals - 05/26/17 1153      PT SHORT TERM GOAL #1   Title  STG=LTG        PT Long Term Goals - 08/03/17 0925      PT LONG TERM GOAL #1   Title  Patient will improve L knee flexion strength to 5/5 to better perform functional activities.    Time  6    Period  Weeks    Status  Achieved      PT LONG TERM GOAL #  2   Title  Patient will improve L SLS to greater than or equal to 10 seconds.    Baseline  9 seconds    Time  6    Period  Weeks    Status  Achieved      PT LONG TERM GOAL #3   Title  Patient will self report ability to walk community distances with less than or equal to 1/10 L knee pain in order to perform work duties.    Time  6    Period  Weeks    Status  Partially Met 1-2/10      PT LONG TERM GOAL #4   Title  Patient will be able to tolerate kneeling and quadruped position for greater than 30 minutes with less than or equal to 2/10 L knee pain in order to perform crawling for work activities.    Time  6    Status  Not Met 10-15 mins 2/10 pain      PT LONG TERM GOAL #5   Title  Patient will be able to stand with equal LE weight distribution for greater than 1 hour with less than or equal to 1/10 pain to perform household activities.     Time  6    Period  Weeks    Status  Not Met      PT LONG TERM GOAL #6   Title  Patient will self report no pain with ascending and descending a ladder with reciprocating gait pattern in order to perform work activities.     Baseline  Patient able to do without pain    Time  6    Period  Weeks    Status  Achieved            Plan - 08/17/17 0844    Clinical Impression Statement  Patient tolerated  treatment well today. Patient feels some less discomfort overall and with light activity. Patient able to progress through exercises well today. Patient unable to climb a ldder or crawl on knees at this time which are required for work duties. Goals ongoing at this time.     Rehab Potential  Excellent    Clinical Impairments Affecting Rehab Potential  COPD, HTN    PT Frequency  2x / week    PT Duration  4 weeks    PT Treatment/Interventions  ADLs/Self Care Home Management;Electrical Stimulation;Iontophoresis 4m/ml Dexamethasone;Moist Heat;Cryotherapy;Ultrasound;Therapeutic exercise;Therapeutic activities;Functional mobility training;Stair training;Gait training;Balance training;Neuromuscular re-education;Patient/family education;Manual techniques;Passive range of motion;Dry needling;Vasopneumatic Device    PT Next Visit Plan   Continue eccentric Quad strengthening and work simulated exercises as able. modalities prn.    Consulted and Agree with Plan of Care  Patient       Patient will benefit from skilled therapeutic intervention in order to improve the following deficits and impairments:  Abnormal gait, Decreased balance, Difficulty walking, Decreased strength, Pain  Visit Diagnosis: Left knee pain, unspecified chronicity  Difficulty in walking, not elsewhere classified     Problem List Patient Active Problem List   Diagnosis Date Noted  . Medial meniscus tear 05/05/2017    Francisca Langenderfer P, PTA 08/17/2017, 9:05 AM  [facility] [address]Phone: [phone]  Fax:  [phone] Name: MMENELIK MCFARRENMRN: [id number] of Birth: [birth year]

## 2017-08-19 ENCOUNTER — Ambulatory Visit: Payer: 59 | Attending: Orthopedic Surgery | Admitting: Physical Therapy

## 2017-08-19 ENCOUNTER — Encounter: Payer: Self-pay | Admitting: Physical Therapy

## 2017-08-19 DIAGNOSIS — R262 Difficulty in walking, not elsewhere classified: Secondary | ICD-10-CM | POA: Diagnosis present

## 2017-08-19 DIAGNOSIS — M25562 Pain in left knee: Secondary | ICD-10-CM

## 2017-08-19 NOTE — Therapy (Signed)
[facility] [place], [place], [address] Phone: [phone]   Fax:  [phone]  Physical Therapy Treatment  Patient Details  Name: Roberto Lynch MRN: [id number] Date of Birth: 25-Jun-1955 Referring Provider: Noemi Chapel   Encounter Date: 08/19/2017  PT End of Session - 08/19/17 0824    Visit Number  26    Number of Visits  32    Date for PT Re-Evaluation  08/28/17    Authorization Type  FOTO every 5th visit    PT Start Time  0817    PT Stop Time  0859    PT Time Calculation (min)  42 min    Activity Tolerance  Patient tolerated treatment well    Behavior During Therapy  [facility] for tasks assessed/performed       Past Medical History:  Diagnosis Date  . Guillain Barr syndrome ([place])   . Hypertension     Past Surgical History:  Procedure Laterality Date  . CHOLECYSTECTOMY  1999  . TONSILLECTOMY  1969    There were no vitals filed for this visit.  Subjective Assessment - 08/19/17 0816    Subjective  Reports that his knee finally feels like it is getting better.    Pertinent History  COPD, HTN    Limitations  Walking;Standing    How long can you stand comfortably?  30 mins - 1hr    Diagnostic tests  MRI, x-ray    Patient Stated Goals  walk long distances, crawl, go up and down ladders, get back to where I was before    Currently in Pain?  Yes    Pain Score  1     Pain Location  Knee    Pain Orientation  Left    Pain Descriptors / Indicators  Discomfort    Pain Type  Chronic pain    Pain Onset  More than a month ago         [facility] PT Assessment - 08/19/17 0001      Assessment   Medical Diagnosis  L knee medial and lateral menisectomy and chondroplasty    Onset Date/Surgical Date  05/13/17    Next MD Visit  09/04/17    Prior Therapy  none      Restrictions   Weight Bearing Restrictions  No                   [facility] Adult PT Treatment/Exercise - 08/19/17 0001      Knee/Hip Exercises: Aerobic   Stationary Bike  Level 5 x15 minutes; LE only      Knee/Hip Exercises: Machines for Strengthening   Cybex Knee Extension  20# 3x10 reps    Cybex Knee Flexion  50# 3x10 reps LLE eccentric control training    Cybex Leg Press  2 pl, seat 7 x30 reps with ball squeeze      Knee/Hip Exercises: Standing   Terminal Knee Extension  Strengthening;Left;20 reps;Limitations    Terminal Knee Extension Limitations  Orange XTS with 5 sec hold    Step Down  Left;2 sets;10 reps;Hand Hold: 2;Step Height: 4" eccentric control training      Modalities   Modalities  Vasopneumatic      Vasopneumatic   Number Minutes Vasopneumatic   15 minutes    Vasopnuematic Location   Knee    Vasopneumatic Pressure  Low    Vasopneumatic Temperature   34               PT Short  Term Goals - 05/26/17 1153      PT SHORT TERM GOAL #1   Title  STG=LTG        PT Long Term Goals - 08/03/17 0925      PT LONG TERM GOAL #1   Title  Patient will improve L knee flexion strength to 5/5 to better perform functional activities.    Time  6    Period  Weeks    Status  Achieved      PT LONG TERM GOAL #2   Title  Patient will improve L SLS to greater than or equal to 10 seconds.    Baseline  9 seconds    Time  6    Period  Weeks    Status  Achieved      PT LONG TERM GOAL #3   Title  Patient will self report ability to walk community distances with less than or equal to 1/10 L knee pain in order to perform work duties.    Time  6    Period  Weeks    Status  Partially Met 1-2/10      PT LONG TERM GOAL #4   Title  Patient will be able to tolerate kneeling and quadruped position for greater than 30 minutes with less than or equal to 2/10 L knee pain in order to perform crawling for work activities.    Time  6    Status  Not Met 10-15 mins 2/10 pain      PT LONG TERM GOAL #5   Title  Patient will be able to stand with equal LE weight distribution for greater than 1 hour with less than or equal to 1/10 pain to  perform household activities.     Time  6    Period  Weeks    Status  Not Met      PT LONG TERM GOAL #6   Title  Patient will self report no pain with ascending and descending a ladder with reciprocating gait pattern in order to perform work activities.     Baseline  Patient able to do without pain    Time  6    Period  Weeks    Status  Achieved            Plan - 08/19/17 0854    Clinical Impression Statement  Patient tolerated today's treatment well with reports of improvement with L knee pain. Patient still limited with L knee pain at times as well as RLE due to continued swelling which limits R knee flexion for quadruped position. Minimal end range discomfort with 4" step down and heel dot. Limited ogal progression secondary to L knee discomfort as well as RLE swelling and discomfort. Normal modalities response noted following removal of the modalities.    Rehab Potential  Excellent    Clinical Impairments Affecting Rehab Potential  COPD, HTN    PT Frequency  2x / week    PT Duration  4 weeks    PT Treatment/Interventions  ADLs/Self Care Home Management;Electrical Stimulation;Iontophoresis 42m/ml Dexamethasone;Moist Heat;Cryotherapy;Ultrasound;Therapeutic exercise;Therapeutic activities;Functional mobility training;Stair training;Gait training;Balance training;Neuromuscular re-education;Patient/family education;Manual techniques;Passive range of motion;Dry needling;Vasopneumatic Device    PT Next Visit Plan   Continue eccentric Quad strengthening and work simulated exercises as able. modalities prn.    PT Home Exercise Plan  standing hip ABD, SLS    Consulted and Agree with Plan of Care  Patient       Patient will benefit from skilled therapeutic  intervention in order to improve the following deficits and impairments:  Abnormal gait, Decreased balance, Difficulty walking, Decreased strength, Pain  Visit Diagnosis: Left knee pain, unspecified chronicity  Difficulty in walking,  not elsewhere classified     Problem List Patient Active Problem List   Diagnosis Date Noted  . Medial meniscus tear 05/05/2017    Standley Brooking, PTA 08/19/2017, 9:52 AM  [facility] [address] [place], [place], [address] Phone: [phone]   Fax:  [phone]  Name: STERLING MONDO MRN: [id number] Date of Birth: Mar 15, 1956

## 2017-08-21 ENCOUNTER — Ambulatory Visit: Payer: 59 | Admitting: *Deleted

## 2017-08-21 DIAGNOSIS — R262 Difficulty in walking, not elsewhere classified: Secondary | ICD-10-CM

## 2017-08-21 DIAGNOSIS — M25562 Pain in left knee: Secondary | ICD-10-CM | POA: Diagnosis not present

## 2017-08-21 NOTE — Therapy (Signed)
[facility] [place], [place], [address] Phone: [phone]   Fax:  [phone]  Physical Therapy Treatment  Patient Details  Name: Roberto Lynch MRN: [id number] Date of Birth: 10/24/1955 Referring Provider: Noemi Chapel   Encounter Date: 08/21/2017  PT End of Session - 08/21/17 0906    Visit Number  27    Number of Visits  32    Date for PT Re-Evaluation  08/28/17    Authorization Type  FOTO every 5th visit    PT Start Time  0815    PT Stop Time  0915    PT Time Calculation (min)  60 min       Past Medical History:  Diagnosis Date  . Guillain Barr syndrome ([place])   . Hypertension     Past Surgical History:  Procedure Laterality Date  . CHOLECYSTECTOMY  1999  . TONSILLECTOMY  1969    There were no vitals filed for this visit.  Subjective Assessment - 08/21/17 0823    Subjective  Went to see [facility] and they will do [place] today in standing for both legs. Vein specialist said light ex was ok, but not anything heavy    Pertinent History  COPD, HTN    Limitations  Walking;Standing    How long can you stand comfortably?  30 mins - 1hr    Diagnostic tests  MRI, x-ray    Patient Stated Goals  walk long distances, crawl, go up and down ladders, get back to where I was before    Currently in Pain?  Yes    Pain Score  2     Pain Location  Knee    Pain Orientation  Left    Pain Descriptors / Indicators  Discomfort    Pain Onset  More than a month ago                       [facility] Adult PT Treatment/Exercise - 08/21/17 0001      Knee/Hip Exercises: Aerobic   Stationary Bike  Nustep Level 6 x15 minutes; LE only      Knee/Hip Exercises: Machines for Strengthening   Cybex Knee Extension  20# 3x10 reps    Cybex Knee Flexion  50# 3x10 reps    Cybex Leg Press  2 pl, seat 7 x30 reps with ball squeeze      Knee/Hip Exercises: Standing   Forward Step Up  Left;2 sets;10 reps;Hand Hold: 2;Step  Height: 4"    Step Down  Left;2 sets;10 reps;Hand Hold: 2;Step Height: 4" eccentric control training    Rocker Board  3 minutes balance      Modalities   Modalities  Vasopneumatic      Vasopneumatic   Number Minutes Vasopneumatic   15 minutes    Vasopnuematic Location   Knee    Vasopneumatic Pressure  Low    Vasopneumatic Temperature   34               PT Short Term Goals - 05/26/17 1153      PT SHORT TERM GOAL #1   Title  STG=LTG        PT Long Term Goals - 08/03/17 0925      PT LONG TERM GOAL #1   Title  Patient will improve L knee flexion strength to 5/5 to better perform functional activities.    Time  6    Period  Weeks    Status  Achieved      PT LONG TERM GOAL #2   Title  Patient will improve L SLS to greater than or equal to 10 seconds.    Baseline  9 seconds    Time  6    Period  Weeks    Status  Achieved      PT LONG TERM GOAL #3   Title  Patient will self report ability to walk community distances with less than or equal to 1/10 L knee pain in order to perform work duties.    Time  6    Period  Weeks    Status  Partially Met 1-2/10      PT LONG TERM GOAL #4   Title  Patient will be able to tolerate kneeling and quadruped position for greater than 30 minutes with less than or equal to 2/10 L knee pain in order to perform crawling for work activities.    Time  6    Status  Not Met 10-15 mins 2/10 pain      PT LONG TERM GOAL #5   Title  Patient will be able to stand with equal LE weight distribution for greater than 1 hour with less than or equal to 1/10 pain to perform household activities.     Time  6    Period  Weeks    Status  Not Met      PT LONG TERM GOAL #6   Title  Patient will self report no pain with ascending and descending a ladder with reciprocating gait pattern in order to perform work activities.     Baseline  Patient able to do without pain    Time  6    Period  Weeks    Status  Achieved            Plan - 08/21/17  0834    Clinical Impression Statement  Pt arrived today doing fairly well and reports having an [place] scheduled today in standing for RT LE. He visited a vein specialist on Wednesday and reports that he could continue with PT, but not to lift anything heavy. He was able to perform therex today, but continues to have most of his pain in LT knee in the last 20 degrees towards TKE going up and down steps. He did well with knee flexion and extension machine as well as leg press with minimal C/O pain.. Normal modality response today    Clinical Presentation  Stable    Clinical Decision Making  Low    Rehab Potential  Excellent    Clinical Impairments Affecting Rehab Potential  COPD, HTN    PT Frequency  2x / week    PT Duration  4 weeks    PT Treatment/Interventions  ADLs/Self Care Home Management;Electrical Stimulation;Iontophoresis 25m/ml Dexamethasone;Moist Heat;Cryotherapy;Ultrasound;Therapeutic exercise;Therapeutic activities;Functional mobility training;Stair training;Gait training;Balance training;Neuromuscular re-education;Patient/family education;Manual techniques;Passive range of motion;Dry needling;Vasopneumatic Device    PT Next Visit Plan   Continue eccentric Quad strengthening and work simulated exercises as able. modalities prn.    PT Home Exercise Plan  standing hip ABD, SLS    Consulted and Agree with Plan of Care  Patient       Patient will benefit from skilled therapeutic intervention in order to improve the following deficits and impairments:  Abnormal gait, Decreased balance, Difficulty walking, Decreased strength, Pain  Visit Diagnosis: Left knee pain, unspecified chronicity  Difficulty in walking, not elsewhere classified     Problem List Patient Active Problem List  Diagnosis Date Noted  . Medial meniscus tear 05/05/2017    RAMSEUR,CHRIS, PTA 08/21/2017, 11:23 AM  [facility] [place], [place], [address] Phone:  [phone]   Fax:  [phone]  Name: NIZAR CUTLER MRN: [id number] Date of Birth: 05-12-1955

## 2017-08-24 ENCOUNTER — Ambulatory Visit: Payer: 59 | Admitting: Physical Therapy

## 2017-08-24 ENCOUNTER — Encounter: Payer: Self-pay | Admitting: Physical Therapy

## 2017-08-24 DIAGNOSIS — M25562 Pain in left knee: Secondary | ICD-10-CM

## 2017-08-24 DIAGNOSIS — R262 Difficulty in walking, not elsewhere classified: Secondary | ICD-10-CM

## 2017-08-24 NOTE — Therapy (Signed)
[facility] [place], [place], [address] Phone: [phone]   Fax:  [phone]  Physical Therapy Treatment  Patient Details  Name: Roberto Lynch MRN: [id number] Date of Birth: [birth year] Referring Provider: Noemi Chapel   Encounter Date: 08/24/2017  PT End of Session - 08/24/17 1418    Visit Number  28    Number of Visits  32    Date for PT Re-Evaluation  08/28/17    Authorization Type  FOTO every 5th visit    PT Start Time  1347    PT Stop Time  1445    PT Time Calculation (min)  58 min    Activity Tolerance  Patient tolerated treatment well    Behavior During Therapy  [facility] for tasks assessed/performed       Past Medical History:  Diagnosis Date  . Guillain Barr syndrome ([place])   . Hypertension     Past Surgical History:  Procedure Laterality Date  . CHOLECYSTECTOMY  1999  . TONSILLECTOMY  1969    There were no vitals filed for this visit.  Subjective Assessment - 08/24/17 1349    Subjective  Patient arrived and reported he went to vein specialist and the doctor was not concered with his right leg, no restrictions or limitations per reported    Pertinent History  COPD, HTN    Limitations  Walking;Standing    How long can you stand comfortably?  30 mins - 1hr    Diagnostic tests  MRI, x-ray    Patient Stated Goals  walk long distances, crawl, go up and down ladders, get back to where I was before    Currently in Pain?  Yes    Pain Score  2     Pain Location  Knee    Pain Orientation  Left    Pain Descriptors / Indicators  Discomfort    Pain Type  Chronic pain    Pain Onset  More than a month ago    Pain Frequency  Intermittent    Aggravating Factors   increased activity    Pain Relieving Factors  at rest                       [facility] Adult PT Treatment/Exercise - 08/24/17 0001      Knee/Hip Exercises: Aerobic   Recumbent Bike  L5 x73mn      Knee/Hip Exercises: Machines for Strengthening   Cybex Knee Extension  20# 3x10 reps    Cybex Knee Flexion  50# 3x10 reps    Cybex Leg Press  2 pl, seat 7 x30 reps with ball squeeze      Knee/Hip Exercises: Standing   Forward Step Up  Left;2 sets;10 reps;Hand Hold: 2;Step Height: 4"    Step Down  Left;2 sets;10 reps;Hand Hold: 2;Step Height: 4"      Knee/Hip Exercises: Supine   Short Arc Quad Sets  Strengthening;20 reps;Limitations;Other (comment);Left    Short Arc Quad Sets Limitations  with 2# adduction squeeze for VMO activation      Vasopneumatic   Number Minutes Vasopneumatic   15 minutes    Vasopnuematic Location   Knee    Vasopneumatic Pressure  Low               PT Short Term Goals - 05/26/17 1153      PT SHORT TERM GOAL #1   Title  STG=LTG        PT Long Term Goals -  08/03/17 0925      PT LONG TERM GOAL #1   Title  Patient will improve L knee flexion strength to 5/5 to better perform functional activities.    Time  6    Period  Weeks    Status  Achieved      PT LONG TERM GOAL #2   Title  Patient will improve L SLS to greater than or equal to 10 seconds.    Baseline  9 seconds    Time  6    Period  Weeks    Status  Achieved      PT LONG TERM GOAL #3   Title  Patient will self report ability to walk community distances with less than or equal to 1/10 L knee pain in order to perform work duties.    Time  6    Period  Weeks    Status  Partially Met 1-2/10      PT LONG TERM GOAL #4   Title  Patient will be able to tolerate kneeling and quadruped position for greater than 30 minutes with less than or equal to 2/10 L knee pain in order to perform crawling for work activities.    Time  6    Status  Not Met 10-15 mins 2/10 pain      PT LONG TERM GOAL #5   Title  Patient will be able to stand with equal LE weight distribution for greater than 1 hour with less than or equal to 1/10 pain to perform household activities.     Time  6    Period  Weeks    Status  Not Met      PT LONG TERM GOAL #6   Title   Patient will self report no pain with ascending and descending a ladder with reciprocating gait pattern in order to perform work activities.     Baseline  Patient able to do without pain    Time  6    Period  Weeks    Status  Achieved            Plan - 08/24/17 1419    Clinical Impression Statement  Patient tolerated treatment well today and able to continue with current exercises and progress with knee stabilizing exercise. Patient has increased pain with activity and is unable to perform work activities such as climbing ladder or crawling on knees due to his current discomfort. Goals ongoing.     Rehab Potential  Excellent    Clinical Impairments Affecting Rehab Potential  COPD, HTN    PT Frequency  2x / week    PT Duration  4 weeks    PT Treatment/Interventions  ADLs/Self Care Home Management;Electrical Stimulation;Iontophoresis 61m/ml Dexamethasone;Moist Heat;Cryotherapy;Ultrasound;Therapeutic exercise;Therapeutic activities;Functional mobility training;Stair training;Gait training;Balance training;Neuromuscular re-education;Patient/family education;Manual techniques;Passive range of motion;Dry needling;Vasopneumatic Device    PT Next Visit Plan   Continue eccentric Quad strengthening and work simulated exercises as able. modalities prn.    Consulted and Agree with Plan of Care  Patient       Patient will benefit from skilled therapeutic intervention in order to improve the following deficits and impairments:  Abnormal gait, Decreased balance, Difficulty walking, Decreased strength, Pain  Visit Diagnosis: Left knee pain, unspecified chronicity  Difficulty in walking, not elsewhere classified     Problem List Patient Active Problem List   Diagnosis Date Noted  . Medial meniscus tear 05/05/2017    ,  P, PTA 08/24/2017, 2:54 PM  [facility] [place], [place], [address] Phone: [phone]   Fax:   [phone]  Name: Roberto Lynch MRN: [id number] Date of Birth: 02-22-1956

## 2017-08-26 ENCOUNTER — Encounter: Payer: Self-pay | Admitting: Physical Therapy

## 2017-08-26 ENCOUNTER — Ambulatory Visit: Payer: 59 | Admitting: Physical Therapy

## 2017-08-26 DIAGNOSIS — R262 Difficulty in walking, not elsewhere classified: Secondary | ICD-10-CM

## 2017-08-26 DIAGNOSIS — M25562 Pain in left knee: Secondary | ICD-10-CM

## 2017-08-26 NOTE — Therapy (Signed)
[facility] [place], [place], [address] Phone: [phone]   Fax:  [phone]  Physical Therapy Treatment  Patient Details  Name: Roberto Lynch MRN: [id number] Date of Birth: [birth year] Referring Provider: Noemi Chapel   Encounter Date: 08/26/2017  PT End of Session - 08/26/17 0836    Visit Number  29    Number of Visits  32    Date for PT Re-Evaluation  08/28/17    Authorization Type  FOTO every 5th visit    PT Start Time  0818    PT Stop Time  0909    PT Time Calculation (min)  51 min    Activity Tolerance  Patient tolerated treatment well    Behavior During Therapy  [facility] for tasks assessed/performed       Past Medical History:  Diagnosis Date  . Guillain Barr syndrome ([place])   . Hypertension     Past Surgical History:  Procedure Laterality Date  . CHOLECYSTECTOMY  1999  . TONSILLECTOMY  1969    There were no vitals filed for this visit.  Subjective Assessment - 08/26/17 0835    Subjective  Reports that he is still taking medication for DVT but MD not worried about it.    Pertinent History  COPD, HTN    Limitations  Walking;Standing    How long can you stand comfortably?  30 mins - 1hr    Diagnostic tests  MRI, x-ray    Patient Stated Goals  walk long distances, crawl, go up and down ladders, get back to where I was before    Currently in Pain?  Yes    Pain Score  2     Pain Location  Knee    Pain Orientation  Left    Pain Descriptors / Indicators  Discomfort    Pain Type  Chronic pain    Pain Onset  More than a month ago         [facility] PT Assessment - 08/26/17 0001      Assessment   Medical Diagnosis  L knee medial and lateral menisectomy and chondroplasty    Onset Date/Surgical Date  05/13/17    Next MD Visit  09/04/17    Prior Therapy  none      Restrictions   Weight Bearing Restrictions  No                   [facility] Adult PT Treatment/Exercise - 08/26/17 0001      Knee/Hip  Exercises: Aerobic   Recumbent Bike  L5 x4mn      Knee/Hip Exercises: Machines for Strengthening   Cybex Knee Extension  20# 3x10 reps eccentric control and strengthening    Cybex Knee Flexion  50# 3x10 reps eccentric control and strengthening      Knee/Hip Exercises: Standing   Lateral Step Up  Left;20 reps;Hand Hold: 2;Step Height: 8"    Forward Step Up  Left;20 reps;Hand Hold: 2;Step Height: 6" 10"    Step Down  Left;20 reps;Hand Hold: 2;Step Height: 6"      Knee/Hip Exercises: Supine   Single Leg Bridge  Strengthening;Left;10 reps    Straight Leg Raise with External Rotation  Strengthening;Left;20 reps      Modalities   Modalities  Vasopneumatic      Vasopneumatic   Number Minutes Vasopneumatic   15 minutes    Vasopnuematic Location   Knee    Vasopneumatic Pressure  Low    Vasopneumatic Temperature  34               PT Short Term Goals - 05/26/17 1153      PT SHORT TERM GOAL #1   Title  STG=LTG        PT Long Term Goals - 08/03/17 0925      PT LONG TERM GOAL #1   Title  Patient will improve L knee flexion strength to 5/5 to better perform functional activities.    Time  6    Period  Weeks    Status  Achieved      PT LONG TERM GOAL #2   Title  Patient will improve L SLS to greater than or equal to 10 seconds.    Baseline  9 seconds    Time  6    Period  Weeks    Status  Achieved      PT LONG TERM GOAL #3   Title  Patient will self report ability to walk community distances with less than or equal to 1/10 L knee pain in order to perform work duties.    Time  6    Period  Weeks    Status  Partially Met 1-2/10      PT LONG TERM GOAL #4   Title  Patient will be able to tolerate kneeling and quadruped position for greater than 30 minutes with less than or equal to 2/10 L knee pain in order to perform crawling for work activities.    Time  6    Status  Not Met 10-15 mins 2/10 pain      PT LONG TERM GOAL #5   Title  Patient will be able to stand  with equal LE weight distribution for greater than 1 hour with less than or equal to 1/10 pain to perform household activities.     Time  6    Period  Weeks    Status  Not Met      PT LONG TERM GOAL #6   Title  Patient will self report no pain with ascending and descending a ladder with reciprocating gait pattern in order to perform work activities.     Baseline  Patient able to do without pain    Time  6    Period  Weeks    Status  Achieved            Plan - 08/26/17 0903    Clinical Impression Statement  Patient tolerated today's treatment well with low level L knee pain upon arrival. Patient progressed to eccentric control and strengthening of LLE today with machine strengtheing without complaint. Patient guided through various step heights to progress to ladder height. Minimal pain in L knee reported with lateral step up to 8" step. Patient demonstrated evident pelvic instability and weakness with L SL bridge. Patient instructed by MD to not climb ladders but could return to work but has to climb ladders throughout the work day. Normal vasopnuematic response noted following removal of the modalities.    Rehab Potential  Excellent    Clinical Impairments Affecting Rehab Potential  COPD, HTN    PT Frequency  2x / week    PT Duration  4 weeks    PT Treatment/Interventions  ADLs/Self Care Home Management;Electrical Stimulation;Iontophoresis 26m/ml Dexamethasone;Moist Heat;Cryotherapy;Ultrasound;Therapeutic exercise;Therapeutic activities;Functional mobility training;Stair training;Gait training;Balance training;Neuromuscular re-education;Patient/family education;Manual techniques;Passive range of motion;Dry needling;Vasopneumatic Device    PT Next Visit Plan   Continue eccentric Quad strengthening and work simulated exercises as able.  modalities prn.    PT Home Exercise Plan  standing hip ABD, SLS    Consulted and Agree with Plan of Care  Patient       Patient will benefit from  skilled therapeutic intervention in order to improve the following deficits and impairments:  Abnormal gait, Decreased balance, Difficulty walking, Decreased strength, Pain  Visit Diagnosis: Left knee pain, unspecified chronicity  Difficulty in walking, not elsewhere classified     Problem List Patient Active Problem List   Diagnosis Date Noted  . Medial meniscus tear 05/05/2017    Standley Brooking, PTA 08/26/2017, 9:25 AM  [facility] [address] [place], [place], [address] Phone: [phone]   Fax:  [phone]  Name: Roberto Lynch MRN: [id number] Date of Birth: 1955/12/26

## 2017-08-28 ENCOUNTER — Encounter: Payer: Self-pay | Admitting: Physical Therapy

## 2017-08-28 ENCOUNTER — Ambulatory Visit: Payer: 59 | Admitting: Physical Therapy

## 2017-08-28 DIAGNOSIS — R262 Difficulty in walking, not elsewhere classified: Secondary | ICD-10-CM

## 2017-08-28 DIAGNOSIS — M25562 Pain in left knee: Secondary | ICD-10-CM

## 2017-08-28 NOTE — Therapy (Signed)
[facility] [place], [place], [address] Phone: [phone]   Fax:  [phone]  Physical Therapy Treatment  Patient Details  Name: Roberto Lynch MRN: [id number] Date of Birth: 11-Jan-1956 Referring Provider: Noemi Chapel   Encounter Date: 08/28/2017  PT End of Session - 08/28/17 0818    Visit Number  30    Number of Visits  32    Date for PT Re-Evaluation  08/28/17    Authorization Type  FOTO every 5th visit    PT Start Time  0817    PT Stop Time  0904    PT Time Calculation (min)  47 min    Activity Tolerance  Patient tolerated treatment well    Behavior During Therapy  [facility] for tasks assessed/performed       Past Medical History:  Diagnosis Date  . Guillain Barr syndrome ([place])   . Hypertension     Past Surgical History:  Procedure Laterality Date  . CHOLECYSTECTOMY  1999  . TONSILLECTOMY  1969    There were no vitals filed for this visit.  Subjective Assessment - 08/28/17 0818    Subjective  Reports that his knee felt good upon arrival but still hurts like usual.    Pertinent History  COPD, HTN    Limitations  Walking;Standing    How long can you stand comfortably?  30 mins - 1hr    Diagnostic tests  MRI, x-ray    Patient Stated Goals  walk long distances, crawl, go up and down ladders, get back to where I was before    Currently in Pain?  Yes    Pain Score  2     Pain Location  Knee    Pain Orientation  Left    Pain Descriptors / Indicators  Discomfort    Pain Type  Chronic pain    Pain Onset  More than a month ago         [facility] PT Assessment - 08/28/17 0001      Assessment   Medical Diagnosis  L knee medial and lateral menisectomy and chondroplasty    Onset Date/Surgical Date  05/13/17    Next MD Visit  09/04/17    Prior Therapy  none      Restrictions   Weight Bearing Restrictions  No         Progress Note Reporting Period 07/29/17 to 08/28/17  See note below for Objective Data and  Assessment of Progress/Goals.                [place] Adult PT Treatment/Exercise - 08/28/17 0001      Knee/Hip Exercises: Aerobic   Recumbent Bike  L5 x19mn      Knee/Hip Exercises: Machines for Strengthening   Cybex Knee Extension  20# 3x10 reps for eccentric strengthening and control    Cybex Knee Flexion  50# 3x10 reps for eccentric control and strnegthening    Cybex Leg Press  2 pl, seat 8, 2x10 reps for eccentric strengthening      Knee/Hip Exercises: Standing   Lateral Step Up  Left;20 reps;Hand Hold: 2;Step Height: 8"    Forward Step Up  Left;2 sets;10 reps;Hand Hold: 2;Limitations    Forward Step Up Limitations  10" step    Step Down  Left;20 reps;Hand Hold: 2;Step Height: 4" Heel dot      Modalities   Modalities  Vasopneumatic      Vasopneumatic   Number Minutes Vasopneumatic   15 minutes  Vasopnuematic Location   Knee    Vasopneumatic Pressure  Low    Vasopneumatic Temperature   34               PT Short Term Goals - 05/26/17 1153      PT SHORT TERM GOAL #1   Title  STG=LTG        PT Long Term Goals - 08/03/17 0925      PT LONG TERM GOAL #1   Title  Patient will improve L knee flexion strength to 5/5 to better perform functional activities.    Time  6    Period  Weeks    Status  Achieved      PT LONG TERM GOAL #2   Title  Patient will improve L SLS to greater than or equal to 10 seconds.    Baseline  9 seconds    Time  6    Period  Weeks    Status  Achieved      PT LONG TERM GOAL #3   Title  Patient will self report ability to walk community distances with less than or equal to 1/10 L knee pain in order to perform work duties.    Time  6    Period  Weeks    Status  Partially Met 1-2/10      PT LONG TERM GOAL #4   Title  Patient will be able to tolerate kneeling and quadruped position for greater than 30 minutes with less than or equal to 2/10 L knee pain in order to perform crawling for work activities.    Time  6    Status   Not Met 10-15 mins 2/10 pain      PT LONG TERM GOAL #5   Title  Patient will be able to stand with equal LE weight distribution for greater than 1 hour with less than or equal to 1/10 pain to perform household activities.     Time  6    Period  Weeks    Status  Not Met      PT LONG TERM GOAL #6   Title  Patient will self report no pain with ascending and descending a ladder with reciprocating gait pattern in order to perform work activities.     Baseline  Patient able to do without pain    Time  6    Period  Weeks    Status  Achieved            Plan - 08/28/17 0912    Clinical Impression Statement  Patient tolerated today's treatment fairly well as more emphasis placed on eccentric strengthening and control of LLE. RLE still observed swollen even through his pants leg and compression hose donned. No complaints of pain reported by patient with exercises other than increased difficulty with eccentric control with LLE leg press. Normal vasopneumatic response noted following removal of the modality.    Rehab Potential  Excellent    Clinical Impairments Affecting Rehab Potential  COPD, HTN    PT Frequency  2x / week    PT Duration  4 weeks    PT Treatment/Interventions  ADLs/Self Care Home Management;Electrical Stimulation;Iontophoresis 44m/ml Dexamethasone;Moist Heat;Cryotherapy;Ultrasound;Therapeutic exercise;Therapeutic activities;Functional mobility training;Stair training;Gait training;Balance training;Neuromuscular re-education;Patient/family education;Manual techniques;Passive range of motion;Dry needling;Vasopneumatic Device    PT Next Visit Plan   Continue eccentric Quad strengthening and work simulated exercises as able. modalities prn.    PT Home Exercise Plan  standing hip ABD, SLS  Consulted and Agree with Plan of Care  Patient       Patient will benefit from skilled therapeutic intervention in order to improve the following deficits and impairments:  Abnormal gait,  Decreased balance, Difficulty walking, Decreased strength, Pain  Visit Diagnosis: Left knee pain, unspecified chronicity  Difficulty in walking, not elsewhere classified     Problem List Patient Active Problem List   Diagnosis Date Noted  . Medial meniscus tear 05/05/2017    Standley Brooking, PTA 08/28/2017, 9:18 AM  [facility] [place], [place], [address] Phone: [phone]   Fax:  [phone]  Name: Roberto Lynch MRN: [id number] Date of Birth: 1956/02/27

## 2017-08-31 ENCOUNTER — Ambulatory Visit: Payer: 59 | Admitting: Physical Therapy

## 2017-08-31 ENCOUNTER — Encounter: Payer: Self-pay | Admitting: Physical Therapy

## 2017-08-31 DIAGNOSIS — M25562 Pain in left knee: Secondary | ICD-10-CM

## 2017-08-31 DIAGNOSIS — R262 Difficulty in walking, not elsewhere classified: Secondary | ICD-10-CM

## 2017-08-31 NOTE — Therapy (Signed)
[facility] [place], [place], [address] Phone: [phone]   Fax:  [phone]  Physical Therapy Treatment  Patient Details  Name: Roberto Lynch MRN: [id number] Date of Birth: Oct 12, 1955 Referring Provider: Noemi Chapel   Encounter Date: 08/31/2017  PT End of Session - 08/31/17 0826    Visit Number  31    Number of Visits  32    Date for PT Re-Evaluation  08/28/17    Authorization Type  FOTO every 5th visit    PT Start Time  0818    PT Stop Time  0908    PT Time Calculation (min)  50 min    Activity Tolerance  Patient tolerated treatment well    Behavior During Therapy  [facility] for tasks assessed/performed       Past Medical History:  Diagnosis Date  . Guillain Barr syndrome ([place])   . Hypertension     Past Surgical History:  Procedure Laterality Date  . CHOLECYSTECTOMY  1999  . TONSILLECTOMY  1969    There were no vitals filed for this visit.  Subjective Assessment - 08/31/17 0825    Subjective  Reports that his knee felt good upon arrival but still hurts like usual.    Pertinent History  COPD, HTN    Limitations  Walking;Standing    How long can you stand comfortably?  30 mins - 1hr    Diagnostic tests  MRI, x-ray    Patient Stated Goals  walk long distances, crawl, go up and down ladders, get back to where I was before    Currently in Pain?  Yes    Pain Score  2     Pain Location  Knee    Pain Orientation  Left    Pain Descriptors / Indicators  Discomfort    Pain Type  Chronic pain    Pain Onset  More than a month ago    Pain Frequency  Intermittent    Aggravating Factors   increased activity    Pain Relieving Factors  at rest                       [facility] Adult PT Treatment/Exercise - 08/31/17 0001      Knee/Hip Exercises: Aerobic   Recumbent Bike  L5 x57mn      Knee/Hip Exercises: Machines for Strengthening   Cybex Knee Extension  20# 3x10 reps eccentric focus    Cybex Knee Flexion   50# 3x10 reps eccentric focus    Cybex Leg Press  2 pl, seat 8, 2x10 reps for eccentric strengthening      Knee/Hip Exercises: Standing   Forward Step Up  Left;2 sets;10 reps;Hand Hold: 2;Limitations;Step Height: 6"    Step Down  Left;20 reps;Hand Hold: 2;Step Height: 6"      Vasopneumatic   Number Minutes Vasopneumatic   15 minutes    Vasopnuematic Location   Knee    Vasopneumatic Pressure  Low               PT Short Term Goals - 05/26/17 1153      PT SHORT TERM GOAL #1   Title  STG=LTG        PT Long Term Goals - 08/31/17 0836      PT LONG TERM GOAL #1   Title  Patient will improve L knee flexion strength to 5/5 to better perform functional activities.    Time  6    Period  Weeks    Status  Achieved      PT LONG TERM GOAL #2   Title  Patient will improve L SLS to greater than or equal to 10 seconds.    Baseline  9 seconds    Time  6    Period  Weeks    Status  Achieved      PT LONG TERM GOAL #3   Title  Patient will self report ability to walk community distances with less than or equal to 1/10 L knee pain in order to perform work duties.    Baseline  4/10 after grocery shopping    Time  6    Period  Weeks    Status  Not Met 1-2/10 08/31/17      PT LONG TERM GOAL #4   Title  Patient will be able to tolerate kneeling and quadruped position for greater than 30 minutes with less than or equal to 2/10 L knee pain in order to perform crawling for work activities.    Time  6    Period  Weeks    Status  Not Met 10-169mn 2/10 pain      PT LONG TERM GOAL #5   Title  Patient will be able to stand with equal LE weight distribution for greater than 1 hour with less than or equal to 1/10 pain to perform household activities.     Time  6    Period  Weeks    Status  Not Met      PT LONG TERM GOAL #6   Title  Patient will self report no pain with ascending and descending a ladder with reciprocating gait pattern in order to perform work activities.     Time  6     Period  Weeks    Status  Achieved            Plan - 08/31/17 06060   Clinical Impression Statement  Patient tolerated treatment well today with ongoing discomfort. Patient unable to perform work activities due to pain in left knee. Patient only able to tolerate QP position 10-15 min with2/10 pain in knee. Patient has difficulty with up/down stairs and ladder due to discomfort. Patient has reported 70% improvement overall yet ongoing limitations. FOTO score 52% limitation (62% initial) Unable to meet all goals.     Rehab Potential  Excellent    Clinical Impairments Affecting Rehab Potential  COPD, HTN    PT Frequency  2x / week    PT Duration  4 weeks    PT Treatment/Interventions  ADLs/Self Care Home Management;Electrical Stimulation;Iontophoresis 459mml Dexamethasone;Moist Heat;Cryotherapy;Ultrasound;Therapeutic exercise;Therapeutic activities;Functional mobility training;Stair training;Gait training;Balance training;Neuromuscular re-education;Patient/family education;Manual techniques;Passive range of motion;Dry needling;Vasopneumatic Device    PT Next Visit Plan  Pending DC going to MD to return to work per his discression    Consulted and Agree with Plan of Care  Patient       Patient will benefit from skilled therapeutic intervention in order to improve the following deficits and impairments:  Abnormal gait, Decreased balance, Difficulty walking, Decreased strength, Pain  Visit Diagnosis: Left knee pain, unspecified chronicity  Difficulty in walking, not elsewhere classified     Problem List Patient Active Problem List   Diagnosis Date Noted  . Medial meniscus tear 05/05/2017    ChLadean RayaPTA 08/31/17 9:18 AM  [facility] [address] [id number]: [phone] Fax:  [phone]: Roberto Lynch  MRN: [id number] Date of Birth: [birth year]

## 2017-09-07 ENCOUNTER — Ambulatory Visit: Payer: 59 | Admitting: Physical Therapy

## 2017-09-07 ENCOUNTER — Encounter: Payer: Self-pay | Admitting: Physical Therapy

## 2017-09-07 DIAGNOSIS — M25562 Pain in left knee: Secondary | ICD-10-CM

## 2017-09-07 DIAGNOSIS — R262 Difficulty in walking, not elsewhere classified: Secondary | ICD-10-CM

## 2017-09-07 NOTE — Therapy (Signed)
[facility] [place], [place], [address] Phone: [phone]   Fax:  [phone]  Physical Therapy Treatment  Patient Details  Name: Roberto Lynch MRN: [id number] Date of Birth: 1955/06/06 Referring Provider: Noemi Chapel   Encounter Date: 09/07/2017  PT End of Session - 09/07/17 0859    Visit Number  32    Number of Visits  32    Date for PT Re-Evaluation  08/28/17    Authorization Type  FOTO every 5th visit    PT Start Time  0814    PT Stop Time  0915    PT Time Calculation (min)  61 min    Activity Tolerance  Patient tolerated treatment well    Behavior During Therapy  [facility] for tasks assessed/performed       Past Medical History:  Diagnosis Date  . Guillain Barr syndrome ([place])   . Hypertension     Past Surgical History:  Procedure Laterality Date  . CHOLECYSTECTOMY  1999  . TONSILLECTOMY  1969    There were no vitals filed for this visit.  Subjective Assessment - 09/07/17 0816    Subjective  Patient reported that he went to MD and is to continue therapy and work on machines to strengthen his knee    Pertinent History  COPD, HTN    Limitations  Walking;Standing    How long can you stand comfortably?  30 mins - 1hr    Diagnostic tests  MRI, x-ray    Patient Stated Goals  walk long distances, crawl, go up and down ladders, get back to where I was before    Currently in Pain?  Yes    Pain Score  2     Pain Location  Knee    Pain Orientation  Left    Pain Descriptors / Indicators  Discomfort    Pain Type  Chronic pain    Pain Onset  More than a month ago    Pain Frequency  Intermittent    Aggravating Factors   increased activity    Pain Relieving Factors  at rest                       [facility] Adult PT Treatment/Exercise - 09/07/17 0001      Knee/Hip Exercises: Aerobic   Recumbent Bike  L6 x33mn LE only      Knee/Hip Exercises: Machines for Strengthening   Cybex Knee Extension  20# 3x10 reps  eccentric focus    Cybex Knee Flexion  50# 3x10 reps eccentric focus    Cybex Leg Press  2 pl, seat 7, 3x10 reps for eccentric strengthening    Hip Cybex  4 way standing Left LE 20# x10each      Knee/Hip Exercises: Standing   Step Down  Left;3 sets;10 reps;Step Height: 4" eccentric lowering      Knee/Hip Exercises: Seated   Sit to Sand  20 reps;without UE support and R LE forward L LE back      Modalities   Modalities  Electrical Stimulation;Vasopneumatic      Electrical Stimulation   Electrical Stimulation Location  left VMO/quad    Electrical Stimulation Action  single 10/10 x161m with SAQ for activation    Electrical Stimulation Goals  Neuromuscular facilitation;Strength      Vasopneumatic   Number Minutes Vasopneumatic   15 minutes    Vasopnuematic Location   Knee    Vasopneumatic Pressure  Low  PT Short Term Goals - 05/26/17 1153      PT SHORT TERM GOAL #1   Title  STG=LTG        PT Long Term Goals - 08/31/17 0836      PT LONG TERM GOAL #1   Title  Patient will improve L knee flexion strength to 5/5 to better perform functional activities.    Time  6    Period  Weeks    Status  Achieved      PT LONG TERM GOAL #2   Title  Patient will improve L SLS to greater than or equal to 10 seconds.    Baseline  9 seconds    Time  6    Period  Weeks    Status  Achieved      PT LONG TERM GOAL #3   Title  Patient will self report ability to walk community distances with less than or equal to 1/10 L knee pain in order to perform work duties.    Baseline  4/10 after grocery shopping    Time  6    Period  Weeks    Status  Not Met 1-2/10 08/31/17      PT LONG TERM GOAL #4   Title  Patient will be able to tolerate kneeling and quadruped position for greater than 30 minutes with less than or equal to 2/10 L knee pain in order to perform crawling for work activities.    Time  6    Period  Weeks    Status  Not Met 10-140mn 2/10 pain      PT LONG TERM  GOAL #5   Title  Patient will be able to stand with equal LE weight distribution for greater than 1 hour with less than or equal to 1/10 pain to perform household activities.     Time  6    Period  Weeks    Status  Not Met      PT LONG TERM GOAL #6   Title  Patient will self report no pain with ascending and descending a ladder with reciprocating gait pattern in order to perform work activities.     Time  6    Period  Weeks    Status  Achieved            Plan - 09/07/17 0900    Clinical Impression Statement  Patient tolerated treatment well today. Patient able to progress with left LE strengthening exercises today and added VMS to VMO with SAQ for activation. Patient has some ongoing discomfort overall and difficulty with eccentric lowering. Goals ongoing.     Rehab Potential  Excellent    Clinical Impairments Affecting Rehab Potential  COPD, HTN    PT Frequency  2x / week    PT Duration  4 weeks    PT Treatment/Interventions  ADLs/Self Care Home Management;Electrical Stimulation;Iontophoresis 461mml Dexamethasone;Moist Heat;Cryotherapy;Ultrasound;Therapeutic exercise;Therapeutic activities;Functional mobility training;Stair training;Gait training;Balance training;Neuromuscular re-education;Patient/family education;Manual techniques;Passive range of motion;Dry needling;Vasopneumatic Device    PT Next Visit Plan  cont with Left LE strengthening per MD, re-cert sent awaiting signature    Consulted and Agree with Plan of Care  Patient       Patient will benefit from skilled therapeutic intervention in order to improve the following deficits and impairments:  Abnormal gait, Decreased balance, Difficulty walking, Decreased strength, Pain  Visit Diagnosis: Left knee pain, unspecified chronicity  Difficulty in walking, not elsewhere classified     Problem List Patient Active  Problem List   Diagnosis Date Noted  . Medial meniscus tear 05/05/2017    Ladean Raya,  PTA 09/07/17 9:35 AM  [facility] [place], [place], [address] Phone: [phone]   Fax:  [phone]  Name: DEVINE KLINGEL MRN: [id number] Date of Birth: January 30, 1956

## 2017-09-09 ENCOUNTER — Encounter: Payer: Self-pay | Admitting: Physical Therapy

## 2017-09-09 ENCOUNTER — Ambulatory Visit: Payer: 59 | Admitting: Physical Therapy

## 2017-09-09 DIAGNOSIS — R262 Difficulty in walking, not elsewhere classified: Secondary | ICD-10-CM

## 2017-09-09 DIAGNOSIS — M25562 Pain in left knee: Secondary | ICD-10-CM | POA: Diagnosis not present

## 2017-09-09 NOTE — Therapy (Signed)
[facility] [place], [place], [address] Phone: [phone]   Fax:  [phone]  Physical Therapy Treatment  Patient Details  Name: Roberto Lynch MRN: [id number] Date of Birth: [birth year] Referring Provider: Noemi Chapel   Encounter Date: 09/09/2017  PT End of Session - 09/09/17 0829    Visit Number  33    Number of Visits  42    Date for PT Re-Evaluation  10/06/17    Authorization Type  FOTO every 5th visit    PT Start Time  0814    PT Stop Time  0914    PT Time Calculation (min)  60 min    Activity Tolerance  Patient tolerated treatment well    Behavior During Therapy  [facility] for tasks assessed/performed       Past Medical History:  Diagnosis Date  . Guillain Barr syndrome ([place])   . Hypertension     Past Surgical History:  Procedure Laterality Date  . CHOLECYSTECTOMY  1999  . TONSILLECTOMY  1969    There were no vitals filed for this visit.  Subjective Assessment - 09/09/17 0816    Subjective  Patient arrived with no new complaints, did good after last treatment    Pertinent History  COPD, HTN    Limitations  Walking;Standing    How long can you stand comfortably?  30 mins - 1hr    Diagnostic tests  MRI, x-ray    Patient Stated Goals  walk long distances, crawl, go up and down ladders, get back to where I was before    Currently in Pain?  Yes    Pain Score  2     Pain Location  Knee    Pain Orientation  Left    Pain Descriptors / Indicators  Discomfort    Pain Type  Chronic pain    Pain Onset  More than a month ago    Pain Frequency  Intermittent    Aggravating Factors   increased activity    Pain Relieving Factors  at rest                       [facility] Adult PT Treatment/Exercise - 09/09/17 0001      Knee/Hip Exercises: Aerobic   Recumbent Bike  L6 x65mn LE only      Knee/Hip Exercises: Machines for Strengthening   Cybex Knee Extension  20# 3x10 reps eccentic focus    Cybex Knee  Flexion  50# 3x10 reps eccentric focus    Cybex Leg Press  2 pl, seat 7, 3x10 reps for eccentric strengthening    Hip Cybex  4 way standing Left LE 30# x10each      Electrical Stimulation   Electrical Stimulation Location  left VMO/quad    Electrical Stimulation Action  10/10 x127m with SAQ for activation    Electrical Stimulation Goals  Neuromuscular facilitation;Strength      Vasopneumatic   Number Minutes Vasopneumatic   15 minutes    Vasopnuematic Location   Knee    Vasopneumatic Pressure  Low               PT Short Term Goals - 05/26/17 1153      PT SHORT TERM GOAL #1   Title  STG=LTG        PT Long Term Goals - 08/31/17 0836      PT LONG TERM GOAL #1   Title  Patient will improve L knee flexion strength to  5/5 to better perform functional activities.    Time  6    Period  Weeks    Status  Achieved      PT LONG TERM GOAL #2   Title  Patient will improve L SLS to greater than or equal to 10 seconds.    Baseline  9 seconds    Time  6    Period  Weeks    Status  Achieved      PT LONG TERM GOAL #3   Title  Patient will self report ability to walk community distances with less than or equal to 1/10 L knee pain in order to perform work duties.    Baseline  4/10 after grocery shopping    Time  6    Period  Weeks    Status  Not Met 1-2/10 08/31/17      PT LONG TERM GOAL #4   Title  Patient will be able to tolerate kneeling and quadruped position for greater than 30 minutes with less than or equal to 2/10 L knee pain in order to perform crawling for work activities.    Time  6    Period  Weeks    Status  Not Met 10-144mn 2/10 pain      PT LONG TERM GOAL #5   Title  Patient will be able to stand with equal LE weight distribution for greater than 1 hour with less than or equal to 1/10 pain to perform household activities.     Time  6    Period  Weeks    Status  Not Met      PT LONG TERM GOAL #6   Title  Patient will self report no pain with ascending and  descending a ladder with reciprocating gait pattern in order to perform work activities.     Time  6    Period  Weeks    Status  Achieved            Plan - 09/09/17 09629   Clinical Impression Statement  Patient tolerated last treatment very well had reported no pain that evening. Patient progressing with new exercises and able to complete. Patient continues to have weakness with SLS and contibues to progress with eccentric exercises and VMS to strengthen quad. Goals progressing.     Rehab Potential  Excellent    Clinical Impairments Affecting Rehab Potential  COPD, HTN    PT Frequency  2x / week    PT Duration  4 weeks    PT Treatment/Interventions  ADLs/Self Care Home Management;Electrical Stimulation;Iontophoresis '4mg'$ /ml Dexamethasone;Moist Heat;Cryotherapy;Ultrasound;Therapeutic exercise;Therapeutic activities;Functional mobility training;Stair training;Gait training;Balance training;Neuromuscular re-education;Patient/family education;Manual techniques;Passive range of motion;Dry needling;Vasopneumatic Device    PT Next Visit Plan  cont with Left LE strengthening per MD, re-cert sent awaiting signature    Consulted and Agree with Plan of Care  Patient       Patient will benefit from skilled therapeutic intervention in order to improve the following deficits and impairments:  Abnormal gait, Decreased balance, Difficulty walking, Decreased strength, Pain  Visit Diagnosis: Left knee pain, unspecified chronicity  Difficulty in walking, not elsewhere classified     Problem List Patient Active Problem List   Diagnosis Date Noted  . Medial meniscus tear 05/05/2017    Kunta Hilleary P, PTA 09/09/2017, 9:27 AM  [facility] [address]Phone: [phone]  Fax:  [phone] Name: MMATTHEO SWINDLEMRN: [id number] of Birth:  04/24/1955   

## 2017-09-11 ENCOUNTER — Ambulatory Visit: Payer: 59 | Admitting: Physical Therapy

## 2017-09-16 ENCOUNTER — Ambulatory Visit: Payer: 59 | Admitting: Physical Therapy

## 2017-09-16 ENCOUNTER — Encounter: Payer: Self-pay | Admitting: Physical Therapy

## 2017-09-16 DIAGNOSIS — M25562 Pain in left knee: Secondary | ICD-10-CM

## 2017-09-16 DIAGNOSIS — R262 Difficulty in walking, not elsewhere classified: Secondary | ICD-10-CM

## 2017-09-16 NOTE — Therapy (Signed)
[facility] [place], [place], [address] Phone: [phone]   Fax:  [phone]  Physical Therapy Treatment  Patient Details  Name: Roberto Lynch MRN: [id number] Date of Birth: 1956-02-27 Referring Provider: Noemi Chapel   Encounter Date: 09/16/2017  PT End of Session - 09/16/17 0820    Visit Number  34    Number of Visits  42    Date for PT Re-Evaluation  10/06/17    Authorization Type  FOTO every 5th visit    PT Start Time  0819    PT Stop Time  0909    PT Time Calculation (min)  50 min    Activity Tolerance  Patient tolerated treatment well    Behavior During Therapy  [facility] for tasks assessed/performed       Past Medical History:  Diagnosis Date  . Guillain Barr syndrome ([place])   . Hypertension     Past Surgical History:  Procedure Laterality Date  . CHOLECYSTECTOMY  1999  . TONSILLECTOMY  1969    There were no vitals filed for this visit.  Subjective Assessment - 09/16/17 0819    Subjective  Reports that he went to the chiropractor for his feet but has not tried a ladder recently.    Pertinent History  COPD, HTN    Limitations  Walking;Standing    How long can you stand comfortably?  30 mins - 1hr    Diagnostic tests  MRI, x-ray    Patient Stated Goals  walk long distances, crawl, go up and down ladders, get back to where I was before    Currently in Pain?  Yes    Pain Score  2     Pain Location  Knee    Pain Orientation  Left    Pain Descriptors / Indicators  Discomfort    Pain Type  Chronic pain    Pain Onset  More than a month ago         [facility] PT Assessment - 09/16/17 0001      Assessment   Medical Diagnosis  L knee medial and lateral menisectomy and chondroplasty    Onset Date/Surgical Date  05/13/17    Next MD Visit  09/04/17    Prior Therapy  none      Restrictions   Weight Bearing Restrictions  No                   [facility] Adult PT Treatment/Exercise - 09/16/17 0001      Knee/Hip Exercises: Aerobic   Recumbent Bike  L6 x50mn LE only      Knee/Hip Exercises: Machines for Strengthening   Cybex Knee Extension  20# 3x10 reps    Cybex Knee Flexion  50# 3x10 reps    Cybex Leg Press  2 pl, seat 7, 3x10 reps for eccentric strengthening      Knee/Hip Exercises: Standing   Step Down  Left;3 sets;10 reps;Step Height: 4" Heel dot      Modalities   Modalities  EPsychologist, educationalLocation  L knee    Electrical Stimulation Action  Pre-Mod    Electrical Stimulation Parameters  80-150 hz x15 min    Electrical Stimulation Goals  Pain      Vasopneumatic   Number Minutes Vasopneumatic   15 minutes    Vasopnuematic Location   Knee    Vasopneumatic Pressure  Medium    Vasopneumatic Temperature  34               PT Short Term Goals - 05/26/17 1153      PT SHORT TERM GOAL #1   Title  STG=LTG        PT Long Term Goals - 09/16/17 0904      PT LONG TERM GOAL #1   Title  Patient will improve L knee flexion strength to 5/5 to better perform functional activities.    Time  6    Period  Weeks    Status  Achieved      PT LONG TERM GOAL #2   Title  Patient will improve L SLS to greater than or equal to 10 seconds.    Baseline  9 seconds    Time  6    Period  Weeks    Status  Achieved      PT LONG TERM GOAL #3   Title  Patient will self report ability to walk community distances with less than or equal to 1/10 L knee pain in order to perform work duties.    Baseline  4/10 after grocery shopping    Time  6    Period  Weeks    Status  Achieved 1-2/10 08/31/17      PT LONG TERM GOAL #4   Title  Patient will be able to tolerate kneeling and quadruped position for greater than 30 minutes with less than or equal to 2/10 L knee pain in order to perform crawling for work activities.    Time  6    Period  Weeks    Status  Not Met 10-140mn 2/10 pain      PT LONG TERM GOAL #5   Title   Patient will be able to stand with equal LE weight distribution for greater than 1 hour with less than or equal to 1/10 pain to perform household activities.     Time  6    Period  Weeks    Status  Not Met      PT LONG TERM GOAL #6   Title  Patient will self report no pain with ascending and descending a ladder with reciprocating gait pattern in order to perform work activities.     Time  6    Period  Weeks    Status  Achieved            Plan - 09/16/17 0905    Clinical Impression Statement  Patient tolerated today's treatment well with low level L knee pain reported upon arrival. Patient able to be guided through knee strengthening exercises without complaint of fatigue or pain. Patient able to achieve gait pain for community distance goal but still lacking with prolonged kneeling/quadruped as well as equal weight distrbution during gait goal. Normal modalities response noted following removal of the modalities.    Rehab Potential  Excellent    Clinical Impairments Affecting Rehab Potential  COPD, HTN    PT Frequency  2x / week    PT Duration  4 weeks    PT Treatment/Interventions  ADLs/Self Care Home Management;Electrical Stimulation;Iontophoresis 454mml Dexamethasone;Moist Heat;Cryotherapy;Ultrasound;Therapeutic exercise;Therapeutic activities;Functional mobility training;Stair training;Gait training;Balance training;Neuromuscular re-education;Patient/family education;Manual techniques;Passive range of motion;Dry needling;Vasopneumatic Device    PT Next Visit Plan  cont with Left LE strengthening per MD, re-cert sent awaiting signature    PT Home Exercise Plan  standing hip ABD, SLS    Consulted and Agree with Plan of Care  Patient  Patient will benefit from skilled therapeutic intervention in order to improve the following deficits and impairments:  Abnormal gait, Decreased balance, Difficulty walking, Decreased strength, Pain  Visit Diagnosis: Left knee pain, unspecified  chronicity  Difficulty in walking, not elsewhere classified     Problem List Patient Active Problem List   Diagnosis Date Noted  . Medial meniscus tear 05/05/2017    Standley Brooking, PTA 09/16/2017, 9:12 AM  [facility] [address] [place], [place], [address] Phone: [phone]   Fax:  [phone]  Name: RAIDER VALBUENA MRN: [id number] Date of Birth: Dec 06, 1955

## 2017-09-18 ENCOUNTER — Ambulatory Visit: Payer: 59 | Admitting: Physical Therapy

## 2017-09-18 DIAGNOSIS — M25562 Pain in left knee: Secondary | ICD-10-CM | POA: Diagnosis not present

## 2017-09-18 DIAGNOSIS — R262 Difficulty in walking, not elsewhere classified: Secondary | ICD-10-CM

## 2017-09-18 NOTE — Therapy (Signed)
[facility] [place], [place], [address] Phone: [phone]   Fax:  [phone]  Physical Therapy Treatment  Patient Details  Name: CALYB MCQUARRIE MRN: [id number] Date of Birth: 1955/06/07 Referring Provider: Noemi Chapel   Encounter Date: 09/18/2017  PT End of Session - 09/18/17 0818    Visit Number  35    Number of Visits  42    Date for PT Re-Evaluation  10/06/17    Authorization Type  FOTO every 5th visit    PT Start Time  0816    PT Stop Time  0909    PT Time Calculation (min)  53 min    Activity Tolerance  Patient tolerated treatment well    Behavior During Therapy  [facility] for tasks assessed/performed       Past Medical History:  Diagnosis Date  . Guillain Barr syndrome ([place])   . Hypertension     Past Surgical History:  Procedure Laterality Date  . CHOLECYSTECTOMY  1999  . TONSILLECTOMY  1969    There were no vitals filed for this visit.  Subjective Assessment - 09/18/17 0819    Subjective  Patient states he has times with no pain in his knee now.    Patient Stated Goals  walk long distances, crawl, go up and down ladders, get back to where I was before    Currently in Pain?  Yes    Pain Score  1     Pain Location  Knee    Pain Orientation  Left    Pain Descriptors / Indicators  Discomfort                       [facility] Adult PT Treatment/Exercise - 09/18/17 0001      Knee/Hip Exercises: Aerobic   Recumbent Bike  L6 x69mn LE only      Knee/Hip Exercises: Machines for Strengthening   Cybex Knee Extension  20# 3x10 reps    Cybex Knee Flexion  50# 3x10 reps    Cybex Leg Press  2 pl, seat 7, 3x10 reps for eccentric strengthening      Knee/Hip Exercises: Standing   Step Down  Left;3 sets;10 reps;Step Height: 4" Heel dot    Other Standing Knee Exercises  reverse step up on 2 inch step x10,, then x 10 4 inch step    Other Standing Knee Exercises  squats on decline slope x 20      Modalities    Modalities  Electrical Stimulation;Vasopneumatic      Electrical Stimulation   Electrical Stimulation Location  L knee    Electrical Stimulation Action  premod    Electrical Stimulation Parameters  80-150 Hz x 15 min    Electrical Stimulation Goals  Pain      Vasopneumatic   Number Minutes Vasopneumatic   15 minutes    Vasopnuematic Location   Knee    Vasopneumatic Pressure  Medium    Vasopneumatic Temperature   34               PT Short Term Goals - 05/26/17 1153      PT SHORT TERM GOAL #1   Title  STG=LTG        PT Long Term Goals - 09/16/17 0904      PT LONG TERM GOAL #1   Title  Patient will improve L knee flexion strength to 5/5 to better perform functional activities.    Time  6  Period  Weeks    Status  Achieved      PT LONG TERM GOAL #2   Title  Patient will improve L SLS to greater than or equal to 10 seconds.    Baseline  9 seconds    Time  6    Period  Weeks    Status  Achieved      PT LONG TERM GOAL #3   Title  Patient will self report ability to walk community distances with less than or equal to 1/10 L knee pain in order to perform work duties.    Baseline  4/10 after grocery shopping    Time  6    Period  Weeks    Status  Achieved 1-2/10 08/31/17      PT LONG TERM GOAL #4   Title  Patient will be able to tolerate kneeling and quadruped position for greater than 30 minutes with less than or equal to 2/10 L knee pain in order to perform crawling for work activities.    Time  6    Period  Weeks    Status  Not Met 10-145mn 2/10 pain      PT LONG TERM GOAL #5   Title  Patient will be able to stand with equal LE weight distribution for greater than 1 hour with less than or equal to 1/10 pain to perform household activities.     Time  6    Period  Weeks    Status  Not Met      PT LONG TERM GOAL #6   Title  Patient will self report no pain with ascending and descending a ladder with reciprocating gait pattern in order to perform work  activities.     Time  6    Period  Weeks    Status  Achieved            Plan - 09/18/17 0855    Clinical Impression Statement  Patient continues to do well with strengthening and pain control. Still unable to reverse step up with LLE without RLE assist.    PT Treatment/Interventions  ADLs/Self Care Home Management;Electrical Stimulation;Iontophoresis 452mml Dexamethasone;Moist Heat;Cryotherapy;Ultrasound;Therapeutic exercise;Therapeutic activities;Functional mobility training;Stair training;Gait training;Balance training;Neuromuscular re-education;Patient/family education;Manual techniques;Passive range of motion;Dry needling;Vasopneumatic Device    PT Next Visit Plan  cont with Left LE strengthening per MD, re-cert sent awaiting signature       Patient will benefit from skilled therapeutic intervention in order to improve the following deficits and impairments:  Abnormal gait, Decreased balance, Difficulty walking, Decreased strength, Pain  Visit Diagnosis: Left knee pain, unspecified chronicity  Difficulty in walking, not elsewhere classified     Problem List Patient Active Problem List   Diagnosis Date Noted  . Medial meniscus tear 05/05/2017    JuMadelyn FlavorsT 09/18/2017, 8:57 AM  [facility]: [phone] Fax:  [phone]: MaWREN GALLAGARN: [id number] of Birth: 11/24/27/1957

## 2017-09-21 ENCOUNTER — Encounter: Payer: Self-pay | Admitting: Physical Therapy

## 2017-09-21 ENCOUNTER — Ambulatory Visit: Payer: 59 | Attending: Orthopedic Surgery | Admitting: Physical Therapy

## 2017-09-21 DIAGNOSIS — M25562 Pain in left knee: Secondary | ICD-10-CM | POA: Diagnosis not present

## 2017-09-21 DIAGNOSIS — R262 Difficulty in walking, not elsewhere classified: Secondary | ICD-10-CM

## 2017-09-21 NOTE — Therapy (Signed)
[facility] [place], [place], [address] Phone: [phone]   Fax:  [phone]  Physical Therapy Treatment  Patient Details  Name: Roberto Lynch MRN: [id number] Date of Birth: 1956-03-13 Referring Provider: Noemi Chapel   Encounter Date: 09/21/2017  PT End of Session - 09/21/17 0852    Visit Number  36    Number of Visits  42    Date for PT Re-Evaluation  10/06/17    Authorization Type  FOTO every 5th visit    PT Start Time  0818    PT Stop Time  0916    PT Time Calculation (min)  58 min    Activity Tolerance  Patient tolerated treatment well    Behavior During Therapy  [facility] for tasks assessed/performed       Past Medical History:  Diagnosis Date  . Guillain Barr syndrome ([place])   . Hypertension     Past Surgical History:  Procedure Laterality Date  . CHOLECYSTECTOMY  1999  . TONSILLECTOMY  1969    There were no vitals filed for this visit.  Subjective Assessment - 09/21/17 0826    Subjective  Patient has some ongoing pain in knee yet not as bad    Pertinent History  COPD, HTN    Limitations  Walking;Standing    How long can you stand comfortably?  30 mins - 1hr    Diagnostic tests  MRI, x-ray    Patient Stated Goals  walk long distances, crawl, go up and down ladders, get back to where I was before    Currently in Pain?  Yes    Pain Score  1     Pain Location  Knee    Pain Orientation  Left    Pain Descriptors / Indicators  Discomfort    Pain Type  Chronic pain    Pain Onset  More than a month ago    Pain Frequency  Intermittent    Aggravating Factors   increased activity    Pain Relieving Factors  at rest                       [facility] Adult PT Treatment/Exercise - 09/21/17 0001      Knee/Hip Exercises: Aerobic   Recumbent Bike  L6 x3mn LE only      Knee/Hip Exercises: Machines for Strengthening   Cybex Knee Extension  20# 3x10 reps    Cybex Knee Flexion  50# 3x10 reps    Cybex Leg  Press  2 pl, seat 7, 3x10 reps for eccentric strengthening      Knee/Hip Exercises: Standing   Step Down  Left;2 sets;10 reps;Step Height: 6"      Electrical Stimulation   Electrical Stimulation Location  Left VMO/Quad    Electrical Stimulation Action  VMS 10/10 x147m with 3# SAQ for activation    Electrical Stimulation Parameters  80-150hz  x1550m   Electrical Stimulation Goals  Pain      Vasopneumatic   Number Minutes Vasopneumatic   15 minutes    Vasopnuematic Location   Knee    Vasopneumatic Pressure  Medium               PT Short Term Goals - 05/26/17 1153      PT SHORT TERM GOAL #1   Title  STG=LTG        PT Long Term Goals - 09/16/17 0904      PT LONG TERM GOAL #1  Title  Patient will improve L knee flexion strength to 5/5 to better perform functional activities.    Time  6    Period  Weeks    Status  Achieved      PT LONG TERM GOAL #2   Title  Patient will improve L SLS to greater than or equal to 10 seconds.    Baseline  9 seconds    Time  6    Period  Weeks    Status  Achieved      PT LONG TERM GOAL #3   Title  Patient will self report ability to walk community distances with less than or equal to 1/10 L knee pain in order to perform work duties.    Baseline  4/10 after grocery shopping    Time  6    Period  Weeks    Status  Achieved 1-2/10 08/31/17      PT LONG TERM GOAL #4   Title  Patient will be able to tolerate kneeling and quadruped position for greater than 30 minutes with less than or equal to 2/10 L knee pain in order to perform crawling for work activities.    Time  6    Period  Weeks    Status  Not Met 10-159mn 2/10 pain      PT LONG TERM GOAL #5   Title  Patient will be able to stand with equal LE weight distribution for greater than 1 hour with less than or equal to 1/10 pain to perform household activities.     Time  6    Period  Weeks    Status  Not Met      PT LONG TERM GOAL #6   Title  Patient will self report no pain  with ascending and descending a ladder with reciprocating gait pattern in order to perform work activities.     Time  6    Period  Weeks    Status  Achieved            Plan - 09/21/17 0854    Clinical Impression Statement  Patient tolerated treatment well today. Patient continues to have strength deficts with eccentric contol and unable to perform step downs without assistants. patient progressing with VMS on VMO for activation. Goals progressing.     Rehab Potential  Excellent    Clinical Impairments Affecting Rehab Potential  COPD, HTN    PT Frequency  2x / week    PT Duration  4 weeks    PT Treatment/Interventions  ADLs/Self Care Home Management;Electrical Stimulation;Iontophoresis 439mml Dexamethasone;Moist Heat;Cryotherapy;Ultrasound;Therapeutic exercise;Therapeutic activities;Functional mobility training;Stair training;Gait training;Balance training;Neuromuscular re-education;Patient/family education;Manual techniques;Passive range of motion;Dry needling;Vasopneumatic Device    PT Next Visit Plan  cont with Left LE strengthening per MD    Consulted and Agree with Plan of Care  Patient       Patient will benefit from skilled therapeutic intervention in order to improve the following deficits and impairments:  Abnormal gait, Decreased balance, Difficulty walking, Decreased strength, Pain  Visit Diagnosis: Left knee pain, unspecified chronicity  Difficulty in walking, not elsewhere classified     Problem List Patient Active Problem List   Diagnosis Date Noted  . Medial meniscus tear 05/05/2017    Alwin Lanigan P, PTA 09/21/2017, 9:24 AM  [facility]hone: [phone] Fax:  [phone]: Roberto BARRETTARN: [id number] of Birth: 8/August 11, 1955

## 2017-09-23 ENCOUNTER — Encounter: Payer: Self-pay | Admitting: Physical Therapy

## 2017-09-23 ENCOUNTER — Ambulatory Visit: Payer: 59 | Admitting: Physical Therapy

## 2017-09-23 DIAGNOSIS — M25562 Pain in left knee: Secondary | ICD-10-CM | POA: Diagnosis not present

## 2017-09-23 DIAGNOSIS — R262 Difficulty in walking, not elsewhere classified: Secondary | ICD-10-CM

## 2017-09-23 NOTE — Therapy (Signed)
[facility] [place], [place], [address] Phone: [phone]   Fax:  [phone]  Physical Therapy Treatment  Patient Details  Name: Roberto Lynch MRN: [id number] Date of Birth: 1955-12-02 Referring Provider: Noemi Chapel   Encounter Date: 09/23/2017  PT End of Session - 09/23/17 0832    Visit Number  37    Number of Visits  42    Date for PT Re-Evaluation  10/06/17    Authorization Type  FOTO every 5th visit    PT Start Time  0816    PT Stop Time  0912    PT Time Calculation (min)  56 min    Activity Tolerance  Patient tolerated treatment well    Behavior During Therapy  [facility] for tasks assessed/performed       Past Medical History:  Diagnosis Date  . Guillain Barr syndrome ([place])   . Hypertension     Past Surgical History:  Procedure Laterality Date  . CHOLECYSTECTOMY  1999  . TONSILLECTOMY  1969    There were no vitals filed for this visit.  Subjective Assessment - 09/23/17 0825    Subjective  Patient reported ongoing discomfort that has not changed in past week    Pertinent History  COPD, HTN    Limitations  Walking;Standing    How long can you stand comfortably?  30 mins - 1hr    Diagnostic tests  MRI, x-ray    Patient Stated Goals  walk long distances, crawl, go up and down ladders, get back to where I was before    Currently in Pain?  Yes    Pain Score  1     Pain Location  Knee    Pain Orientation  Left    Pain Descriptors / Indicators  Discomfort    Pain Type  Chronic pain    Pain Onset  More than a month ago    Pain Frequency  Intermittent    Aggravating Factors   increased activity    Pain Relieving Factors  at rest                       [facility] Adult PT Treatment/Exercise - 09/23/17 0001      Knee/Hip Exercises: Aerobic   Recumbent Bike  L6 x95mn LE only      Knee/Hip Exercises: Machines for Strengthening   Cybex Knee Extension  20# 3x10 reps eccentric focus    Cybex Knee  Flexion  50# 3x10 reps eccentric focus      Knee/Hip Exercises: Prone   Other Prone Exercises  QP for alt UE lifting 2x10      Electrical Stimulation   Electrical Stimulation Location  Left VMO/Quad    Electrical Stimulation Action  VMS 10/10 x145m with 3# SAQ    Electrical Stimulation Goals  Strength;Neuromuscular facilitation      Vasopneumatic   Number Minutes Vasopneumatic   15 minutes    Vasopnuematic Location   Knee    Vasopneumatic Pressure  Medium               PT Short Term Goals - 05/26/17 1153      PT SHORT TERM GOAL #1   Title  STG=LTG        PT Long Term Goals - 09/23/17 0849      PT LONG TERM GOAL #1   Title  Patient will improve L knee flexion strength to 5/5 to better perform functional activities.  Time  6    Period  Weeks    Status  Achieved      PT LONG TERM GOAL #2   Title  Patient will improve L SLS to greater than or equal to 10 seconds.    Baseline  9 seconds    Time  6    Period  Weeks    Status  Achieved      PT LONG TERM GOAL #3   Title  Patient will self report ability to walk community distances with less than or equal to 1/10 L knee pain in order to perform work duties.    Baseline  4/10 after grocery shopping    Time  6    Period  Weeks    Status  Achieved      PT LONG TERM GOAL #4   Title  Patient will be able to tolerate kneeling and quadruped position for greater than 30 minutes with less than or equal to 2/10 L knee pain in order to perform crawling for work activities.    Baseline  4/10 with quadriped position    Time  6    Period  Weeks    Status  Achieved 1-2/10with 30 min 09/23/17      PT LONG TERM GOAL #5   Title  Patient will be able to stand with equal LE weight distribution for greater than 1 hour with less than or equal to 1/10 pain to perform household activities.     Time  6    Period  Weeks    Status  Not Met            Plan - 09/23/17 0850    Clinical Impression Statement  Patient tolerated  treatment well today. Patient able to progress with QP exercises today with no increased discomfort. Patient able to perform work on knees for 30 min at a time with 1-2/10 pain. Patient has ongoing discomfort and weakness with step downs at this time. Patient feels 75% better overall. Met LTG #4 today.    Rehab Potential  Excellent    Clinical Impairments Affecting Rehab Potential  COPD, HTN    PT Frequency  2x / week    PT Duration  4 weeks    PT Treatment/Interventions  ADLs/Self Care Home Management;Electrical Stimulation;Iontophoresis 37m/ml Dexamethasone;Moist Heat;Cryotherapy;Ultrasound;Therapeutic exercise;Therapeutic activities;Functional mobility training;Stair training;Gait training;Balance training;Neuromuscular re-education;Patient/family education;Manual techniques;Passive range of motion;Dry needling;Vasopneumatic Device    PT Next Visit Plan  cont with Left LE strengthening per MD cont with 2" step downs to work on [employer] Agree with Plan of Care  Patient       Patient will benefit from skilled therapeutic intervention in order to improve the following deficits and impairments:  Abnormal gait, Decreased balance, Difficulty walking, Decreased strength, Pain  Visit Diagnosis: Left knee pain, unspecified chronicity  Difficulty in walking, not elsewhere classified     Problem List Patient Active Problem List   Diagnosis Date Noted  . Medial meniscus tear 05/05/2017    Sahvannah Rieser P, PTA 09/23/2017, 9:25 AM  [facility] [address]Phone: [phone]  Fax:  [phone] Name: Roberto MALEKMRN: [id number] of Birth: 809-28-1957

## 2017-09-25 ENCOUNTER — Encounter: Payer: Self-pay | Admitting: Physical Therapy

## 2017-09-25 ENCOUNTER — Ambulatory Visit: Payer: 59 | Admitting: Physical Therapy

## 2017-09-25 DIAGNOSIS — M25562 Pain in left knee: Secondary | ICD-10-CM

## 2017-09-25 DIAGNOSIS — R262 Difficulty in walking, not elsewhere classified: Secondary | ICD-10-CM

## 2017-09-25 NOTE — Therapy (Signed)
[facility] [place], [place], [address] Phone: [phone]   Fax:  [phone]  Physical Therapy Treatment  Patient Details  Name: Roberto Lynch MRN: [id number] Date of Birth: [birth year] Referring Provider: Noemi Chapel   Encounter Date: 09/25/2017  PT End of Session - 09/25/17 0817    Visit Number  38    Number of Visits  42    Date for PT Re-Evaluation  10/06/17    Authorization Type  FOTO every 5th visit    PT Start Time  0817    PT Stop Time  0903    PT Time Calculation (min)  46 min    Activity Tolerance  Patient tolerated treatment well    Behavior During Therapy  [facility] for tasks assessed/performed       Past Medical History:  Diagnosis Date  . Guillain Barr syndrome ([place])   . Hypertension     Past Surgical History:  Procedure Laterality Date  . CHOLECYSTECTOMY  1999  . TONSILLECTOMY  1969    There were no vitals filed for this visit.  Subjective Assessment - 09/25/17 0816    Subjective  Goes back to MD on Tuesday.    Pertinent History  COPD, HTN    Limitations  Walking;Standing    How long can you stand comfortably?  30 mins - 1hr    Diagnostic tests  MRI, x-ray    Patient Stated Goals  walk long distances, crawl, go up and down ladders, get back to where I was before    Currently in Pain?  No/denies         [facility] PT Assessment - 09/25/17 0001      Assessment   Medical Diagnosis  L knee medial and lateral menisectomy and chondroplasty    Onset Date/Surgical Date  05/13/17    Next MD Visit  09/29/2017    Prior Therapy  none      Restrictions   Weight Bearing Restrictions  No                   [facility] Adult PT Treatment/Exercise - 09/25/17 0001      Knee/Hip Exercises: Aerobic   Recumbent Bike  L6 x17mn LE only      Knee/Hip Exercises: Machines for Strengthening   Cybex Knee Extension  20# 3x10 reps eccentric strengthening    Cybex Knee Flexion  50# 3x10 reps eccentric strengthening     Cybex Leg Press  2 pl, seat 7, 3x10 reps with ball squeeze      Knee/Hip Exercises: Standing   Other Standing Knee Exercises  L heel dot 4" step 3x10 reps      Knee/Hip Exercises: Supine   Single Leg Bridge  Strengthening;Left;2 sets;10 reps    Straight Leg Raises  Strengthening;Left;3 sets;10 reps;Limitations    Straight Leg Raises Limitations  eccentric strengthening focus      Modalities   Modalities  Electrical Stimulation;Vasopneumatic      Electrical Stimulation   Electrical Stimulation Location  L knee    Electrical Stimulation Action  Pre-Mod    Electrical Stimulation Parameters  80-150 hz x10 min    Electrical Stimulation Goals  Edema      Vasopneumatic   Number Minutes Vasopneumatic   10 minutes    Vasopnuematic Location   Knee    Vasopneumatic Pressure  Low    Vasopneumatic Temperature   34               PT  Short Term Goals - 05/26/17 1153      PT SHORT TERM GOAL #1   Title  STG=LTG        PT Long Term Goals - 09/23/17 0849      PT LONG TERM GOAL #1   Title  Patient will improve L knee flexion strength to 5/5 to better perform functional activities.    Time  6    Period  Weeks    Status  Achieved      PT LONG TERM GOAL #2   Title  Patient will improve L SLS to greater than or equal to 10 seconds.    Baseline  9 seconds    Time  6    Period  Weeks    Status  Achieved      PT LONG TERM GOAL #3   Title  Patient will self report ability to walk community distances with less than or equal to 1/10 L knee pain in order to perform work duties.    Baseline  4/10 after grocery shopping    Time  6    Period  Weeks    Status  Achieved      PT LONG TERM GOAL #4   Title  Patient will be able to tolerate kneeling and quadruped position for greater than 30 minutes with less than or equal to 2/10 L knee pain in order to perform crawling for work activities.    Baseline  4/10 with quadriped position    Time  6    Period  Weeks    Status  Achieved  1-2/10with 30 min 09/23/17      PT LONG TERM GOAL #5   Title  Patient will be able to stand with equal LE weight distribution for greater than 1 hour with less than or equal to 1/10 pain to perform household activities.     Time  6    Period  Weeks    Status  Not Met            Plan - 09/25/17 0856    Clinical Impression Statement  Patient tolerated today's treatment well although still limited with eccentric strengthening of LLE. Minimal weakness still noted with all eccentric focused exercises. Patient still limited with prolonged standing due to discomfort. All exercises instructed with eccentric focus and slow and control speed. Normal modalities response noted following removal of the modalities.    Rehab Potential  Excellent    Clinical Impairments Affecting Rehab Potential  COPD, HTN    PT Frequency  2x / week    PT Duration  4 weeks    PT Treatment/Interventions  ADLs/Self Care Home Management;Electrical Stimulation;Iontophoresis 35m/ml Dexamethasone;Moist Heat;Cryotherapy;Ultrasound;Therapeutic exercise;Therapeutic activities;Functional mobility training;Stair training;Gait training;Balance training;Neuromuscular re-education;Patient/family education;Manual techniques;Passive range of motion;Dry needling;Vasopneumatic Device    PT Next Visit Plan  cont with Left LE strengthening per MD cont with 2" step downs to work on [place] standing hip ABD, SLS    Consulted and Agree with Plan of Care  Patient       Patient will benefit from skilled therapeutic intervention in order to improve the following deficits and impairments:  Abnormal gait, Decreased balance, Difficulty walking, Decreased strength, Pain  Visit Diagnosis: Left knee pain, unspecified chronicity  Difficulty in walking, not elsewhere classified     Problem List Patient Active Problem List   Diagnosis Date Noted  . Medial meniscus tear 05/05/2017    KStandley Brooking  PTA 09/25/2017, 9:44 AM  [facility] [place], [place], [address] Phone: [phone]   Fax:  [phone]  Name: CARLY APPLEGATE MRN: [id number] Date of Birth: Oct 13, 1955

## 2017-09-28 ENCOUNTER — Encounter: Payer: Self-pay | Admitting: Physical Therapy

## 2017-09-28 ENCOUNTER — Ambulatory Visit: Payer: 59 | Admitting: Physical Therapy

## 2017-09-28 DIAGNOSIS — R262 Difficulty in walking, not elsewhere classified: Secondary | ICD-10-CM

## 2017-09-28 DIAGNOSIS — M25562 Pain in left knee: Secondary | ICD-10-CM | POA: Diagnosis not present

## 2017-09-28 NOTE — Therapy (Addendum)
[facility] [place], [place], [address] Phone: [phone]   Fax:  [phone]  Physical Therapy Treatment/Discharge  Patient Details  Name: Roberto Lynch MRN: [id number] Date of Birth: Feb 14, 1956 Referring Provider: Noemi Chapel   Encounter Date: 09/28/2017  PT End of Session - 09/28/17 0825    Visit Number  39    Number of Visits  42    Date for PT Re-Evaluation  10/06/17    Authorization Type  FOTO every 5th visit    PT Start Time  0816    PT Stop Time  0914    PT Time Calculation (min)  58 min    Activity Tolerance  Patient tolerated treatment well    Behavior During Therapy  [facility] for tasks assessed/performed       Past Medical History:  Diagnosis Date  . Guillain Barr syndrome ([place])   . Hypertension     Past Surgical History:  Procedure Laterality Date  . CHOLECYSTECTOMY  1999  . TONSILLECTOMY  1969    There were no vitals filed for this visit.  Subjective Assessment - 09/28/17 0817    Subjective  Goes back to MD on tomorrow.    Pertinent History  COPD, HTN    Limitations  Walking;Standing    How long can you stand comfortably?  30 mins - 1hr    Diagnostic tests  MRI, x-ray    Patient Stated Goals  walk long distances, crawl, go up and down ladders, get back to where I was before    Currently in Pain?  Yes    Pain Score  1     Pain Location  Knee    Pain Orientation  Left    Pain Descriptors / Indicators  Discomfort    Pain Type  Chronic pain    Pain Onset  More than a month ago    Pain Frequency  Intermittent         [facility] PT Assessment - 09/28/17 0001      Assessment   Medical Diagnosis  L knee medial and lateral menisectomy and chondroplasty    Onset Date/Surgical Date  05/13/17    Next MD Visit  09/29/2017    Prior Therapy  none      Restrictions   Weight Bearing Restrictions  No                   [facility] Adult PT Treatment/Exercise - 09/28/17 0001      Knee/Hip Exercises:  Aerobic   Recumbent Bike  L6 x70mn LE only      Knee/Hip Exercises: Machines for Strengthening   Cybex Knee Extension  20# 3x10 reps eccentric focus    Cybex Knee Flexion  50# 3x10 reps eccentric focus    Cybex Leg Press  2 pl, seat 7, 3x10 reps with ball squeeze      Knee/Hip Exercises: Standing   Wall Squat  1 set;10 reps;3 seconds    SLS  LLE SLS to lower table for cone touch 2x10 reps Greater difficulty with LLE SLS and RUE reach    Other Standing Knee Exercises  L heel dot 4" step 3x10 reps      Modalities   Modalities  Electrical Stimulation;Vasopneumatic      Electrical Stimulation   Electrical Stimulation Location  L knee    Electrical Stimulation Action  Pre-Mod    Electrical Stimulation Parameters  80-150 hz x15 min    Electrical Stimulation Goals  Edema  Vasopneumatic   Number Minutes Vasopneumatic   15 minutes    Vasopnuematic Location   Knee    Vasopneumatic Pressure  Low    Vasopneumatic Temperature   34               PT Short Term Goals - 05/26/17 1153      PT SHORT TERM GOAL #1   Title  STG=LTG        PT Long Term Goals - 09/28/17 0826      PT LONG TERM GOAL #1   Title  Patient will improve L knee flexion strength to 5/5 to better perform functional activities.    Time  6    Period  Weeks    Status  Achieved      PT LONG TERM GOAL #2   Title  Patient will improve L SLS to greater than or equal to 10 seconds.    Baseline  9 seconds    Time  6    Period  Weeks    Status  Achieved      PT LONG TERM GOAL #3   Title  Patient will self report ability to walk community distances with less than or equal to 1/10 L knee pain in order to perform work duties.    Baseline  4/10 after grocery shopping    Time  6    Period  Weeks    Status  Achieved      PT LONG TERM GOAL #4   Title  Patient will be able to tolerate kneeling and quadruped position for greater than 30 minutes with less than or equal to 2/10 L knee pain in order to perform  crawling for work activities.    Baseline  4/10 with quadriped position    Time  6    Period  Weeks    Status  Achieved 1-2/10with 30 min 09/23/17      PT LONG TERM GOAL #5   Title  Patient will be able to stand with equal LE weight distribution for greater than 1 hour with less than or equal to 1/10 pain to perform household activities.     Time  6    Period  Weeks    Status  Not Met      PT LONG TERM GOAL #6   Title  Patient will self report no pain with ascending and descending a ladder with reciprocating gait pattern in order to perform work activities.     Baseline  Patient able to do without pain    Time  6    Period  Weeks    Status  Achieved            Plan - 09/28/17 0919    Clinical Impression Statement  Patient tolerated today's treatment well although he reported greater discomfort with wall squats today. Patient able to be taken through all exercises without difficulty. Decreased LLE stability with LLE SLS for reaching exercises. Normal modalities response noted following removal of the modalities. Able to achieve all goals set at evaluation except for standing for 1 hr goal.    Rehab Potential  Excellent    Clinical Impairments Affecting Rehab Potential  COPD, HTN    PT Frequency  2x / week    PT Duration  4 weeks    PT Treatment/Interventions  ADLs/Self Care Home Management;Electrical Stimulation;Iontophoresis 23m/ml Dexamethasone;Moist Heat;Cryotherapy;Ultrasound;Therapeutic exercise;Therapeutic activities;Functional mobility training;Stair training;Gait training;Balance training;Neuromuscular re-education;Patient/family education;Manual techniques;Passive range of motion;Dry needling;Vasopneumatic Device  PT Next Visit Plan  cont with Left LE strengthening per MD cont with 2" step downs to work on eccentrics    PT Home Exercise Plan  standing hip ABD, SLS    Consulted and Agree with Plan of Care  Patient       Patient will benefit from skilled therapeutic  intervention in order to improve the following deficits and impairments:  Abnormal gait, Decreased balance, Difficulty walking, Decreased strength, Pain  Visit Diagnosis: Left knee pain, unspecified chronicity  Difficulty in walking, not elsewhere classified     Problem List Patient Active Problem List   Diagnosis Date Noted  . Medial meniscus tear 05/05/2017   PHYSICAL THERAPY DISCHARGE SUMMARY  Visits from Start of Care: 39  Current functional level related to goals / functional outcomes: See above   Remaining deficits: See goals   Education / Equipment: HEP Plan: Patient agrees to discharge.  Patient goals were partially met. Patient is being discharged due to lack of progress.  ?????      Standley Brooking, [place] 09/28/17 9:29 AM   [facility] [address] [place], [place], [address] Phone: [phone]   Fax:  [phone]  Name: Roberto Lynch MRN: [id number] Date of Birth: 1955/08/01

## 2019-12-21 DIAGNOSIS — I480 Paroxysmal atrial fibrillation: Secondary | ICD-10-CM

## 2019-12-21 HISTORY — DX: Paroxysmal atrial fibrillation: I48.0

## 2019-12-27 ENCOUNTER — Emergency Department (HOSPITAL_COMMUNITY): Payer: 59

## 2019-12-27 ENCOUNTER — Inpatient Hospital Stay (HOSPITAL_COMMUNITY): Payer: 59

## 2019-12-27 ENCOUNTER — Inpatient Hospital Stay (HOSPITAL_COMMUNITY)
Admission: EM | Admit: 2019-12-27 | Discharge: 2020-02-08 | DRG: 003 | Disposition: A | Discharge status: Rehabilitation Facility | Payer: 59 | Attending: Internal Medicine | Admitting: Internal Medicine

## 2019-12-27 ENCOUNTER — Encounter (HOSPITAL_COMMUNITY): Admission: EM | Disposition: A | Discharge status: Rehabilitation Facility | Payer: Self-pay | Source: Home / Self Care

## 2019-12-27 ENCOUNTER — Encounter (HOSPITAL_COMMUNITY): Payer: Self-pay

## 2019-12-27 ENCOUNTER — Inpatient Hospital Stay (HOSPITAL_COMMUNITY): Payer: 59 | Admitting: Certified Registered Nurse Anesthetist

## 2019-12-27 DIAGNOSIS — F05 Delirium due to known physiological condition: Secondary | ICD-10-CM | POA: Diagnosis not present

## 2019-12-27 DIAGNOSIS — E785 Hyperlipidemia, unspecified: Secondary | ICD-10-CM | POA: Diagnosis present

## 2019-12-27 DIAGNOSIS — J690 Pneumonitis due to inhalation of food and vomit: Secondary | ICD-10-CM | POA: Diagnosis not present

## 2019-12-27 DIAGNOSIS — Z86718 Personal history of other venous thrombosis and embolism: Secondary | ICD-10-CM

## 2019-12-27 DIAGNOSIS — Z91030 Bee allergy status: Secondary | ICD-10-CM

## 2019-12-27 DIAGNOSIS — I672 Cerebral atherosclerosis: Secondary | ICD-10-CM | POA: Diagnosis present

## 2019-12-27 DIAGNOSIS — N179 Acute kidney failure, unspecified: Secondary | ICD-10-CM | POA: Diagnosis not present

## 2019-12-27 DIAGNOSIS — G9341 Metabolic encephalopathy: Secondary | ICD-10-CM | POA: Diagnosis not present

## 2019-12-27 DIAGNOSIS — M9080 Osteopathy in diseases classified elsewhere, unspecified site: Secondary | ICD-10-CM | POA: Diagnosis not present

## 2019-12-27 DIAGNOSIS — S0181XA Laceration without foreign body of other part of head, initial encounter: Secondary | ICD-10-CM

## 2019-12-27 DIAGNOSIS — E876 Hypokalemia: Secondary | ICD-10-CM | POA: Diagnosis present

## 2019-12-27 DIAGNOSIS — E8770 Fluid overload, unspecified: Secondary | ICD-10-CM | POA: Diagnosis present

## 2019-12-27 DIAGNOSIS — S92101A Unspecified fracture of right talus, initial encounter for closed fracture: Secondary | ICD-10-CM

## 2019-12-27 DIAGNOSIS — J9811 Atelectasis: Secondary | ICD-10-CM | POA: Diagnosis not present

## 2019-12-27 DIAGNOSIS — R0600 Dyspnea, unspecified: Secondary | ICD-10-CM | POA: Diagnosis not present

## 2019-12-27 DIAGNOSIS — J9622 Acute and chronic respiratory failure with hypercapnia: Secondary | ICD-10-CM | POA: Diagnosis not present

## 2019-12-27 DIAGNOSIS — Z6841 Body Mass Index (BMI) 40.0 and over, adult: Secondary | ICD-10-CM

## 2019-12-27 DIAGNOSIS — J9809 Other diseases of bronchus, not elsewhere classified: Secondary | ICD-10-CM | POA: Diagnosis not present

## 2019-12-27 DIAGNOSIS — G610 Guillain-Barre syndrome: Secondary | ICD-10-CM | POA: Diagnosis present

## 2019-12-27 DIAGNOSIS — R0689 Other abnormalities of breathing: Secondary | ICD-10-CM | POA: Diagnosis not present

## 2019-12-27 DIAGNOSIS — S0101XA Laceration without foreign body of scalp, initial encounter: Secondary | ICD-10-CM | POA: Diagnosis present

## 2019-12-27 DIAGNOSIS — K219 Gastro-esophageal reflux disease without esophagitis: Secondary | ICD-10-CM | POA: Diagnosis present

## 2019-12-27 DIAGNOSIS — E559 Vitamin D deficiency, unspecified: Secondary | ICD-10-CM | POA: Diagnosis not present

## 2019-12-27 DIAGNOSIS — G5793 Unspecified mononeuropathy of bilateral lower limbs: Secondary | ICD-10-CM | POA: Diagnosis present

## 2019-12-27 DIAGNOSIS — J9612 Chronic respiratory failure with hypercapnia: Secondary | ICD-10-CM | POA: Diagnosis not present

## 2019-12-27 DIAGNOSIS — Z20822 Contact with and (suspected) exposure to covid-19: Secondary | ICD-10-CM | POA: Diagnosis present

## 2019-12-27 DIAGNOSIS — I480 Paroxysmal atrial fibrillation: Secondary | ICD-10-CM | POA: Diagnosis not present

## 2019-12-27 DIAGNOSIS — S2221XA Fracture of manubrium, initial encounter for closed fracture: Secondary | ICD-10-CM | POA: Diagnosis present

## 2019-12-27 DIAGNOSIS — I10 Essential (primary) hypertension: Secondary | ICD-10-CM | POA: Diagnosis present

## 2019-12-27 DIAGNOSIS — F10239 Alcohol dependence with withdrawal, unspecified: Secondary | ICD-10-CM | POA: Diagnosis not present

## 2019-12-27 DIAGNOSIS — Y92410 Unspecified street and highway as the place of occurrence of the external cause: Secondary | ICD-10-CM | POA: Diagnosis not present

## 2019-12-27 DIAGNOSIS — R1310 Dysphagia, unspecified: Secondary | ICD-10-CM | POA: Diagnosis not present

## 2019-12-27 DIAGNOSIS — S301XXA Contusion of abdominal wall, initial encounter: Secondary | ICD-10-CM

## 2019-12-27 DIAGNOSIS — I4891 Unspecified atrial fibrillation: Secondary | ICD-10-CM | POA: Diagnosis not present

## 2019-12-27 DIAGNOSIS — I700 Atherosclerosis of aorta: Secondary | ICD-10-CM | POA: Diagnosis present

## 2019-12-27 DIAGNOSIS — N200 Calculus of kidney: Secondary | ICD-10-CM

## 2019-12-27 DIAGNOSIS — S92191A Other fracture of right talus, initial encounter for closed fracture: Secondary | ICD-10-CM | POA: Diagnosis present

## 2019-12-27 DIAGNOSIS — Z720 Tobacco use: Secondary | ICD-10-CM | POA: Diagnosis not present

## 2019-12-27 DIAGNOSIS — Z930 Tracheostomy status: Secondary | ICD-10-CM | POA: Diagnosis not present

## 2019-12-27 DIAGNOSIS — S92111A Displaced fracture of neck of right talus, initial encounter for closed fracture: Principal | ICD-10-CM | POA: Diagnosis present

## 2019-12-27 DIAGNOSIS — I2510 Atherosclerotic heart disease of native coronary artery without angina pectoris: Secondary | ICD-10-CM | POA: Diagnosis present

## 2019-12-27 DIAGNOSIS — Z95828 Presence of other vascular implants and grafts: Secondary | ICD-10-CM

## 2019-12-27 DIAGNOSIS — J9621 Acute and chronic respiratory failure with hypoxia: Secondary | ICD-10-CM | POA: Diagnosis not present

## 2019-12-27 DIAGNOSIS — S27892A Contusion of other specified intrathoracic organs, initial encounter: Secondary | ICD-10-CM

## 2019-12-27 DIAGNOSIS — J398 Other specified diseases of upper respiratory tract: Secondary | ICD-10-CM | POA: Diagnosis present

## 2019-12-27 DIAGNOSIS — I4819 Other persistent atrial fibrillation: Secondary | ICD-10-CM | POA: Diagnosis present

## 2019-12-27 DIAGNOSIS — R0602 Shortness of breath: Secondary | ICD-10-CM | POA: Diagnosis not present

## 2019-12-27 DIAGNOSIS — E870 Hyperosmolality and hypernatremia: Secondary | ICD-10-CM | POA: Diagnosis not present

## 2019-12-27 DIAGNOSIS — Z7951 Long term (current) use of inhaled steroids: Secondary | ICD-10-CM

## 2019-12-27 DIAGNOSIS — E7220 Disorder of urea cycle metabolism, unspecified: Secondary | ICD-10-CM | POA: Diagnosis not present

## 2019-12-27 DIAGNOSIS — J9611 Chronic respiratory failure with hypoxia: Secondary | ICD-10-CM | POA: Diagnosis not present

## 2019-12-27 DIAGNOSIS — S92111D Displaced fracture of neck of right talus, subsequent encounter for fracture with routine healing: Secondary | ICD-10-CM | POA: Diagnosis not present

## 2019-12-27 DIAGNOSIS — T1490XA Injury, unspecified, initial encounter: Secondary | ICD-10-CM

## 2019-12-27 DIAGNOSIS — I77810 Thoracic aortic ectasia: Secondary | ICD-10-CM | POA: Diagnosis present

## 2019-12-27 DIAGNOSIS — R1312 Dysphagia, oropharyngeal phase: Secondary | ICD-10-CM | POA: Diagnosis not present

## 2019-12-27 DIAGNOSIS — E872 Acidosis: Secondary | ICD-10-CM | POA: Diagnosis present

## 2019-12-27 DIAGNOSIS — R739 Hyperglycemia, unspecified: Secondary | ICD-10-CM | POA: Diagnosis present

## 2019-12-27 DIAGNOSIS — Z978 Presence of other specified devices: Secondary | ICD-10-CM | POA: Diagnosis not present

## 2019-12-27 DIAGNOSIS — K7200 Acute and subacute hepatic failure without coma: Secondary | ICD-10-CM | POA: Diagnosis not present

## 2019-12-27 DIAGNOSIS — J440 Chronic obstructive pulmonary disease with acute lower respiratory infection: Secondary | ICD-10-CM | POA: Diagnosis not present

## 2019-12-27 DIAGNOSIS — N132 Hydronephrosis with renal and ureteral calculous obstruction: Secondary | ICD-10-CM | POA: Diagnosis present

## 2019-12-27 DIAGNOSIS — Z23 Encounter for immunization: Secondary | ICD-10-CM | POA: Diagnosis not present

## 2019-12-27 DIAGNOSIS — R001 Bradycardia, unspecified: Secondary | ICD-10-CM | POA: Diagnosis not present

## 2019-12-27 DIAGNOSIS — R410 Disorientation, unspecified: Secondary | ICD-10-CM | POA: Diagnosis not present

## 2019-12-27 DIAGNOSIS — E889 Metabolic disorder, unspecified: Secondary | ICD-10-CM | POA: Diagnosis not present

## 2019-12-27 DIAGNOSIS — Z7982 Long term (current) use of aspirin: Secondary | ICD-10-CM

## 2019-12-27 DIAGNOSIS — E861 Hypovolemia: Secondary | ICD-10-CM | POA: Diagnosis not present

## 2019-12-27 DIAGNOSIS — S92109A Unspecified fracture of unspecified talus, initial encounter for closed fracture: Secondary | ICD-10-CM | POA: Diagnosis present

## 2019-12-27 DIAGNOSIS — G4730 Sleep apnea, unspecified: Secondary | ICD-10-CM | POA: Diagnosis not present

## 2019-12-27 DIAGNOSIS — J9690 Respiratory failure, unspecified, unspecified whether with hypoxia or hypercapnia: Secondary | ICD-10-CM

## 2019-12-27 DIAGNOSIS — J9601 Acute respiratory failure with hypoxia: Secondary | ICD-10-CM | POA: Diagnosis not present

## 2019-12-27 DIAGNOSIS — Z79899 Other long term (current) drug therapy: Secondary | ICD-10-CM

## 2019-12-27 DIAGNOSIS — K7682 Hepatic encephalopathy: Secondary | ICD-10-CM

## 2019-12-27 DIAGNOSIS — R5381 Other malaise: Secondary | ICD-10-CM | POA: Diagnosis not present

## 2019-12-27 DIAGNOSIS — S2231XA Fracture of one rib, right side, initial encounter for closed fracture: Secondary | ICD-10-CM | POA: Diagnosis present

## 2019-12-27 DIAGNOSIS — F17210 Nicotine dependence, cigarettes, uncomplicated: Secondary | ICD-10-CM | POA: Diagnosis present

## 2019-12-27 DIAGNOSIS — Z9049 Acquired absence of other specified parts of digestive tract: Secondary | ICD-10-CM

## 2019-12-27 DIAGNOSIS — K7290 Hepatic failure, unspecified without coma: Secondary | ICD-10-CM | POA: Diagnosis not present

## 2019-12-27 DIAGNOSIS — J449 Chronic obstructive pulmonary disease, unspecified: Secondary | ICD-10-CM | POA: Diagnosis not present

## 2019-12-27 DIAGNOSIS — G4733 Obstructive sleep apnea (adult) (pediatric): Secondary | ICD-10-CM | POA: Diagnosis not present

## 2019-12-27 DIAGNOSIS — T07XXXA Unspecified multiple injuries, initial encounter: Secondary | ICD-10-CM | POA: Diagnosis not present

## 2019-12-27 DIAGNOSIS — E662 Morbid (severe) obesity with alveolar hypoventilation: Secondary | ICD-10-CM | POA: Diagnosis present

## 2019-12-27 DIAGNOSIS — S20212A Contusion of left front wall of thorax, initial encounter: Secondary | ICD-10-CM

## 2019-12-27 DIAGNOSIS — R9431 Abnormal electrocardiogram [ECG] [EKG]: Secondary | ICD-10-CM | POA: Diagnosis not present

## 2019-12-27 DIAGNOSIS — Z781 Physical restraint status: Secondary | ICD-10-CM

## 2019-12-27 DIAGNOSIS — F17200 Nicotine dependence, unspecified, uncomplicated: Secondary | ICD-10-CM | POA: Diagnosis present

## 2019-12-27 DIAGNOSIS — S82831A Other fracture of upper and lower end of right fibula, initial encounter for closed fracture: Secondary | ICD-10-CM | POA: Diagnosis present

## 2019-12-27 DIAGNOSIS — S9304XA Dislocation of right ankle joint, initial encounter: Secondary | ICD-10-CM | POA: Diagnosis present

## 2019-12-27 DIAGNOSIS — S2249XA Multiple fractures of ribs, unspecified side, initial encounter for closed fracture: Secondary | ICD-10-CM

## 2019-12-27 DIAGNOSIS — J9602 Acute respiratory failure with hypercapnia: Secondary | ICD-10-CM | POA: Diagnosis not present

## 2019-12-27 DIAGNOSIS — T148XXA Other injury of unspecified body region, initial encounter: Secondary | ICD-10-CM

## 2019-12-27 DIAGNOSIS — M898X9 Other specified disorders of bone, unspecified site: Secondary | ICD-10-CM

## 2019-12-27 HISTORY — DX: Acute embolism and thrombosis of unspecified deep veins of unspecified lower extremity: I82.409

## 2019-12-27 HISTORY — DX: Unspecified mononeuropathy of unspecified lower limb: G57.90

## 2019-12-27 HISTORY — DX: Alcohol use, unspecified, uncomplicated: F10.90

## 2019-12-27 HISTORY — PX: EXTERNAL FIXATION LEG: SHX1549

## 2019-12-27 HISTORY — DX: Other problems related to lifestyle: Z72.89

## 2019-12-27 LAB — COMPREHENSIVE METABOLIC PANEL
ALT: 84 U/L — ABNORMAL HIGH (ref 0–44)
AST: 146 U/L — ABNORMAL HIGH (ref 15–41)
Albumin: 3.7 g/dL (ref 3.5–5.0)
Alkaline Phosphatase: 78 U/L (ref 38–126)
Anion gap: 9 (ref 5–15)
BUN: 13 mg/dL (ref 8–23)
CO2: 25 mmol/L (ref 22–32)
Calcium: 9.7 mg/dL (ref 8.9–10.3)
Chloride: 108 mmol/L (ref 98–111)
Creatinine, Ser: 0.81 mg/dL (ref 0.61–1.24)
GFR calc Af Amer: >60 mL/min (ref >60)
GFR calc non Af Amer: >60 mL/min (ref >60)
Glucose, Bld: 147 mg/dL — ABNORMAL HIGH (ref 70–99)
Potassium: 4 mmol/L (ref 3.5–5.1)
Sodium: 142 mmol/L (ref 135–145)
Total Bilirubin: 0.9 mg/dL (ref 0.3–1.2)
Total Protein: 6.8 g/dL (ref 6.5–8.1)

## 2019-12-27 LAB — SAMPLE TO BLOOD BANK

## 2019-12-27 LAB — BLOOD GAS, ARTERIAL
Acid-base deficit: 0.3 mmol/L (ref 0.0–2.0)
Bicarbonate: 25.4 mmol/L (ref 20.0–28.0)
Drawn by: 252031
FIO2: 100
O2 Saturation: 98 %
Patient temperature: 37
pCO2 arterial: 53.4 mmHg — ABNORMAL HIGH (ref 32.0–48.0)
pH, Arterial: 7.298 — ABNORMAL LOW (ref 7.350–7.450)
pO2, Arterial: 109 mmHg — ABNORMAL HIGH (ref 83.0–108.0)

## 2019-12-27 LAB — CBC
HCT: 55.2 % — ABNORMAL HIGH (ref 39.0–52.0)
Hemoglobin: 18 g/dL — ABNORMAL HIGH (ref 13.0–17.0)
MCH: 32.6 pg (ref 26.0–34.0)
MCHC: 32.6 g/dL (ref 30.0–36.0)
MCV: 100 fL (ref 80.0–100.0)
Platelets: 161 10*3/uL (ref 150–400)
RBC: 5.52 MIL/uL (ref 4.22–5.81)
RDW: 13.2 % (ref 11.5–15.5)
WBC: 10.7 10*3/uL — ABNORMAL HIGH (ref 4.0–10.5)
nRBC: 0 % (ref 0.0–0.2)

## 2019-12-27 LAB — PROTIME-INR
INR: 1 (ref 0.8–1.2)
Prothrombin Time: 13.2 seconds (ref 11.4–15.2)

## 2019-12-27 LAB — I-STAT CHEM 8, ED
BUN: 17 mg/dL (ref 8–23)
Calcium, Ion: 1.17 mmol/L (ref 1.15–1.40)
Chloride: 106 mmol/L (ref 98–111)
Creatinine, Ser: 0.8 mg/dL (ref 0.61–1.24)
Glucose, Bld: 139 mg/dL — ABNORMAL HIGH (ref 70–99)
HCT: 54 % — ABNORMAL HIGH (ref 39.0–52.0)
Hemoglobin: 18.4 g/dL — ABNORMAL HIGH (ref 13.0–17.0)
Potassium: 4 mmol/L (ref 3.5–5.1)
Sodium: 144 mmol/L (ref 135–145)
TCO2: 25 mmol/L (ref 22–32)

## 2019-12-27 LAB — BASIC METABOLIC PANEL
Anion gap: 9 (ref 5–15)
BUN: 16 mg/dL (ref 8–23)
CO2: 23 mmol/L (ref 22–32)
Calcium: 8.9 mg/dL (ref 8.9–10.3)
Chloride: 110 mmol/L (ref 98–111)
Creatinine, Ser: 0.95 mg/dL (ref 0.61–1.24)
GFR calc Af Amer: >60 mL/min (ref >60)
GFR calc non Af Amer: >60 mL/min (ref >60)
Glucose, Bld: 148 mg/dL — ABNORMAL HIGH (ref 70–99)
Potassium: 5.2 mmol/L — ABNORMAL HIGH (ref 3.5–5.1)
Sodium: 142 mmol/L (ref 135–145)

## 2019-12-27 LAB — LACTIC ACID, PLASMA: Lactic Acid, Venous: 2.3 mmol/L (ref 0.5–1.9)

## 2019-12-27 LAB — SURGICAL PCR SCREEN
MRSA, PCR: NEGATIVE
Staphylococcus aureus: NEGATIVE

## 2019-12-27 LAB — HIV ANTIBODY (ROUTINE TESTING W REFLEX): HIV Screen 4th Generation wRfx: NONREACTIVE

## 2019-12-27 LAB — TYPE AND SCREEN
ABO/RH(D): A NEG
Antibody Screen: NEGATIVE

## 2019-12-27 LAB — SARS CORONAVIRUS 2 BY RT PCR (HOSPITAL ORDER, PERFORMED IN ~~LOC~~ HOSPITAL LAB): SARS Coronavirus 2: NEGATIVE

## 2019-12-27 LAB — MAGNESIUM: Magnesium: 2 mg/dL (ref 1.7–2.4)

## 2019-12-27 LAB — ETHANOL: Alcohol, Ethyl (B): <10 mg/dL (ref <10)

## 2019-12-27 LAB — TRIGLYCERIDES: Triglycerides: 213 mg/dL — ABNORMAL HIGH (ref <150)

## 2019-12-27 LAB — BRAIN NATRIURETIC PEPTIDE: B Natriuretic Peptide: 164.4 pg/mL — ABNORMAL HIGH (ref 0.0–100.0)

## 2019-12-27 LAB — ABO/RH: ABO/RH(D): A NEG

## 2019-12-27 LAB — PHOSPHORUS: Phosphorus: 3.6 mg/dL (ref 2.5–4.6)

## 2019-12-27 SURGERY — EXTERNAL FIXATION, LOWER EXTREMITY
Anesthesia: General | Laterality: Right

## 2019-12-27 MED ORDER — ACETAMINOPHEN 500 MG PO TABS: 1000.0000 mg | ORAL_TABLET | Freq: Once | ORAL | Status: AC

## 2019-12-27 MED ORDER — SODIUM CHLORIDE 0.9 % IV BOLUS
1000.0000 mL | Freq: Once | INTRAVENOUS | Status: AC
  Administered 2019-12-27: 1000 mL via INTRAVENOUS

## 2019-12-27 MED ORDER — FENTANYL CITRATE (PF) 100 MCG/2ML IJ SOLN
INTRAMUSCULAR | Status: DC | PRN
Start: 2019-12-27 — End: 2019-12-27
  Administered 2019-12-27 (×3): 50 ug via INTRAVENOUS
  Administered 2019-12-27: 100 ug via INTRAVENOUS

## 2019-12-27 MED ORDER — ONDANSETRON HCL 4 MG/2ML IJ SOLN
INTRAMUSCULAR | Status: DC | PRN
  Administered 2019-12-27: 4 mg via INTRAVENOUS

## 2019-12-27 MED ORDER — FOLIC ACID 1 MG PO TABS
1.0000 mg | ORAL_TABLET | Freq: Every day | ORAL | Status: DC
  Filled 2019-12-27: qty 1

## 2019-12-27 MED ORDER — HYDRALAZINE HCL 20 MG/ML IJ SOLN
5.0000 mg | Freq: Four times a day (QID) | INTRAMUSCULAR | Status: DC | PRN
  Administered 2020-01-08 – 2020-02-01 (×2): 5 mg via INTRAVENOUS
  Filled 2019-12-27 (×2): qty 1

## 2019-12-27 MED ORDER — METOPROLOL TARTRATE 5 MG/5ML IV SOLN
5.0000 mg | Freq: Four times a day (QID) | INTRAVENOUS | Status: AC | PRN
  Administered 2020-01-10 – 2020-01-23 (×4): 5 mg via INTRAVENOUS
  Filled 2019-12-27 (×4): qty 5

## 2019-12-27 MED ORDER — LIDOCAINE-EPINEPHRINE 1 %-1:100000 IJ SOLN
10.0000 mL | Freq: Once | INTRAMUSCULAR | Status: AC
  Administered 2019-12-27: 10 mL
  Filled 2019-12-27: qty 1

## 2019-12-27 MED ORDER — TETANUS-DIPHTH-ACELL PERTUSSIS 5-2.5-18.5 LF-MCG/0.5 IM SUSP
0.5000 mL | Freq: Once | INTRAMUSCULAR | Status: AC
  Administered 2019-12-27: 0.5 mL via INTRAMUSCULAR

## 2019-12-27 MED ORDER — IPRATROPIUM-ALBUTEROL 0.5-2.5 (3) MG/3ML IN SOLN: 3.0000 mL | Freq: Four times a day (QID) | RESPIRATORY_TRACT | Status: DC | PRN

## 2019-12-27 MED ORDER — PHENYLEPHRINE HCL-NACL 10-0.9 MG/250ML-% IV SOLN
INTRAVENOUS | Status: DC | PRN
  Administered 2019-12-27: 50 ug/min via INTRAVENOUS

## 2019-12-27 MED ORDER — DEXTROSE 5 % IV SOLN
3.0000 g | INTRAVENOUS | Status: AC
  Administered 2019-12-27: 3 g via INTRAVENOUS
  Filled 2019-12-27: qty 3

## 2019-12-27 MED ORDER — ENOXAPARIN SODIUM 40 MG/0.4ML ~~LOC~~ SOLN: 40.0000 mg | SUBCUTANEOUS | Status: DC

## 2019-12-27 MED ORDER — ROCURONIUM BROMIDE 10 MG/ML (PF) SYRINGE
PREFILLED_SYRINGE | INTRAVENOUS | Status: AC
  Filled 2019-12-27: qty 20

## 2019-12-27 MED ORDER — ROCURONIUM BROMIDE 10 MG/ML (PF) SYRINGE
PREFILLED_SYRINGE | INTRAVENOUS | Status: DC | PRN
  Administered 2019-12-27: 20 mg via INTRAVENOUS
  Administered 2019-12-27: 50 mg via INTRAVENOUS
  Administered 2019-12-27: 30 mg via INTRAVENOUS

## 2019-12-27 MED ORDER — METOPROLOL TARTRATE 5 MG/5ML IV SOLN: 5.0000 mg | Freq: Once | INTRAVENOUS | Status: DC

## 2019-12-27 MED ORDER — DEXAMETHASONE SODIUM PHOSPHATE 10 MG/ML IJ SOLN
INTRAMUSCULAR | Status: DC | PRN
  Administered 2019-12-27: 10 mg via INTRAVENOUS

## 2019-12-27 MED ORDER — ONDANSETRON HCL 4 MG/2ML IJ SOLN: 4.0000 mg | Freq: Once | INTRAMUSCULAR | Status: DC | PRN

## 2019-12-27 MED ORDER — HYDROMORPHONE HCL 1 MG/ML IJ SOLN
1.0000 mg | Freq: Once | INTRAMUSCULAR | Status: AC
  Administered 2019-12-27: 1 mg via INTRAVENOUS
  Filled 2019-12-27: qty 1

## 2019-12-27 MED ORDER — METHOCARBAMOL 500 MG PO TABS: 500.0000 mg | ORAL_TABLET | Freq: Four times a day (QID) | ORAL | Status: DC | PRN

## 2019-12-27 MED ORDER — PANTOPRAZOLE SODIUM 40 MG PO TBEC: 40.0000 mg | DELAYED_RELEASE_TABLET | Freq: Every day | ORAL | Status: DC

## 2019-12-27 MED ORDER — MORPHINE SULFATE (PF) 2 MG/ML IV SOLN
2.0000 mg | INTRAVENOUS | Status: DC | PRN
  Administered 2019-12-27 – 2019-12-28 (×2): 2 mg via INTRAVENOUS
  Filled 2019-12-27 (×2): qty 1

## 2019-12-27 MED ORDER — LIDOCAINE 2% (20 MG/ML) 5 ML SYRINGE
INTRAMUSCULAR | Status: DC | PRN
  Administered 2019-12-27: 100 mg via INTRAVENOUS

## 2019-12-27 MED ORDER — PROPOFOL 10 MG/ML IV BOLUS
INTRAVENOUS | Status: AC
  Filled 2019-12-27: qty 20

## 2019-12-27 MED ORDER — LACTATED RINGERS IV SOLN: INTRAVENOUS | Status: DC

## 2019-12-27 MED ORDER — CEFAZOLIN SODIUM-DEXTROSE 2-4 GM/100ML-% IV SOLN
INTRAVENOUS | Status: AC
  Filled 2019-12-27: qty 100

## 2019-12-27 MED ORDER — POVIDONE-IODINE 10 % EX SWAB
2.0000 "application " | Freq: Once | CUTANEOUS | Status: AC
  Administered 2019-12-27: 2 via TOPICAL

## 2019-12-27 MED ORDER — METOCLOPRAMIDE HCL 5 MG/ML IJ SOLN
5.0000 mg | Freq: Three times a day (TID) | INTRAMUSCULAR | Status: DC | PRN
  Filled 2019-12-27: qty 2

## 2019-12-27 MED ORDER — METOCLOPRAMIDE HCL 5 MG PO TABS
5.0000 mg | ORAL_TABLET | Freq: Three times a day (TID) | ORAL | Status: DC | PRN
  Filled 2019-12-27: qty 2

## 2019-12-27 MED ORDER — PROPOFOL 500 MG/50ML IV EMUL
INTRAVENOUS | Status: DC | PRN
  Administered 2019-12-27: 50 ug/kg/min via INTRAVENOUS

## 2019-12-27 MED ORDER — ACETAMINOPHEN 500 MG PO TABS
ORAL_TABLET | ORAL | Status: AC
  Administered 2019-12-27: 1000 mg via ORAL
  Filled 2019-12-27: qty 2

## 2019-12-27 MED ORDER — LORAZEPAM 2 MG/ML IJ SOLN
1.0000 mg | INTRAMUSCULAR | Status: AC | PRN
  Administered 2019-12-28 – 2019-12-29 (×3): 4 mg via INTRAVENOUS
  Filled 2019-12-27 (×3): qty 2

## 2019-12-27 MED ORDER — SODIUM CHLORIDE 0.9 % IV SOLN: INTRAVENOUS | Status: DC

## 2019-12-27 MED ORDER — THIAMINE HCL 100 MG/ML IJ SOLN: 100.0000 mg | Freq: Every day | INTRAMUSCULAR | Status: DC

## 2019-12-27 MED ORDER — SUCCINYLCHOLINE CHLORIDE 200 MG/10ML IV SOSY
PREFILLED_SYRINGE | INTRAVENOUS | Status: DC | PRN
  Administered 2019-12-27: 140 mg via INTRAVENOUS

## 2019-12-27 MED ORDER — PROPOFOL 10 MG/ML IV BOLUS
INTRAVENOUS | Status: DC | PRN
  Administered 2019-12-27: 200 mg via INTRAVENOUS

## 2019-12-27 MED ORDER — OXYCODONE HCL 5 MG PO TABS: 5.0000 mg | ORAL_TABLET | Freq: Once | ORAL | Status: DC | PRN

## 2019-12-27 MED ORDER — PHENYLEPHRINE 40 MCG/ML (10ML) SYRINGE FOR IV PUSH (FOR BLOOD PRESSURE SUPPORT)
PREFILLED_SYRINGE | INTRAVENOUS | Status: AC
  Filled 2019-12-27: qty 10

## 2019-12-27 MED ORDER — PROPOFOL 10 MG/ML IV BOLUS
INTRAVENOUS | Status: AC
  Filled 2019-12-27: qty 40

## 2019-12-27 MED ORDER — ALBUTEROL SULFATE HFA 108 (90 BASE) MCG/ACT IN AERS
INHALATION_SPRAY | RESPIRATORY_TRACT | Status: DC | PRN
  Administered 2019-12-27: 4 via RESPIRATORY_TRACT

## 2019-12-27 MED ORDER — OXYCODONE HCL 5 MG PO TABS: 5.0000 mg | ORAL_TABLET | ORAL | Status: DC | PRN

## 2019-12-27 MED ORDER — ONDANSETRON HCL 4 MG/2ML IJ SOLN
4.0000 mg | Freq: Once | INTRAMUSCULAR | Status: AC
  Administered 2019-12-27: 4 mg via INTRAVENOUS
  Filled 2019-12-27: qty 2

## 2019-12-27 MED ORDER — ONDANSETRON HCL 4 MG/2ML IJ SOLN
INTRAMUSCULAR | Status: AC
  Filled 2019-12-27: qty 2

## 2019-12-27 MED ORDER — BUPIVACAINE HCL (PF) 0.5 % IJ SOLN
20.0000 mL | Freq: Once | INTRAMUSCULAR | Status: AC
  Administered 2019-12-27: 20 mL
  Filled 2019-12-27: qty 20

## 2019-12-27 MED ORDER — MIDAZOLAM HCL 5 MG/5ML IJ SOLN
INTRAMUSCULAR | Status: DC | PRN
  Administered 2019-12-27 (×2): 1 mg via INTRAVENOUS

## 2019-12-27 MED ORDER — OXYCODONE HCL 5 MG/5ML PO SOLN: 5.0000 mg | Freq: Once | ORAL | Status: DC | PRN

## 2019-12-27 MED ORDER — MORPHINE SULFATE (PF) 4 MG/ML IV SOLN
4.0000 mg | Freq: Once | INTRAVENOUS | Status: AC
  Administered 2019-12-27: 4 mg via INTRAVENOUS
  Filled 2019-12-27: qty 1

## 2019-12-27 MED ORDER — NEBIVOLOL HCL 10 MG PO TABS
20.0000 mg | ORAL_TABLET | Freq: Every day | ORAL | Status: DC
  Filled 2019-12-27: qty 2

## 2019-12-27 MED ORDER — PANTOPRAZOLE SODIUM 40 MG IV SOLR
40.0000 mg | Freq: Every day | INTRAVENOUS | Status: DC
  Administered 2019-12-28: 40 mg via INTRAVENOUS
  Filled 2019-12-27: qty 40

## 2019-12-27 MED ORDER — ONDANSETRON HCL 4 MG/2ML IJ SOLN: 4.0000 mg | Freq: Four times a day (QID) | INTRAMUSCULAR | Status: DC | PRN

## 2019-12-27 MED ORDER — CEFAZOLIN SODIUM-DEXTROSE 2-4 GM/100ML-% IV SOLN
2.0000 g | Freq: Four times a day (QID) | INTRAVENOUS | Status: AC
  Administered 2019-12-27 – 2019-12-28 (×2): 2 g via INTRAVENOUS
  Filled 2019-12-27 (×2): qty 100

## 2019-12-27 MED ORDER — THIAMINE HCL 100 MG PO TABS
100.0000 mg | ORAL_TABLET | Freq: Every day | ORAL | Status: DC
  Filled 2019-12-27: qty 1

## 2019-12-27 MED ORDER — ONDANSETRON 4 MG PO TBDP: 4.0000 mg | ORAL_TABLET | Freq: Four times a day (QID) | ORAL | Status: DC | PRN

## 2019-12-27 MED ORDER — MIDAZOLAM HCL 2 MG/2ML IJ SOLN
INTRAMUSCULAR | Status: AC
  Filled 2019-12-27: qty 2

## 2019-12-27 MED ORDER — 0.9 % SODIUM CHLORIDE (POUR BTL) OPTIME
TOPICAL | Status: DC | PRN
  Administered 2019-12-27: 1000 mL

## 2019-12-27 MED ORDER — PROPOFOL 1000 MG/100ML IV EMUL
5.0000 ug/kg/min | INTRAVENOUS | Status: DC
  Administered 2019-12-27: 60 ug/kg/min via INTRAVENOUS
  Administered 2019-12-28 (×2): 20 ug/kg/min via INTRAVENOUS
  Administered 2019-12-28: 50 ug/kg/min via INTRAVENOUS
  Administered 2019-12-28: 20 ug/kg/min via INTRAVENOUS
  Administered 2019-12-28: 55 ug/kg/min via INTRAVENOUS
  Administered 2019-12-29: 10 ug/kg/min via INTRAVENOUS
  Filled 2019-12-27 (×14): qty 100

## 2019-12-27 MED ORDER — CHLORHEXIDINE GLUCONATE 4 % EX LIQD
60.0000 mL | Freq: Once | CUTANEOUS | Status: DC
  Filled 2019-12-27: qty 60

## 2019-12-27 MED ORDER — ADULT MULTIVITAMIN W/MINERALS CH: 1.0000 | ORAL_TABLET | Freq: Every day | ORAL | Status: DC

## 2019-12-27 MED ORDER — PHENYLEPHRINE 40 MCG/ML (10ML) SYRINGE FOR IV PUSH (FOR BLOOD PRESSURE SUPPORT)
PREFILLED_SYRINGE | INTRAVENOUS | Status: DC | PRN
  Administered 2019-12-27: 120 ug via INTRAVENOUS
  Administered 2019-12-27: 200 ug via INTRAVENOUS

## 2019-12-27 MED ORDER — FENTANYL CITRATE (PF) 100 MCG/2ML IJ SOLN: 25.0000 ug | INTRAMUSCULAR | Status: DC | PRN

## 2019-12-27 MED ORDER — LORAZEPAM 1 MG PO TABS
1.0000 mg | ORAL_TABLET | ORAL | Status: AC | PRN
  Administered 2019-12-29: 2 mg via ORAL
  Filled 2019-12-27: qty 2

## 2019-12-27 MED ORDER — FENTANYL CITRATE (PF) 250 MCG/5ML IJ SOLN
INTRAMUSCULAR | Status: AC
  Filled 2019-12-27: qty 5

## 2019-12-27 MED ORDER — AMLODIPINE BESYLATE 5 MG PO TABS
5.0000 mg | ORAL_TABLET | Freq: Every day | ORAL | Status: DC
  Filled 2019-12-27: qty 1

## 2019-12-27 MED ORDER — ACETAMINOPHEN 325 MG PO TABS: 650.0000 mg | ORAL_TABLET | ORAL | Status: DC | PRN

## 2019-12-27 MED ORDER — IOHEXOL 300 MG/ML  SOLN
100.0000 mL | Freq: Once | INTRAMUSCULAR | Status: AC | PRN
  Administered 2019-12-27: 100 mL via INTRAVENOUS

## 2019-12-27 MED ORDER — CHLORHEXIDINE GLUCONATE 0.12 % MT SOLN
15.0000 mL | Freq: Once | OROMUCOSAL | Status: AC
  Filled 2019-12-27: qty 15

## 2019-12-27 MED ORDER — CHLORHEXIDINE GLUCONATE 0.12 % MT SOLN
OROMUCOSAL | Status: AC
  Administered 2019-12-27: 15 mL via OROMUCOSAL
  Filled 2019-12-27: qty 15

## 2019-12-27 MED ORDER — DOCUSATE SODIUM 100 MG PO CAPS: 100.0000 mg | ORAL_CAPSULE | Freq: Two times a day (BID) | ORAL | Status: DC

## 2019-12-27 MED ORDER — HYDROMORPHONE HCL 1 MG/ML IJ SOLN
0.2500 mg | INTRAMUSCULAR | Status: DC | PRN
  Administered 2019-12-28: 0.25 mg via INTRAVENOUS

## 2019-12-27 SURGICAL SUPPLY — 76 items
BANDAGE ESMARK 6X9 LF (GAUZE/BANDAGES/DRESSINGS) ×1 IMPLANT
BIT DRILL 1.5MM 95MM JCC (BIT) ×1 IMPLANT
BIT DRILL 100X2XQC STRL (BIT) ×1 IMPLANT
BIT DRILL 110MM 85MM (BIT) ×1 IMPLANT
BIT DRILL QC 2.0X100 (BIT) ×3
BIT DRL 100X2XQC STRL (BIT) ×1
BNDG CMPR 9X6 STRL LF SNTH (GAUZE/BANDAGES/DRESSINGS) ×1
BNDG COHESIVE 6X5 TAN STRL LF (GAUZE/BANDAGES/DRESSINGS) IMPLANT
BNDG ELASTIC 4X5.8 VLCR STR LF (GAUZE/BANDAGES/DRESSINGS) ×3 IMPLANT
BNDG ELASTIC 6X5.8 VLCR STR LF (GAUZE/BANDAGES/DRESSINGS) ×3 IMPLANT
BNDG ESMARK 6X9 LF (GAUZE/BANDAGES/DRESSINGS) ×3
BNDG GAUZE ELAST 4 BULKY (GAUZE/BANDAGES/DRESSINGS) ×3 IMPLANT
BRUSH SCRUB EZ PLAIN DRY (MISCELLANEOUS) ×6 IMPLANT
CANISTER WOUNDNEG PRESSURE 500 (CANNISTER) ×3 IMPLANT
CLOSURE WOUND 1/2 X4 (GAUZE/BANDAGES/DRESSINGS)
COVER SURGICAL LIGHT HANDLE (MISCELLANEOUS) ×6 IMPLANT
COVER WAND RF STERILE (DRAPES) ×3 IMPLANT
CUFF TOURN SGL QUICK 18X4 (TOURNIQUET CUFF) IMPLANT
DRAPE C-ARM 42X72 X-RAY (DRAPES) IMPLANT
DRAPE C-ARMOR (DRAPES) ×3 IMPLANT
DRAPE U-SHAPE 47X51 STRL (DRAPES) ×3 IMPLANT
DRILL BIT 110MM/85MM (BIT) ×2
DRILL BIT QC 1.5MM (BIT) ×2
DRSG ADAPTIC 3X8 NADH LF (GAUZE/BANDAGES/DRESSINGS) ×3 IMPLANT
DRSG MEPITEL 4X7.2 (GAUZE/BANDAGES/DRESSINGS) IMPLANT
ELECT REM PT RETURN 9FT ADLT (ELECTROSURGICAL) ×3
ELECTRODE REM PT RTRN 9FT ADLT (ELECTROSURGICAL) ×1 IMPLANT
EVACUATOR 1/8 PVC DRAIN (DRAIN) IMPLANT
GAUZE SPONGE 4X4 12PLY STRL (GAUZE/BANDAGES/DRESSINGS) ×3 IMPLANT
GLOVE BIO SURGEON STRL SZ7.5 (GLOVE) ×3 IMPLANT
GLOVE BIO SURGEON STRL SZ8 (GLOVE) ×3 IMPLANT
GLOVE BIOGEL PI IND STRL 7.5 (GLOVE) ×1 IMPLANT
GLOVE BIOGEL PI IND STRL 8 (GLOVE) ×1 IMPLANT
GLOVE BIOGEL PI INDICATOR 7.5 (GLOVE) ×2
GLOVE BIOGEL PI INDICATOR 8 (GLOVE) ×2
GOWN STRL REUS W/ TWL LRG LVL3 (GOWN DISPOSABLE) ×2 IMPLANT
GOWN STRL REUS W/ TWL XL LVL3 (GOWN DISPOSABLE) ×1 IMPLANT
GOWN STRL REUS W/TWL LRG LVL3 (GOWN DISPOSABLE) ×6
GOWN STRL REUS W/TWL XL LVL3 (GOWN DISPOSABLE) ×3
K-WIRE DBL TROCAR .045X4 (WIRE) ×6
K-WIRE DBL TROCAR .062X4 (WIRE) ×6
KIT BASIN OR (CUSTOM PROCEDURE TRAY) ×3 IMPLANT
KIT INFUSE SMALL (Orthopedic Implant) ×3 IMPLANT
KIT TURNOVER KIT B (KITS) ×3 IMPLANT
KWIRE DBL TROCAR .045X4 (WIRE) ×2 IMPLANT
KWIRE DBL TROCAR .062X4 (WIRE) ×2 IMPLANT
MANIFOLD NEPTUNE II (INSTRUMENTS) ×3 IMPLANT
NEEDLE 22X1 1/2 (OR ONLY) (NEEDLE) IMPLANT
NS IRRIG 1000ML POUR BTL (IV SOLUTION) ×3 IMPLANT
PACK ORTHO EXTREMITY (CUSTOM PROCEDURE TRAY) ×3 IMPLANT
PAD ABD 8X10 STRL (GAUZE/BANDAGES/DRESSINGS) ×3 IMPLANT
PAD ARMBOARD 7.5X6 YLW CONV (MISCELLANEOUS) ×6 IMPLANT
PAD CAST 4YDX4 CTTN HI CHSV (CAST SUPPLIES) ×1 IMPLANT
PADDING CAST ABS 6INX4YD NS (CAST SUPPLIES) ×2
PADDING CAST ABS COTTON 6X4 NS (CAST SUPPLIES) ×1 IMPLANT
PADDING CAST COTTON 4X4 STRL (CAST SUPPLIES) ×3
PADDING CAST COTTON 6X4 STRL (CAST SUPPLIES) ×3 IMPLANT
PLATE CONDYLAR 2.0 7H 39M R (Plate) ×3 IMPLANT
PREVENA RESTOR AXIOFORM 29X28 (GAUZE/BANDAGES/DRESSINGS) ×3 IMPLANT
SCREW CORTEX 2.0 30MM (Screw) ×3 IMPLANT
SCREW CORTEX 2.0 32MM (Screw) ×3 IMPLANT
SCREW CORTEX 2.0 34MM (Screw) ×3 IMPLANT
SCREW CORTEX 2.0 36MM (Screw) ×3 IMPLANT
SCREW CORTEX 2.0 38MM (Screw) ×3 IMPLANT
SPONGE LAP 18X18 RF (DISPOSABLE) ×3 IMPLANT
STAPLER VISISTAT 35W (STAPLE) IMPLANT
STOCKINETTE IMPERVIOUS LG (DRAPES) IMPLANT
STRIP CLOSURE SKIN 1/2X4 (GAUZE/BANDAGES/DRESSINGS) IMPLANT
SUT ETHILON 3 0 PS 1 (SUTURE) ×3 IMPLANT
SUT VIC AB 0 CT1 27 (SUTURE) ×3
SUT VIC AB 0 CT1 27XBRD ANBCTR (SUTURE) ×1 IMPLANT
SUT VIC AB 2-0 CT1 27 (SUTURE) ×3
SUT VIC AB 2-0 CT1 TAPERPNT 27 (SUTURE) ×1 IMPLANT
TOWEL GREEN STERILE (TOWEL DISPOSABLE) ×6 IMPLANT
TOWEL GREEN STERILE FF (TOWEL DISPOSABLE) ×6 IMPLANT
UNDERPAD 30X36 HEAVY ABSORB (UNDERPADS AND DIAPERS) ×3 IMPLANT

## 2019-12-27 NOTE — ED Provider Notes (Signed)
 ..  Laceration Repair  Date/Time: 12/27/2019 11:03 AM Performed by: Anselm Pancoast, PA-C Authorized by: Anselm Pancoast, PA-C   Consent:    Consent obtained:  Verbal   Consent given by:  Patient   Risks discussed:  Infection, need for additional repair, pain, poor cosmetic result, poor wound healing, retained foreign body, vascular damage and nerve damage Anesthesia (see MAR for exact dosages):    Anesthesia method:  Local infiltration   Local anesthetic:  Lidocaine 1% WITH epi and bupivacaine 0.5% w/o epi Laceration details:    Location:  Scalp   Scalp location:  Frontal   Length (cm):  12 Repair type:    Repair type:  Complex Pre-procedure details:    Preparation:  Patient was prepped and draped in usual sterile fashion and imaging obtained to evaluate for foreign bodies Exploration:    Hemostasis achieved with:  Epinephrine   Wound exploration: wound explored through full range of motion and entire depth of wound probed and visualized   Treatment:    Area cleansed with:  Betadine and saline   Amount of cleaning:  Extensive   Irrigation solution:  Sterile saline   Irrigation method:  Syringe   Debridement:  Minimal   Undermining:  None Subcutaneous repair:    Suture size:  4-0   Suture material:  Vicryl ([place])   Suture technique:  Figure eight   Number of sutures:  5 Skin repair:    Repair method:  Sutures   Suture size:  4-0   Suture material:  Prolene   Suture technique:  Simple interrupted   Number of sutures:  20 Approximation:    Approximation:  Close Post-procedure details:    Dressing:  Non-adherent dressing and sterile dressing   Patient tolerance of procedure:  Tolerated well, no immediate complications                Concepcion Living 12/27/19 1222    Jacalyn Lefevre, MD 12/27/19 1517
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
## 2019-12-27 NOTE — Anesthesia Postprocedure Evaluation (Signed)
Anesthesia Post Note  Patient: Roberto Lynch  Procedure(s) Performed: EXTERNAL FIXATION LEG (Right )     Patient location during evaluation: PACU Anesthesia Type: General Level of consciousness: sedated and patient remains intubated per anesthesia plan Pain management: pain level controlled Vital Signs Assessment: post-procedure vital signs reviewed and stable Respiratory status: patient remains intubated per anesthesia plan Cardiovascular status: stable Postop Assessment: no apparent nausea or vomiting Anesthetic complications: no   No complications documented.  Last Vitals:  Vitals:   12/27/19 1938 12/27/19 1953  BP: 101/70 102/69  Pulse: 74 79  Resp: 20 20  Temp:    SpO2: 98% 98%    Last Pain:  Vitals:   12/27/19 0822  TempSrc: (S) Oral  PainSc:                  Beryle Lathe

## 2019-12-27 NOTE — Brief Op Note (Addendum)
12/27/2019  10:20 PM  PATIENT:  Roberto Lynch  64 y.o. male  PRE-OPERATIVE DIAGNOSIS:   1. RIGHT COMMINUTED TALAR NECK FRACTURE 2. RIGHT SUBTALAR DISLOCATION  POST-OPERATIVE DIAGNOSIS:   1. RIGHT COMMINUTED TALAR NECK FRACTURE 2. RIGHT SUBTALAR DISLOCATION  PROCEDURE:   1. OPEN REDUCTION INTERNAL FIXATION OF RIGHT TALUS FRACTURE 2. OPEN REDUCTION OF RIGHT SUBTALAR DISLOCATION  SURGEON:  Surgeon(s) and Role:    Myrene Galas, MD - Primary  PHYSICIAN ASSISTANT: Montez Morita, PA-C  ANESTHESIA:   general  EBL:  50 mL   BLOOD ADMINISTERED:none  DRAINS: none   LOCAL MEDICATIONS USED:  NONE  SPECIMEN:  No Specimen  DISPOSITION OF SPECIMEN:  N/A  COUNTS:  YES  TOURNIQUET:  * Missing tourniquet times found for documented tourniquets in log: [id number] *  DICTATION: [id number]  PLAN OF CARE: Admit to inpatient   PATIENT DISPOSITION:  ICU - intubated and hemodynamically stable.   Delay start of Pharmacological VTE agent (>24hrs) due to surgical blood loss or risk of bleeding: no
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
## 2019-12-27 NOTE — Progress Notes (Signed)
D/w Dr. Desmond Lope pt to remained intubated 2/2 to hypercarbia in the OR. Will be going to ICU and beds pending currently.  To stay in PACU until bed becomes available.  Labs pending.

## 2019-12-27 NOTE — Anesthesia Procedure Notes (Signed)
Procedure Name: Intubation Date/Time: 12/27/2019 2:52 PM Performed by: Elliot Dally, CRNA Pre-anesthesia Checklist: Patient identified, Emergency Drugs available, Suction available and Patient being monitored Patient Re-evaluated:Patient Re-evaluated prior to induction Oxygen Delivery Method: Circle System Utilized Preoxygenation: Pre-oxygenation with 100% oxygen Induction Type: IV induction and Rapid sequence Laryngoscope Size: Glidescope and 4 Grade View: Grade I Tube type: Oral Tube size: 7.5 mm Number of attempts: 1 Airway Equipment and Method: Stylet and Oral airway Placement Confirmation: ETT inserted through vocal cords under direct vision,  positive ETCO2 and breath sounds checked- equal and bilateral Secured at: 22 cm Tube secured with: Tape Dental Injury: Teeth and Oropharynx as per pre-operative assessment

## 2019-12-27 NOTE — ED Notes (Signed)
Family updated as to patient's status.-- wife -- [phone] [facility]

## 2019-12-27 NOTE — Transfer of Care (Signed)
Immediate Anesthesia Transfer of Care Note  Patient: Roberto Lynch  Procedure(s) Performed: EXTERNAL FIXATION LEG (Right )  Patient Location: PACU  Anesthesia Type:General  Level of Consciousness: sedated and Patient remains intubated per anesthesia plan  Airway & Oxygen Therapy: Patient remains intubated per anesthesia plan and Patient placed on Ventilator (see vital sign flow sheet for setting)  Post-op Assessment: Report given to RN and Post -op Vital signs reviewed and stable  Post vital signs: Reviewed and stable  Last Vitals:  Vitals Value Taken Time  BP 93/68 12/27/19 1827  Temp    Pulse 79 12/27/19 1829  Resp 19 12/27/19 1829  SpO2 95 % 12/27/19 1829  Vitals shown include unvalidated device data.  Last Pain:  Vitals:   12/27/19 0822  TempSrc: (S) Oral  PainSc:          Complications: No complications documented.
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
## 2019-12-28 ENCOUNTER — Inpatient Hospital Stay (HOSPITAL_COMMUNITY): Payer: 59

## 2019-12-28 ENCOUNTER — Encounter (HOSPITAL_COMMUNITY): Payer: Self-pay | Admitting: Orthopedic Surgery

## 2019-12-28 DIAGNOSIS — R9431 Abnormal electrocardiogram [ECG] [EKG]: Secondary | ICD-10-CM

## 2019-12-28 DIAGNOSIS — F17200 Nicotine dependence, unspecified, uncomplicated: Secondary | ICD-10-CM

## 2019-12-28 HISTORY — DX: Nicotine dependence, unspecified, uncomplicated: F17.200

## 2019-12-28 LAB — CBC
HCT: 48.8 % (ref 39.0–52.0)
Hemoglobin: 15.2 g/dL (ref 13.0–17.0)
MCH: 32.4 pg (ref 26.0–34.0)
MCHC: 31.1 g/dL (ref 30.0–36.0)
MCV: 104.1 fL — ABNORMAL HIGH (ref 80.0–100.0)
Platelets: UNDETERMINED 10*3/uL (ref 150–400)
RBC: 4.69 MIL/uL (ref 4.22–5.81)
RDW: 13.7 % (ref 11.5–15.5)
WBC: 10.8 10*3/uL — ABNORMAL HIGH (ref 4.0–10.5)
nRBC: 0.4 % — ABNORMAL HIGH (ref 0.0–0.2)

## 2019-12-28 LAB — POCT I-STAT 7, (LYTES, BLD GAS, ICA,H+H)
Acid-Base Excess: 1 mmol/L (ref 0.0–2.0)
Acid-Base Excess: 2 mmol/L (ref 0.0–2.0)
Acid-base deficit: 2 mmol/L (ref 0.0–2.0)
Bicarbonate: 28.2 mmol/L — ABNORMAL HIGH (ref 20.0–28.0)
Bicarbonate: 28.3 mmol/L — ABNORMAL HIGH (ref 20.0–28.0)
Bicarbonate: 26.7 mmol/L (ref 20.0–28.0)
Calcium, Ion: 1.26 mmol/L (ref 1.15–1.40)
Calcium, Ion: 1.24 mmol/L (ref 1.15–1.40)
Calcium, Ion: 1.22 mmol/L (ref 1.15–1.40)
HCT: 52 % (ref 39.0–52.0)
HCT: 47 % (ref 39.0–52.0)
HCT: 43 % (ref 39.0–52.0)
Hemoglobin: 17.7 g/dL — ABNORMAL HIGH (ref 13.0–17.0)
Hemoglobin: 16 g/dL (ref 13.0–17.0)
Hemoglobin: 14.6 g/dL (ref 13.0–17.0)
O2 Saturation: 92 %
O2 Saturation: 84 %
O2 Saturation: 96 %
Patient temperature: 36.1
Patient temperature: 98.6
Potassium: 5.4 mmol/L — ABNORMAL HIGH (ref 3.5–5.1)
Potassium: 4.3 mmol/L (ref 3.5–5.1)
Potassium: 4.1 mmol/L (ref 3.5–5.1)
Sodium: 141 mmol/L (ref 135–145)
Sodium: 142 mmol/L (ref 135–145)
Sodium: 140 mmol/L (ref 135–145)
TCO2: 30 mmol/L (ref 22–32)
TCO2: 30 mmol/L (ref 22–32)
TCO2: 28 mmol/L (ref 22–32)
pCO2 arterial: 68.9 mmHg (ref 32.0–48.0)
pCO2 arterial: 55.7 mmHg — ABNORMAL HIGH (ref 32.0–48.0)
pCO2 arterial: 39.3 mmHg (ref 32.0–48.0)
pH, Arterial: 7.215 — ABNORMAL LOW (ref 7.350–7.450)
pH, Arterial: 7.315 — ABNORMAL LOW (ref 7.350–7.450)
pH, Arterial: 7.44 (ref 7.350–7.450)
pO2, Arterial: 75 mmHg — ABNORMAL LOW (ref 83.0–108.0)
pO2, Arterial: 54 mmHg — ABNORMAL LOW (ref 83.0–108.0)
pO2, Arterial: 78 mmHg — ABNORMAL LOW (ref 83.0–108.0)

## 2019-12-28 LAB — COMPREHENSIVE METABOLIC PANEL
ALT: 42 U/L (ref 0–44)
AST: 36 U/L (ref 15–41)
Albumin: 3 g/dL — ABNORMAL LOW (ref 3.5–5.0)
Alkaline Phosphatase: 66 U/L (ref 38–126)
Anion gap: 10 (ref 5–15)
BUN: 15 mg/dL (ref 8–23)
CO2: 24 mmol/L (ref 22–32)
Calcium: 9.2 mg/dL (ref 8.9–10.3)
Chloride: 104 mmol/L (ref 98–111)
Creatinine, Ser: 0.93 mg/dL (ref 0.61–1.24)
GFR calc Af Amer: >60 mL/min (ref >60)
GFR calc non Af Amer: >60 mL/min (ref >60)
Glucose, Bld: 122 mg/dL — ABNORMAL HIGH (ref 70–99)
Potassium: 3.9 mmol/L (ref 3.5–5.1)
Sodium: 138 mmol/L (ref 135–145)
Total Bilirubin: 0.9 mg/dL (ref 0.3–1.2)
Total Protein: 5.8 g/dL — ABNORMAL LOW (ref 6.5–8.1)

## 2019-12-28 LAB — GLUCOSE, CAPILLARY: Glucose-Capillary: 131 mg/dL — ABNORMAL HIGH (ref 70–99)

## 2019-12-28 LAB — MAGNESIUM: Magnesium: 2.3 mg/dL (ref 1.7–2.4)

## 2019-12-28 LAB — RAPID URINE DRUG SCREEN, HOSP PERFORMED
Amphetamines: NOT DETECTED
Barbiturates: NOT DETECTED
Benzodiazepines: POSITIVE — AB
Cocaine: NOT DETECTED
Opiates: POSITIVE — AB
Tetrahydrocannabinol: NOT DETECTED

## 2019-12-28 LAB — ECHOCARDIOGRAM COMPLETE
Height: 69 in
S' Lateral: 3 cm
Weight: 4391.56 oz

## 2019-12-28 LAB — PHOSPHORUS: Phosphorus: 1.9 mg/dL — ABNORMAL LOW (ref 2.5–4.6)

## 2019-12-28 LAB — VITAMIN D 25 HYDROXY (VIT D DEFICIENCY, FRACTURES): Vit D, 25-Hydroxy: 12.81 ng/mL — ABNORMAL LOW (ref 30–100)

## 2019-12-28 LAB — LACTIC ACID, PLASMA: Lactic Acid, Venous: 1.8 mmol/L (ref 0.5–1.9)

## 2019-12-28 MED ORDER — FUROSEMIDE 10 MG/ML IJ SOLN: 20.0000 mg | Freq: Once | INTRAMUSCULAR | Status: DC

## 2019-12-28 MED ORDER — ENOXAPARIN SODIUM 40 MG/0.4ML ~~LOC~~ SOLN
40.0000 mg | Freq: Two times a day (BID) | SUBCUTANEOUS | Status: DC
  Administered 2019-12-28: 40 mg via SUBCUTANEOUS
  Filled 2019-12-28: qty 0.4

## 2019-12-28 MED ORDER — HEPARIN (PORCINE) 25000 UT/250ML-% IV SOLN
2300.0000 [IU]/h | INTRAVENOUS | Status: DC
  Administered 2019-12-28: 1200 [IU]/h via INTRAVENOUS
  Administered 2019-12-29: 1400 [IU]/h via INTRAVENOUS
  Administered 2019-12-30: 2100 [IU]/h via INTRAVENOUS
  Administered 2019-12-30: 2200 [IU]/h via INTRAVENOUS
  Administered 2019-12-31: 2250 [IU]/h via INTRAVENOUS
  Administered 2019-12-31: 2500 [IU]/h via INTRAVENOUS
  Administered 2020-01-01: 2600 [IU]/h via INTRAVENOUS
  Administered 2020-01-01: 2800 [IU]/h via INTRAVENOUS
  Administered 2020-01-02: 2700 [IU]/h via INTRAVENOUS
  Administered 2020-01-02: 2800 [IU]/h via INTRAVENOUS
  Administered 2020-01-03 (×3): 2700 [IU]/h via INTRAVENOUS
  Administered 2020-01-04: 2600 [IU]/h via INTRAVENOUS
  Administered 2020-01-05: 2300 [IU]/h via INTRAVENOUS
  Filled 2019-12-28 (×17): qty 250

## 2019-12-28 MED ORDER — PANTOPRAZOLE SODIUM 40 MG PO PACK
40.0000 mg | PACK | Freq: Every day | ORAL | Status: DC
  Administered 2019-12-29 – 2020-01-09 (×12): 40 mg
  Filled 2019-12-28 (×12): qty 20

## 2019-12-28 MED ORDER — VITAL HIGH PROTEIN PO LIQD
1000.0000 mL | ORAL | Status: DC
  Administered 2019-12-28 – 2020-01-02 (×11): 1000 mL

## 2019-12-28 MED ORDER — FENTANYL 2500MCG IN NS 250ML (10MCG/ML) PREMIX INFUSION
50.0000 ug/h | INTRAVENOUS | Status: DC
  Administered 2019-12-28 – 2019-12-29 (×2): 100 ug/h via INTRAVENOUS
  Administered 2019-12-30: 50 ug/h via INTRAVENOUS
  Administered 2020-01-01: 100 ug/h via INTRAVENOUS
  Filled 2019-12-28 (×5): qty 250

## 2019-12-28 MED ORDER — OXYCODONE HCL 5 MG PO TABS
5.0000 mg | ORAL_TABLET | ORAL | Status: DC | PRN
  Administered 2019-12-28 – 2019-12-29 (×2): 10 mg
  Administered 2019-12-30: 5 mg
  Administered 2019-12-31: 10 mg
  Administered 2020-01-05: 5 mg
  Administered 2020-01-05: 10 mg
  Administered 2020-01-06: 5 mg
  Administered 2020-01-06 – 2020-01-07 (×4): 10 mg
  Administered 2020-01-10: 5 mg
  Administered 2020-01-10 – 2020-01-15 (×8): 10 mg
  Administered 2020-01-16: 5 mg
  Administered 2020-01-16 – 2020-01-19 (×8): 10 mg
  Filled 2019-12-28 (×2): qty 2
  Filled 2019-12-28: qty 1
  Filled 2019-12-28 (×4): qty 2
  Filled 2019-12-28: qty 1
  Filled 2019-12-28 (×15): qty 2
  Filled 2019-12-28: qty 1
  Filled 2019-12-28 (×6): qty 2

## 2019-12-28 MED ORDER — CHLORHEXIDINE GLUCONATE 0.12% ORAL RINSE (MEDLINE KIT)
15.0000 mL | Freq: Two times a day (BID) | OROMUCOSAL | Status: DC
  Administered 2019-12-28 – 2020-01-01 (×10): 15 mL via OROMUCOSAL

## 2019-12-28 MED ORDER — FENTANYL BOLUS VIA INFUSION
50.0000 ug | INTRAVENOUS | Status: DC | PRN
  Administered 2019-12-29: 50 ug via INTRAVENOUS
  Filled 2019-12-28: qty 50

## 2019-12-28 MED ORDER — PROSOURCE TF PO LIQD
90.0000 mL | Freq: Three times a day (TID) | ORAL | Status: DC
  Administered 2019-12-28 – 2020-01-03 (×19): 90 mL
  Filled 2019-12-28 (×19): qty 90

## 2019-12-28 MED ORDER — ENOXAPARIN SODIUM 40 MG/0.4ML ~~LOC~~ SOLN: 40.0000 mg | SUBCUTANEOUS | Status: DC

## 2019-12-28 MED ORDER — CEFAZOLIN SODIUM-DEXTROSE 2-4 GM/100ML-% IV SOLN
INTRAVENOUS | Status: AC
  Administered 2019-12-28: 2 g via INTRAVENOUS
  Filled 2019-12-28: qty 100

## 2019-12-28 MED ORDER — ONDANSETRON HCL 4 MG PO TABS
4.0000 mg | ORAL_TABLET | Freq: Four times a day (QID) | ORAL | Status: DC | PRN
  Administered 2020-01-13: 4 mg via ORAL
  Filled 2019-12-28: qty 1

## 2019-12-28 MED ORDER — PROSOURCE TF PO LIQD: 45.0000 mL | Freq: Two times a day (BID) | ORAL | Status: DC

## 2019-12-28 MED ORDER — SPIRITUS FRUMENTI
1.0000 | Freq: Three times a day (TID) | ORAL | Status: DC
  Administered 2019-12-28 – 2020-01-09 (×38): 1
  Filled 2019-12-28 (×40): qty 1

## 2019-12-28 MED ORDER — METHOCARBAMOL 500 MG PO TABS
1000.0000 mg | ORAL_TABLET | Freq: Three times a day (TID) | ORAL | Status: DC
  Administered 2019-12-28 – 2020-02-05 (×112): 1000 mg
  Filled 2019-12-28 (×110): qty 2

## 2019-12-28 MED ORDER — ADULT MULTIVITAMIN W/MINERALS CH
1.0000 | ORAL_TABLET | Freq: Every day | ORAL | Status: DC
  Administered 2019-12-28 – 2020-02-08 (×41): 1
  Filled 2019-12-28 (×43): qty 1

## 2019-12-28 MED ORDER — HYDROMORPHONE HCL 1 MG/ML IJ SOLN
INTRAMUSCULAR | Status: AC
  Filled 2019-12-28: qty 1

## 2019-12-28 MED ORDER — SPIRITUS FRUMENTI
1.0000 | Freq: Three times a day (TID) | ORAL | Status: DC
  Filled 2019-12-28 (×2): qty 1

## 2019-12-28 MED ORDER — ONDANSETRON HCL 4 MG/2ML IJ SOLN
4.0000 mg | Freq: Four times a day (QID) | INTRAMUSCULAR | Status: DC | PRN
  Administered 2020-01-05: 4 mg via INTRAVENOUS
  Filled 2019-12-28: qty 2

## 2019-12-28 MED ORDER — ORAL CARE MOUTH RINSE
15.0000 mL | OROMUCOSAL | Status: DC
  Administered 2019-12-28 – 2020-01-01 (×45): 15 mL via OROMUCOSAL

## 2019-12-28 MED ORDER — DOCUSATE SODIUM 50 MG/5ML PO LIQD
100.0000 mg | Freq: Two times a day (BID) | ORAL | Status: DC
  Administered 2019-12-28 – 2020-01-05 (×11): 100 mg
  Filled 2019-12-28 (×14): qty 10

## 2019-12-28 MED ORDER — METHOCARBAMOL 500 MG PO TABS: 1000.0000 mg | ORAL_TABLET | Freq: Three times a day (TID) | ORAL | Status: DC

## 2019-12-28 MED ORDER — ACETAMINOPHEN 500 MG PO TABS
1000.0000 mg | ORAL_TABLET | Freq: Four times a day (QID) | ORAL | Status: DC
  Administered 2019-12-28 – 2019-12-29 (×4): 1000 mg
  Administered 2019-12-29: 500 mg
  Administered 2019-12-29 – 2019-12-30 (×3): 1000 mg
  Administered 2019-12-30: 500 mg
  Administered 2019-12-30 – 2020-01-10 (×42): 1000 mg
  Filled 2019-12-28 (×53): qty 2

## 2019-12-28 MED ORDER — DOCUSATE SODIUM 100 MG PO CAPS: 100.0000 mg | ORAL_CAPSULE | Freq: Two times a day (BID) | ORAL | Status: DC

## 2019-12-28 MED ORDER — NEBIVOLOL HCL 10 MG PO TABS
20.0000 mg | ORAL_TABLET | Freq: Every day | ORAL | Status: DC
  Administered 2019-12-28 – 2020-01-19 (×22): 20 mg
  Filled 2019-12-28 (×23): qty 2

## 2019-12-28 MED ORDER — CHLORHEXIDINE GLUCONATE CLOTH 2 % EX PADS
6.0000 | MEDICATED_PAD | Freq: Every day | CUTANEOUS | Status: DC
  Administered 2019-12-28 – 2020-01-07 (×11): 6 via TOPICAL

## 2019-12-28 MED ORDER — FENTANYL CITRATE (PF) 100 MCG/2ML IJ SOLN
50.0000 ug | Freq: Once | INTRAMUSCULAR | Status: AC
  Administered 2019-12-28: 50 ug via INTRAVENOUS

## 2019-12-28 MED ORDER — FUROSEMIDE 10 MG/ML IJ SOLN
40.0000 mg | Freq: Once | INTRAMUSCULAR | Status: AC
  Administered 2019-12-28: 40 mg via INTRAVENOUS
  Filled 2019-12-28: qty 4

## 2019-12-28 MED ORDER — FOLIC ACID 1 MG PO TABS
1.0000 mg | ORAL_TABLET | Freq: Every day | ORAL | Status: DC
  Administered 2019-12-28 – 2020-02-05 (×39): 1 mg
  Filled 2019-12-28 (×39): qty 1

## 2019-12-28 MED ORDER — AMLODIPINE BESYLATE 5 MG PO TABS
5.0000 mg | ORAL_TABLET | Freq: Every day | ORAL | Status: DC
  Administered 2019-12-28 – 2019-12-29 (×2): 5 mg
  Filled 2019-12-28: qty 1

## 2019-12-28 MED ORDER — FUROSEMIDE 10 MG/ML IJ SOLN
20.0000 mg | Freq: Once | INTRAMUSCULAR | Status: AC
  Administered 2019-12-28: 20 mg via INTRAVENOUS
  Filled 2019-12-28: qty 2

## 2019-12-28 MED ORDER — THIAMINE HCL 100 MG PO TABS
100.0000 mg | ORAL_TABLET | Freq: Every day | ORAL | Status: DC
  Administered 2019-12-28 – 2020-02-05 (×39): 100 mg
  Filled 2019-12-28 (×40): qty 1

## 2019-12-28 MED ORDER — ACETAMINOPHEN 500 MG PO TABS: 1000.0000 mg | ORAL_TABLET | ORAL | Status: DC | PRN
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
## 2019-12-28 NOTE — Progress Notes (Signed)
Touched base with Dr. Derrell Lolling to update on patients condition.  Nothing has really changed since I assumed care.  I just want to make sure he is aware.  Pt is still on propofol gtt at .  Pt doesn't respond to stimuli, pupils are pinpoint.  Pt is still in afib and is rate controlled 70's to 80's.  Plan to extubate in am.  Pt will remain in PACU until a bed opens up.  Will continue to monitor and notify for changes.

## 2019-12-28 NOTE — Progress Notes (Signed)
OT Cancellation Note  Patient Details Name: Roberto Lynch MRN: [id number] DOB: Dec 23, 1955   Cancelled Treatment:    Reason Eval/Treat Not Completed: Patient not medically ready. Pt remains on vent s/p surgery. Will assess when medically appropriate.   Thornell Mule, OT/L   Acute OT Clinical Specialist Acute Rehabilitation Services Pager [phone] Office [phone]  12/28/2019, 7:28 AM

## 2019-12-28 NOTE — Procedures (Signed)
Cortrak  Person Inserting Tube:  Jalayah Gutridge C, RD Tube Type:  Cortrak - 43 inches Tube Location:  Left nare Initial Placement:  Stomach Secured by: Bridle Technique Used to Measure Tube Placement:  Documented cm marking at nare/ corner of mouth Cortrak Secured At:  72 cm    Cortrak Tube Team Note:  Consult received to place a Cortrak feeding tube.   No x-ray is required. RN may begin using tube.   If the tube becomes dislodged please keep the tube and contact the Cortrak team at [email or web address] ([email or web address]) for replacement.  If after hours and replacement cannot be delayed, place a NG tube and confirm placement with an abdominal x-ray.    Reygan Heagle P., RD, LDN, CNSC See AMiON for contact information    
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
## 2019-12-28 NOTE — Progress Notes (Signed)
  Echocardiogram 2D Echocardiogram has been performed.  Tye Savoy 12/28/2019, 2:10 PM
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
## 2019-12-28 NOTE — Progress Notes (Signed)
Patient transported from PACU to 4N16 without complications.

## 2019-12-28 NOTE — Plan of Care (Signed)
  Problem: Clinical Measurements: Goal: Will remain free from infection Outcome: Progressing Goal: Respiratory complications will improve Outcome: Progressing   Problem: Nutrition: Goal: Adequate nutrition will be maintained Outcome: Progressing   Problem: Pain Managment: Goal: General experience of comfort will improve Outcome: Progressing   Problem: Safety: Goal: Ability to remain free from injury will improve Outcome: Progressing   Problem: Skin Integrity: Goal: Risk for impaired skin integrity will decrease Outcome: Progressing

## 2019-12-28 NOTE — TOC CAGE-AID Note (Signed)
Transition of Care [facility]) - CAGE-AID Screening   Patient Details  Name: Roberto Lynch MRN: [id number] Date of Birth: 29-Mar-1956  [facility] CM/SW Contact:    Jimmy Picket, [place] Phone Number: 12/28/2019, 10:07 AM   Clinical Narrative:  Pt is unable to participate in assessment  CAGE-AID Screening: Substance Abuse Screening unable to be completed due to: : Patient unable to participate
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
## 2019-12-29 ENCOUNTER — Inpatient Hospital Stay (HOSPITAL_COMMUNITY): Payer: 59

## 2019-12-29 ENCOUNTER — Encounter (HOSPITAL_COMMUNITY): Payer: Self-pay | Admitting: Orthopedic Surgery

## 2019-12-29 ENCOUNTER — Inpatient Hospital Stay: Payer: Self-pay

## 2019-12-29 DIAGNOSIS — I480 Paroxysmal atrial fibrillation: Secondary | ICD-10-CM | POA: Diagnosis not present

## 2019-12-29 LAB — POCT I-STAT 7, (LYTES, BLD GAS, ICA,H+H)
Acid-Base Excess: 4 mmol/L — ABNORMAL HIGH (ref 0.0–2.0)
Bicarbonate: 27.9 mmol/L (ref 20.0–28.0)
Calcium, Ion: 1.29 mmol/L (ref 1.15–1.40)
HCT: 41 % (ref 39.0–52.0)
Hemoglobin: 13.9 g/dL (ref 13.0–17.0)
O2 Saturation: 98 %
Patient temperature: 99.1
Potassium: 3.5 mmol/L (ref 3.5–5.1)
Sodium: 142 mmol/L (ref 135–145)
TCO2: 29 mmol/L (ref 22–32)
pCO2 arterial: 39.2 mmHg (ref 32.0–48.0)
pH, Arterial: 7.462 — ABNORMAL HIGH (ref 7.350–7.450)
pO2, Arterial: 96 mmHg (ref 83.0–108.0)

## 2019-12-29 LAB — BASIC METABOLIC PANEL
Anion gap: 8 (ref 5–15)
BUN: 18 mg/dL (ref 8–23)
CO2: 26 mmol/L (ref 22–32)
Calcium: 9.2 mg/dL (ref 8.9–10.3)
Chloride: 106 mmol/L (ref 98–111)
Creatinine, Ser: 0.81 mg/dL (ref 0.61–1.24)
GFR calc Af Amer: >60 mL/min (ref >60)
GFR calc non Af Amer: >60 mL/min (ref >60)
Glucose, Bld: 134 mg/dL — ABNORMAL HIGH (ref 70–99)
Potassium: 3.8 mmol/L (ref 3.5–5.1)
Sodium: 140 mmol/L (ref 135–145)

## 2019-12-29 LAB — GLUCOSE, CAPILLARY
Glucose-Capillary: 118 mg/dL — ABNORMAL HIGH (ref 70–99)
Glucose-Capillary: 111 mg/dL — ABNORMAL HIGH (ref 70–99)

## 2019-12-29 LAB — CBC
HCT: 44.9 % (ref 39.0–52.0)
Hemoglobin: 14.3 g/dL (ref 13.0–17.0)
MCH: 32.8 pg (ref 26.0–34.0)
MCHC: 31.8 g/dL (ref 30.0–36.0)
MCV: 103 fL — ABNORMAL HIGH (ref 80.0–100.0)
Platelets: 108 10*3/uL — ABNORMAL LOW (ref 150–400)
RBC: 4.36 MIL/uL (ref 4.22–5.81)
RDW: 13.5 % (ref 11.5–15.5)
WBC: 9.4 10*3/uL (ref 4.0–10.5)
nRBC: 0.3 % — ABNORMAL HIGH (ref 0.0–0.2)

## 2019-12-29 LAB — HEPARIN LEVEL (UNFRACTIONATED)
Heparin Unfractionated: 0.13 IU/mL — ABNORMAL LOW (ref 0.30–0.70)
Heparin Unfractionated: 0.1 [IU]/mL — ABNORMAL LOW (ref 0.30–0.70)
Heparin Unfractionated: 0.12 [IU]/mL — ABNORMAL LOW (ref 0.30–0.70)

## 2019-12-29 LAB — PHOSPHORUS: Phosphorus: 2.1 mg/dL — ABNORMAL LOW (ref 2.5–4.6)

## 2019-12-29 LAB — MAGNESIUM: Magnesium: 2.2 mg/dL (ref 1.7–2.4)

## 2019-12-29 MED ORDER — SODIUM CHLORIDE 0.9% FLUSH
10.0000 mL | Freq: Two times a day (BID) | INTRAVENOUS | Status: DC
  Administered 2019-12-29: 20 mL
  Administered 2019-12-29 – 2020-01-10 (×21): 10 mL
  Administered 2020-01-10 – 2020-01-12 (×3): 20 mL
  Administered 2020-01-12: 10 mL
  Administered 2020-01-13: 20 mL
  Administered 2020-01-13: 10 mL

## 2019-12-29 MED ORDER — ALBUMIN HUMAN 25 % IV SOLN
12.5000 g | Freq: Once | INTRAVENOUS | Status: AC
  Administered 2019-12-29: 12.5 g via INTRAVENOUS
  Filled 2019-12-29: qty 50

## 2019-12-29 MED ORDER — POTASSIUM PHOSPHATES 15 MMOLE/5ML IV SOLN
20.0000 mmol | Freq: Once | INTRAVENOUS | Status: AC
  Administered 2019-12-29: 20 mmol via INTRAVENOUS
  Filled 2019-12-29: qty 6.67

## 2019-12-29 MED ORDER — SODIUM CHLORIDE 0.9% FLUSH: 10.0000 mL | INTRAVENOUS | Status: DC | PRN

## 2019-12-29 NOTE — Progress Notes (Signed)
Wedding ring returned to wife [place].
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
## 2019-12-30 ENCOUNTER — Encounter (HOSPITAL_COMMUNITY): Payer: Self-pay | Admitting: Orthopedic Surgery

## 2019-12-30 ENCOUNTER — Inpatient Hospital Stay (HOSPITAL_COMMUNITY): Payer: 59

## 2019-12-30 DIAGNOSIS — I4819 Other persistent atrial fibrillation: Secondary | ICD-10-CM

## 2019-12-30 DIAGNOSIS — E559 Vitamin D deficiency, unspecified: Secondary | ICD-10-CM | POA: Diagnosis present

## 2019-12-30 HISTORY — DX: Vitamin D deficiency, unspecified: E55.9

## 2019-12-30 LAB — BASIC METABOLIC PANEL
Anion gap: 8 (ref 5–15)
BUN: 24 mg/dL — ABNORMAL HIGH (ref 8–23)
CO2: 28 mmol/L (ref 22–32)
Calcium: 9.5 mg/dL (ref 8.9–10.3)
Chloride: 106 mmol/L (ref 98–111)
Creatinine, Ser: 0.68 mg/dL (ref 0.61–1.24)
GFR calc Af Amer: >60 mL/min (ref >60)
GFR calc non Af Amer: >60 mL/min (ref >60)
Glucose, Bld: 106 mg/dL — ABNORMAL HIGH (ref 70–99)
Potassium: 3.6 mmol/L (ref 3.5–5.1)
Sodium: 142 mmol/L (ref 135–145)

## 2019-12-30 LAB — LIPID PANEL
Cholesterol: 130 mg/dL (ref 0–200)
HDL: 32 mg/dL — ABNORMAL LOW (ref >40)
LDL Cholesterol: 66 mg/dL (ref 0–99)
Total CHOL/HDL Ratio: 4.1 RATIO
Triglycerides: 158 mg/dL — ABNORMAL HIGH (ref <150)
VLDL: 32 mg/dL (ref 0–40)

## 2019-12-30 LAB — POCT I-STAT 7, (LYTES, BLD GAS, ICA,H+H)
Acid-Base Excess: 4 mmol/L — ABNORMAL HIGH (ref 0.0–2.0)
Bicarbonate: 27.5 mmol/L (ref 20.0–28.0)
Calcium, Ion: 1.32 mmol/L (ref 1.15–1.40)
HCT: 40 % (ref 39.0–52.0)
Hemoglobin: 13.6 g/dL (ref 13.0–17.0)
O2 Saturation: 96 %
Patient temperature: 99.7
Potassium: 3.5 mmol/L (ref 3.5–5.1)
Sodium: 143 mmol/L (ref 135–145)
TCO2: 29 mmol/L (ref 22–32)
pCO2 arterial: 37.1 mmHg (ref 32.0–48.0)
pH, Arterial: 7.48 — ABNORMAL HIGH (ref 7.350–7.450)
pO2, Arterial: 80 mmHg — ABNORMAL LOW (ref 83.0–108.0)

## 2019-12-30 LAB — GLUCOSE, CAPILLARY
Glucose-Capillary: 109 mg/dL — ABNORMAL HIGH (ref 70–99)
Glucose-Capillary: 116 mg/dL — ABNORMAL HIGH (ref 70–99)
Glucose-Capillary: 117 mg/dL — ABNORMAL HIGH (ref 70–99)
Glucose-Capillary: 117 mg/dL — ABNORMAL HIGH (ref 70–99)
Glucose-Capillary: 129 mg/dL — ABNORMAL HIGH (ref 70–99)
Glucose-Capillary: 130 mg/dL — ABNORMAL HIGH (ref 70–99)

## 2019-12-30 LAB — CBC
HCT: 43.2 % (ref 39.0–52.0)
Hemoglobin: 13.4 g/dL (ref 13.0–17.0)
MCH: 32.4 pg (ref 26.0–34.0)
MCHC: 31 g/dL (ref 30.0–36.0)
MCV: 104.3 fL — ABNORMAL HIGH (ref 80.0–100.0)
Platelets: 121 10*3/uL — ABNORMAL LOW (ref 150–400)
RBC: 4.14 MIL/uL — ABNORMAL LOW (ref 4.22–5.81)
RDW: 13.7 % (ref 11.5–15.5)
WBC: 8.6 10*3/uL (ref 4.0–10.5)
nRBC: 0.2 % (ref 0.0–0.2)

## 2019-12-30 LAB — HEMOGLOBIN A1C
Hgb A1c MFr Bld: 5.6 % (ref 4.8–5.6)
Mean Plasma Glucose: 114.02 mg/dL

## 2019-12-30 LAB — PHOSPHORUS: Phosphorus: 2.5 mg/dL (ref 2.5–4.6)

## 2019-12-30 LAB — MAGNESIUM: Magnesium: 2.4 mg/dL (ref 1.7–2.4)

## 2019-12-30 LAB — TSH: TSH: 3.513 u[IU]/mL (ref 0.350–4.500)

## 2019-12-30 LAB — HEPARIN LEVEL (UNFRACTIONATED)
Heparin Unfractionated: 0.19 [IU]/mL — ABNORMAL LOW (ref 0.30–0.70)
Heparin Unfractionated: 0.28 [IU]/mL — ABNORMAL LOW (ref 0.30–0.70)
Heparin Unfractionated: 0.3 [IU]/mL (ref 0.30–0.70)

## 2019-12-30 LAB — TRIGLYCERIDES: Triglycerides: 139 mg/dL (ref <150)

## 2019-12-30 MED ORDER — LORAZEPAM 2 MG/ML IJ SOLN
1.0000 mg | INTRAMUSCULAR | Status: AC | PRN
  Administered 2019-12-30 – 2019-12-31 (×2): 2 mg via INTRAVENOUS
  Administered 2019-12-31: 4 mg via INTRAVENOUS
  Administered 2020-01-01: 2 mg via INTRAVENOUS
  Administered 2020-01-01: 4 mg via INTRAVENOUS
  Administered 2020-01-01 – 2020-01-02 (×2): 2 mg via INTRAVENOUS
  Administered 2020-01-02 (×2): 4 mg via INTRAVENOUS
  Administered 2020-01-02: 2 mg via INTRAVENOUS
  Filled 2019-12-30 (×2): qty 1
  Filled 2019-12-30 (×4): qty 2
  Filled 2019-12-30 (×4): qty 1

## 2019-12-30 MED ORDER — LORAZEPAM 1 MG PO TABS: 1.0000 mg | ORAL_TABLET | ORAL | Status: AC | PRN

## 2019-12-30 MED ORDER — FUROSEMIDE 10 MG/ML IJ SOLN
20.0000 mg | Freq: Once | INTRAMUSCULAR | Status: AC
  Administered 2019-12-30: 20 mg via INTRAVENOUS
  Filled 2019-12-30: qty 2

## 2019-12-30 MED ORDER — POTASSIUM PHOSPHATES 15 MMOLE/5ML IV SOLN
30.0000 mmol | Freq: Once | INTRAVENOUS | Status: AC
  Administered 2019-12-30: 30 mmol via INTRAVENOUS
  Filled 2019-12-30: qty 10
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
## 2019-12-31 DIAGNOSIS — I480 Paroxysmal atrial fibrillation: Secondary | ICD-10-CM | POA: Diagnosis not present

## 2019-12-31 LAB — CBC
HCT: 44.8 % (ref 39.0–52.0)
Hemoglobin: 14.1 g/dL (ref 13.0–17.0)
MCH: 32.5 pg (ref 26.0–34.0)
MCHC: 31.5 g/dL (ref 30.0–36.0)
MCV: 103.2 fL — ABNORMAL HIGH (ref 80.0–100.0)
Platelets: 130 10*3/uL — ABNORMAL LOW (ref 150–400)
RBC: 4.34 MIL/uL (ref 4.22–5.81)
RDW: 13.5 % (ref 11.5–15.5)
WBC: 9 10*3/uL (ref 4.0–10.5)
nRBC: 0.2 % (ref 0.0–0.2)

## 2019-12-31 LAB — BASIC METABOLIC PANEL
Anion gap: 10 (ref 5–15)
BUN: 22 mg/dL (ref 8–23)
CO2: 26 mmol/L (ref 22–32)
Calcium: 9.5 mg/dL (ref 8.9–10.3)
Chloride: 107 mmol/L (ref 98–111)
Creatinine, Ser: 0.76 mg/dL (ref 0.61–1.24)
GFR calc Af Amer: >60 mL/min (ref >60)
GFR calc non Af Amer: >60 mL/min (ref >60)
Glucose, Bld: 114 mg/dL — ABNORMAL HIGH (ref 70–99)
Potassium: 3.3 mmol/L — ABNORMAL LOW (ref 3.5–5.1)
Sodium: 143 mmol/L (ref 135–145)

## 2019-12-31 LAB — GLUCOSE, CAPILLARY
Glucose-Capillary: 117 mg/dL — ABNORMAL HIGH (ref 70–99)
Glucose-Capillary: 113 mg/dL — ABNORMAL HIGH (ref 70–99)
Glucose-Capillary: 100 mg/dL — ABNORMAL HIGH (ref 70–99)
Glucose-Capillary: 119 mg/dL — ABNORMAL HIGH (ref 70–99)
Glucose-Capillary: 131 mg/dL — ABNORMAL HIGH (ref 70–99)
Glucose-Capillary: 125 mg/dL — ABNORMAL HIGH (ref 70–99)

## 2019-12-31 LAB — HEPARIN LEVEL (UNFRACTIONATED)
Heparin Unfractionated: 0.15 [IU]/mL — ABNORMAL LOW (ref 0.30–0.70)
Heparin Unfractionated: 0.29 [IU]/mL — ABNORMAL LOW (ref 0.30–0.70)

## 2019-12-31 LAB — PHOSPHORUS: Phosphorus: 3 mg/dL (ref 2.5–4.6)

## 2019-12-31 LAB — MAGNESIUM: Magnesium: 2.2 mg/dL (ref 1.7–2.4)

## 2019-12-31 MED ORDER — FUROSEMIDE 10 MG/ML IJ SOLN
20.0000 mg | Freq: Once | INTRAMUSCULAR | Status: AC
  Administered 2019-12-31: 20 mg via INTRAVENOUS
  Filled 2019-12-31: qty 2

## 2019-12-31 MED ORDER — POTASSIUM CHLORIDE 20 MEQ/15ML (10%) PO SOLN
40.0000 meq | ORAL | Status: AC
  Administered 2019-12-31 (×3): 40 meq via ORAL
  Filled 2019-12-31 (×3): qty 30
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
## 2020-01-01 ENCOUNTER — Inpatient Hospital Stay (HOSPITAL_COMMUNITY): Payer: 59

## 2020-01-01 DIAGNOSIS — I480 Paroxysmal atrial fibrillation: Secondary | ICD-10-CM | POA: Diagnosis not present

## 2020-01-01 LAB — CBC
HCT: 45.1 % (ref 39.0–52.0)
Hemoglobin: 14.1 g/dL (ref 13.0–17.0)
MCH: 32.4 pg (ref 26.0–34.0)
MCHC: 31.3 g/dL (ref 30.0–36.0)
MCV: 103.7 fL — ABNORMAL HIGH (ref 80.0–100.0)
Platelets: 132 10*3/uL — ABNORMAL LOW (ref 150–400)
RBC: 4.35 MIL/uL (ref 4.22–5.81)
RDW: 13.3 % (ref 11.5–15.5)
WBC: 9.1 10*3/uL (ref 4.0–10.5)
nRBC: 0 % (ref 0.0–0.2)

## 2020-01-01 LAB — GLUCOSE, CAPILLARY
Glucose-Capillary: 133 mg/dL — ABNORMAL HIGH (ref 70–99)
Glucose-Capillary: 123 mg/dL — ABNORMAL HIGH (ref 70–99)
Glucose-Capillary: 133 mg/dL — ABNORMAL HIGH (ref 70–99)
Glucose-Capillary: 140 mg/dL — ABNORMAL HIGH (ref 70–99)
Glucose-Capillary: 147 mg/dL — ABNORMAL HIGH (ref 70–99)
Glucose-Capillary: 119 mg/dL — ABNORMAL HIGH (ref 70–99)

## 2020-01-01 LAB — BASIC METABOLIC PANEL
Anion gap: 10 (ref 5–15)
BUN: 23 mg/dL (ref 8–23)
CO2: 24 mmol/L (ref 22–32)
Calcium: 9.6 mg/dL (ref 8.9–10.3)
Chloride: 111 mmol/L (ref 98–111)
Creatinine, Ser: 0.85 mg/dL (ref 0.61–1.24)
GFR calc Af Amer: >60 mL/min (ref >60)
GFR calc non Af Amer: >60 mL/min (ref >60)
Glucose, Bld: 135 mg/dL — ABNORMAL HIGH (ref 70–99)
Potassium: 3.7 mmol/L (ref 3.5–5.1)
Sodium: 145 mmol/L (ref 135–145)

## 2020-01-01 LAB — PHOSPHORUS: Phosphorus: 3.6 mg/dL (ref 2.5–4.6)

## 2020-01-01 LAB — HEPARIN LEVEL (UNFRACTIONATED)
Heparin Unfractionated: 0.24 [IU]/mL — ABNORMAL LOW (ref 0.30–0.70)
Heparin Unfractionated: 0.55 [IU]/mL (ref 0.30–0.70)

## 2020-01-01 LAB — MAGNESIUM: Magnesium: 2.1 mg/dL (ref 1.7–2.4)

## 2020-01-01 MED ORDER — AMIODARONE HCL IN DEXTROSE 360-4.14 MG/200ML-% IV SOLN
30.0000 mg/h | INTRAVENOUS | Status: DC
  Administered 2020-01-01 – 2020-01-05 (×7): 30 mg/h via INTRAVENOUS
  Filled 2020-01-01 (×8): qty 200

## 2020-01-01 MED ORDER — AMIODARONE HCL IN DEXTROSE 360-4.14 MG/200ML-% IV SOLN
60.0000 mg/h | INTRAVENOUS | Status: DC
  Administered 2020-01-01: 60 mg/h via INTRAVENOUS

## 2020-01-01 MED ORDER — AMIODARONE IV BOLUS ONLY 150 MG/100ML
150.0000 mg | Freq: Once | INTRAVENOUS | Status: AC
  Administered 2020-01-01: 150 mg via INTRAVENOUS
  Filled 2020-01-01: qty 100

## 2020-01-01 MED ORDER — ORAL CARE MOUTH RINSE
15.0000 mL | Freq: Two times a day (BID) | OROMUCOSAL | Status: DC
  Administered 2020-01-02 – 2020-01-19 (×35): 15 mL via OROMUCOSAL

## 2020-01-01 MED ORDER — DEXTROSE 5 % IV SOLN: 5.0000 ug/kg/min | INTRAVENOUS | Status: DC

## 2020-01-01 MED ORDER — POTASSIUM CHLORIDE 20 MEQ/15ML (10%) PO SOLN
40.0000 meq | Freq: Once | ORAL | Status: AC
  Administered 2020-01-01: 40 meq
  Filled 2020-01-01: qty 30

## 2020-01-01 MED ORDER — CHLORHEXIDINE GLUCONATE 0.12 % MT SOLN
15.0000 mL | Freq: Two times a day (BID) | OROMUCOSAL | Status: DC
  Administered 2020-01-02 – 2020-01-19 (×35): 15 mL via OROMUCOSAL
  Filled 2020-01-01 (×16): qty 15

## 2020-01-01 NOTE — Procedures (Signed)
Extubation Procedure Note  Patient Details:   Name: Roberto Lynch DOB: 07/17/1955 MRN: [id number]   Airway Documentation:    Vent end date: 01/01/20 Vent end time: 0908   Evaluation  O2 sats: currently acceptable Complications: Complications of Afib w/ RVR. Patient did tolerate procedure well. Bilateral Breath Sounds: Clear, Diminished   Yes   Pt extubated per Dr. Marvetta Gibbons order to 15L HFNC. Positive cuff leak.  Dewain Penning T 01/01/2020, 9:15 AM

## 2020-01-01 NOTE — Progress Notes (Signed)
Pt assessed for extubation per MD orders. During RRT's assessment pt's HR is between 132-178. SATs maintaining between 88-90% on 40% FIO2 and 5 PEEP. Copious, thick and tan secretions removed with levage and suction to help pulmonary toilet pt and increase SATs without having to increase FIO2. Amiodarone bolus started by RN.   Will update MD.  Dr. Bedelia Person updated and would like to proceed with extubation after amiodarone is given and lower pt's SAT goal to 88%.

## 2020-01-01 NOTE — Progress Notes (Signed)
 Fentanyl wasted with Vance Peper, RN

## 2020-01-01 NOTE — Progress Notes (Addendum)
ANTICOAGULATION CONSULT NOTE  Pharmacy Consult for heparin Indication: atrial fibrillation  Allergies  Allergen Reactions  . Bee Venom     Patient Measurements: Height: 5\' 9"  (175.3 cm) Weight: 125.7 kg (277 lb 1.9 oz) IBW/kg (Calculated) : 70.7 Heparin Dosing Weight: ~ 100 kg  Vital Signs: Temp: 100.2 F (37.9 C) (09/12 0400) Temp Source: Oral (09/12 0400) BP: 105/73 (09/12 0400) Pulse Rate: 106 (09/12 0400)  Labs: Recent Labs    12/30/19 0340 12/30/19 0340 12/30/19 0701 12/30/19 0916 12/30/19 1710 12/31/19 0405 12/31/19 0900 12/31/19 1735 01/01/20 0358  HGB 13.4   < > 13.6   < >  --  14.1  --   --  14.1  HCT 43.2   < > 40.0  --   --  44.8  --   --  45.1  PLT 121*  --   --   --   --  130*  --   --  132*  HEPARINUNFRC  --   --   --    < >   < >  --  0.15* 0.29* 0.24*  CREATININE 0.68  --   --   --   --  0.76  --   --   --    < > = values in this interval not displayed.    Estimated Creatinine Clearance: 125.5 mL/min (by C-G formula based on SCr of 0.76 mg/dL).  Assessment: 64 y.o. male with Afib for heparin   Heparin level 0.29.  Goal of Therapy:  Heparin level 0.3-0.5 - will target lower end of range given recent surgery Monitor platelets by anticoagulation protocol: Yes   Plan:  Increase Heparin 2800 units/hr  68, PharmD, BCPS  01/01/2020

## 2020-01-01 NOTE — Progress Notes (Signed)
ANTICOAGULATION CONSULT NOTE - Follow Up Consult  Pharmacy Consult for Heparin Indication: atrial fibrillation  Allergies  Allergen Reactions  . Bee Venom     Patient Measurements: Height: 5\' 9"  (175.3 cm) Weight: 125.7 kg (277 lb 1.9 oz) IBW/kg (Calculated) : 70.7 Heparin Dosing Weight: 99.3kg  Vital Signs: Temp: 100.2 F (37.9 C) (09/12 0400) Temp Source: Oral (09/12 0400) BP: 110/81 (09/12 1100) Pulse Rate: 88 (09/12 1200)  Labs: Recent Labs    12/30/19 0340 12/30/19 0340 12/30/19 0701 12/30/19 0916 12/31/19 0405 12/31/19 0900 12/31/19 1735 01/01/20 0358 01/01/20 0840 01/01/20 1210  HGB 13.4   < > 13.6   < > 14.1  --   --  14.1  --   --   HCT 43.2   < > 40.0  --  44.8  --   --  45.1  --   --   PLT 121*  --   --   --  130*  --   --  132*  --   --   HEPARINUNFRC  --   --   --    < >  --    < > 0.29* 0.24*  --  0.55  CREATININE 0.68  --   --   --  0.76  --   --   --  0.85  --    < > = values in this interval not displayed.    Estimated Creatinine Clearance: 118.1 mL/min (by C-G formula based on SCr of 0.85 mg/dL).   Assessment: Anticoag: Lovenox 40 BID > IV heparin for Afib . Hgb WNL. Plts low 132 but rising. Hgb WNL. AM HL 0.24 still low. Hep level 0.55 in goal after rate increase 9/12 AM. - CHADSVASc 2,    Goal of Therapy:  Heparin level 0.3-0.7 units/ml Monitor platelets by anticoagulation protocol: Yes   Plan:  Con't heparin at 2800 units/hr.No bolus  Daily HL and CBC   Kelton Bultman S. 11/12, PharmD, BCPS Clinical Staff Pharmacist [email or web address] Merilynn Finland, Karmina Zufall Stillinger 01/01/2020,1:11 PM
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
## 2020-01-02 DIAGNOSIS — I4819 Other persistent atrial fibrillation: Secondary | ICD-10-CM | POA: Diagnosis not present

## 2020-01-02 LAB — BASIC METABOLIC PANEL
Anion gap: 11 (ref 5–15)
BUN: 24 mg/dL — ABNORMAL HIGH (ref 8–23)
CO2: 24 mmol/L (ref 22–32)
Calcium: 9.3 mg/dL (ref 8.9–10.3)
Chloride: 112 mmol/L — ABNORMAL HIGH (ref 98–111)
Creatinine, Ser: 0.66 mg/dL (ref 0.61–1.24)
GFR calc Af Amer: >60 mL/min (ref >60)
GFR calc non Af Amer: >60 mL/min (ref >60)
Glucose, Bld: 115 mg/dL — ABNORMAL HIGH (ref 70–99)
Potassium: 4.3 mmol/L (ref 3.5–5.1)
Sodium: 147 mmol/L — ABNORMAL HIGH (ref 135–145)

## 2020-01-02 LAB — CBC
HCT: 45.4 % (ref 39.0–52.0)
Hemoglobin: 13.8 g/dL (ref 13.0–17.0)
MCH: 32.6 pg (ref 26.0–34.0)
MCHC: 30.4 g/dL (ref 30.0–36.0)
MCV: 107.3 fL — ABNORMAL HIGH (ref 80.0–100.0)
Platelets: 129 10*3/uL — ABNORMAL LOW (ref 150–400)
RBC: 4.23 MIL/uL (ref 4.22–5.81)
RDW: 13.4 % (ref 11.5–15.5)
WBC: 9.7 10*3/uL (ref 4.0–10.5)
nRBC: 0 % (ref 0.0–0.2)

## 2020-01-02 LAB — GLUCOSE, CAPILLARY
Glucose-Capillary: 117 mg/dL — ABNORMAL HIGH (ref 70–99)
Glucose-Capillary: 109 mg/dL — ABNORMAL HIGH (ref 70–99)
Glucose-Capillary: 124 mg/dL — ABNORMAL HIGH (ref 70–99)
Glucose-Capillary: 117 mg/dL — ABNORMAL HIGH (ref 70–99)
Glucose-Capillary: 141 mg/dL — ABNORMAL HIGH (ref 70–99)
Glucose-Capillary: 128 mg/dL — ABNORMAL HIGH (ref 70–99)

## 2020-01-02 LAB — HEPARIN LEVEL (UNFRACTIONATED): Heparin Unfractionated: 0.69 [IU]/mL (ref 0.30–0.70)

## 2020-01-02 MED ORDER — POTASSIUM CHLORIDE 10 MEQ/50ML IV SOLN
10.0000 meq | INTRAVENOUS | Status: AC
  Administered 2020-01-02 (×2): 10 meq via INTRAVENOUS
  Filled 2020-01-02 (×2): qty 50

## 2020-01-02 MED ORDER — FUROSEMIDE 10 MG/ML IJ SOLN
40.0000 mg | Freq: Two times a day (BID) | INTRAMUSCULAR | Status: AC
  Administered 2020-01-02 (×2): 40 mg via INTRAVENOUS
  Filled 2020-01-02 (×2): qty 4

## 2020-01-02 MED ORDER — SODIUM CHLORIDE 0.9 % IV SOLN: INTRAVENOUS | Status: DC

## 2020-01-02 MED ORDER — DEXMEDETOMIDINE HCL IN NACL 400 MCG/100ML IV SOLN
0.0000 ug/kg/h | INTRAVENOUS | Status: AC
  Administered 2020-01-02: 0.6 ug/kg/h via INTRAVENOUS
  Administered 2020-01-02: 0.4 ug/kg/h via INTRAVENOUS
  Administered 2020-01-03 (×2): 1.2 ug/kg/h via INTRAVENOUS
  Administered 2020-01-03: 0.8 ug/kg/h via INTRAVENOUS
  Administered 2020-01-03: 1 ug/kg/h via INTRAVENOUS
  Administered 2020-01-03: 1.2 ug/kg/h via INTRAVENOUS
  Administered 2020-01-03: 0.8 ug/kg/h via INTRAVENOUS
  Administered 2020-01-04: 0.6 ug/kg/h via INTRAVENOUS
  Administered 2020-01-04 (×3): 1.2 ug/kg/h via INTRAVENOUS
  Administered 2020-01-05: 0.6 ug/kg/h via INTRAVENOUS
  Filled 2020-01-02 (×5): qty 100
  Filled 2020-01-02 (×2): qty 200
  Filled 2020-01-02 (×8): qty 100

## 2020-01-02 MED ORDER — SODIUM CHLORIDE 0.9 % IV SOLN
INTRAVENOUS | Status: DC | PRN
  Administered 2020-01-02: 500 mL via INTRAVENOUS
  Administered 2020-01-05: 250 mL via INTRAVENOUS

## 2020-01-02 MED ORDER — BISACODYL 10 MG RE SUPP
10.0000 mg | Freq: Once | RECTAL | Status: AC
  Administered 2020-01-02: 10 mg via RECTAL
  Filled 2020-01-02: qty 1

## 2020-01-02 NOTE — Progress Notes (Addendum)
This RN noticed repeated systolic BP in the 80s. Precedex only at 0.1. Patient not on any maintenance fluids at this time. Patient received 40 mg lasix x2 during the day and has been putting out urine all day. Trauma, MD paged at this time for possible order for fluids.   1055: CVP 17 through PICC but patient agitated and restless. MD notified. Order to start 55ml/hr NS maintenance fluid. Will continue to monitor patient.
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
## 2020-01-03 ENCOUNTER — Inpatient Hospital Stay (HOSPITAL_COMMUNITY): Payer: 59

## 2020-01-03 DIAGNOSIS — I4819 Other persistent atrial fibrillation: Secondary | ICD-10-CM | POA: Diagnosis not present

## 2020-01-03 LAB — CBC
HCT: 44.7 % (ref 39.0–52.0)
Hemoglobin: 13.9 g/dL (ref 13.0–17.0)
MCH: 33.3 pg (ref 26.0–34.0)
MCHC: 31.1 g/dL (ref 30.0–36.0)
MCV: 107.2 fL — ABNORMAL HIGH (ref 80.0–100.0)
Platelets: 138 10*3/uL — ABNORMAL LOW (ref 150–400)
RBC: 4.17 MIL/uL — ABNORMAL LOW (ref 4.22–5.81)
RDW: 13.2 % (ref 11.5–15.5)
WBC: 9.3 10*3/uL (ref 4.0–10.5)
nRBC: 0 % (ref 0.0–0.2)

## 2020-01-03 LAB — HEPARIN LEVEL (UNFRACTIONATED): Heparin Unfractionated: 0.52 [IU]/mL (ref 0.30–0.70)

## 2020-01-03 LAB — BASIC METABOLIC PANEL
Anion gap: 9 (ref 5–15)
BUN: 29 mg/dL — ABNORMAL HIGH (ref 8–23)
CO2: 29 mmol/L (ref 22–32)
Calcium: 9.8 mg/dL (ref 8.9–10.3)
Chloride: 110 mmol/L (ref 98–111)
Creatinine, Ser: 0.83 mg/dL (ref 0.61–1.24)
GFR calc Af Amer: >60 mL/min (ref >60)
GFR calc non Af Amer: >60 mL/min (ref >60)
Glucose, Bld: 150 mg/dL — ABNORMAL HIGH (ref 70–99)
Potassium: 4.4 mmol/L (ref 3.5–5.1)
Sodium: 148 mmol/L — ABNORMAL HIGH (ref 135–145)

## 2020-01-03 LAB — GLUCOSE, CAPILLARY
Glucose-Capillary: 140 mg/dL — ABNORMAL HIGH (ref 70–99)
Glucose-Capillary: 140 mg/dL — ABNORMAL HIGH (ref 70–99)
Glucose-Capillary: 135 mg/dL — ABNORMAL HIGH (ref 70–99)
Glucose-Capillary: 147 mg/dL — ABNORMAL HIGH (ref 70–99)
Glucose-Capillary: 110 mg/dL — ABNORMAL HIGH (ref 70–99)
Glucose-Capillary: 141 mg/dL — ABNORMAL HIGH (ref 70–99)

## 2020-01-03 MED ORDER — LORAZEPAM 2 MG/ML IJ SOLN
1.0000 mg | INTRAMUSCULAR | Status: AC | PRN
  Administered 2020-01-03: 2 mg via INTRAVENOUS
  Administered 2020-01-04: 4 mg via INTRAVENOUS
  Administered 2020-01-04 (×2): 2 mg via INTRAVENOUS
  Administered 2020-01-05: 3 mg via INTRAVENOUS
  Administered 2020-01-05: 4 mg via INTRAVENOUS
  Administered 2020-01-05: 2 mg via INTRAVENOUS
  Administered 2020-01-05: 4 mg via INTRAVENOUS
  Administered 2020-01-06: 3 mg via INTRAVENOUS
  Filled 2020-01-03: qty 1
  Filled 2020-01-03 (×3): qty 2
  Filled 2020-01-03 (×2): qty 1
  Filled 2020-01-03 (×2): qty 2
  Filled 2020-01-03: qty 1
  Filled 2020-01-03: qty 2

## 2020-01-03 MED ORDER — IPRATROPIUM-ALBUTEROL 0.5-2.5 (3) MG/3ML IN SOLN
3.0000 mL | Freq: Four times a day (QID) | RESPIRATORY_TRACT | Status: DC
  Administered 2020-01-03 – 2020-01-06 (×10): 3 mL via RESPIRATORY_TRACT
  Filled 2020-01-03 (×12): qty 3

## 2020-01-03 MED ORDER — FUROSEMIDE 10 MG/ML IJ SOLN
20.0000 mg | Freq: Once | INTRAMUSCULAR | Status: AC
  Administered 2020-01-03: 20 mg via INTRAVENOUS
  Filled 2020-01-03: qty 2

## 2020-01-03 MED ORDER — LORAZEPAM 1 MG PO TABS: 1.0000 mg | ORAL_TABLET | ORAL | Status: AC | PRN

## 2020-01-03 MED ORDER — PROSOURCE TF PO LIQD
90.0000 mL | Freq: Two times a day (BID) | ORAL | Status: DC
  Administered 2020-01-03 – 2020-01-17 (×28): 90 mL
  Administered 2020-01-17: 45 mL
  Administered 2020-01-18 – 2020-01-27 (×17): 90 mL
  Filled 2020-01-03 (×46): qty 90

## 2020-01-03 MED ORDER — OSMOLITE 1.5 CAL PO LIQD
1000.0000 mL | ORAL | Status: DC
  Administered 2020-01-03 – 2020-01-20 (×15): 1000 mL
  Filled 2020-01-03 (×18): qty 1000

## 2020-01-03 MED FILL — Fentanyl Citrate-NaCl 0.9% IV Soln 2.5 MG/250ML: INTRAVENOUS | Qty: 250 | Status: AC
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
## 2020-01-03 NOTE — Progress Notes (Signed)
Inpatient Rehab Admissions Coordinator Note:   Per PT recommendations, pt was screened for CIR candidacy by Estill Dooms, PT, DPT.  Note eval limited by low O2 sats and lethargy.  Will continue to follow at a distance before screening for appropriateness for CIR.  Please contact me with questions.   Estill Dooms, PT, DPT [phone] 01/03/20 1:07 PM
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
## 2020-01-04 LAB — CBC
HCT: 45.4 % (ref 39.0–52.0)
Hemoglobin: 13.9 g/dL (ref 13.0–17.0)
MCH: 32.2 pg (ref 26.0–34.0)
MCHC: 30.6 g/dL (ref 30.0–36.0)
MCV: 105.1 fL — ABNORMAL HIGH (ref 80.0–100.0)
Platelets: 143 10*3/uL — ABNORMAL LOW (ref 150–400)
RBC: 4.32 MIL/uL (ref 4.22–5.81)
RDW: 13 % (ref 11.5–15.5)
WBC: 9.2 10*3/uL (ref 4.0–10.5)
nRBC: 0.2 % (ref 0.0–0.2)

## 2020-01-04 LAB — BASIC METABOLIC PANEL
Anion gap: 8 (ref 5–15)
BUN: 25 mg/dL — ABNORMAL HIGH (ref 8–23)
CO2: 29 mmol/L (ref 22–32)
Calcium: 9.9 mg/dL (ref 8.9–10.3)
Chloride: 113 mmol/L — ABNORMAL HIGH (ref 98–111)
Creatinine, Ser: 0.89 mg/dL (ref 0.61–1.24)
GFR calc Af Amer: >60 mL/min (ref >60)
GFR calc non Af Amer: >60 mL/min (ref >60)
Glucose, Bld: 158 mg/dL — ABNORMAL HIGH (ref 70–99)
Potassium: 3.8 mmol/L (ref 3.5–5.1)
Sodium: 150 mmol/L — ABNORMAL HIGH (ref 135–145)

## 2020-01-04 LAB — GLUCOSE, CAPILLARY
Glucose-Capillary: 163 mg/dL — ABNORMAL HIGH (ref 70–99)
Glucose-Capillary: 154 mg/dL — ABNORMAL HIGH (ref 70–99)
Glucose-Capillary: 176 mg/dL — ABNORMAL HIGH (ref 70–99)
Glucose-Capillary: 120 mg/dL — ABNORMAL HIGH (ref 70–99)
Glucose-Capillary: 112 mg/dL — ABNORMAL HIGH (ref 70–99)
Glucose-Capillary: 129 mg/dL — ABNORMAL HIGH (ref 70–99)

## 2020-01-04 LAB — HEPARIN LEVEL (UNFRACTIONATED)
Heparin Unfractionated: 0.75 [IU]/mL — ABNORMAL HIGH (ref 0.30–0.70)
Heparin Unfractionated: 0.83 [IU]/mL — ABNORMAL HIGH (ref 0.30–0.70)
Heparin Unfractionated: 0.57 [IU]/mL (ref 0.30–0.70)

## 2020-01-04 MED ORDER — CLONAZEPAM 0.5 MG PO TABS
0.5000 mg | ORAL_TABLET | Freq: Three times a day (TID) | ORAL | Status: DC
  Administered 2020-01-04 – 2020-01-05 (×4): 0.5 mg via ORAL
  Filled 2020-01-04 (×4): qty 1

## 2020-01-04 MED ORDER — SODIUM CHLORIDE 0.9 % IV SOLN
2.0000 g | INTRAVENOUS | Status: AC
  Administered 2020-01-05 – 2020-01-08 (×4): 2 g via INTRAVENOUS
  Filled 2020-01-04 (×3): qty 2
  Filled 2020-01-04: qty 20

## 2020-01-04 MED ORDER — FUROSEMIDE 10 MG/ML IJ SOLN
20.0000 mg | Freq: Two times a day (BID) | INTRAMUSCULAR | Status: AC
  Administered 2020-01-04 (×2): 20 mg via INTRAVENOUS
  Filled 2020-01-04 (×2): qty 2

## 2020-01-04 MED ORDER — SODIUM CHLORIDE 0.9 % IV SOLN
500.0000 mg | INTRAVENOUS | Status: DC
  Administered 2020-01-04 – 2020-01-06 (×3): 500 mg via INTRAVENOUS
  Filled 2020-01-04 (×3): qty 500

## 2020-01-04 MED ORDER — POTASSIUM CHLORIDE 20 MEQ PO PACK
40.0000 meq | PACK | Freq: Once | ORAL | Status: AC
  Administered 2020-01-04: 40 meq via ORAL
  Filled 2020-01-04: qty 2

## 2020-01-04 MED ORDER — SODIUM CHLORIDE 0.9 % IV SOLN
1.0000 g | INTRAVENOUS | Status: DC
  Administered 2020-01-04: 1 g via INTRAVENOUS
  Filled 2020-01-04: qty 1

## 2020-01-04 MED ORDER — FREE WATER
200.0000 mL | Freq: Three times a day (TID) | Status: DC
  Administered 2020-01-04 – 2020-01-05 (×4): 200 mL
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
## 2020-01-04 NOTE — Plan of Care (Signed)
  Problem: Clinical Measurements: Goal: Ability to maintain clinical measurements within normal limits will improve Outcome: Progressing Goal: Will remain free from infection Outcome: Progressing   Problem: Nutrition: Goal: Adequate nutrition will be maintained Outcome: Progressing   Problem: Safety: Goal: Ability to remain free from injury will improve Outcome: Progressing   Problem: Skin Integrity: Goal: Risk for impaired skin integrity will decrease Outcome: Progressing   
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
## 2020-01-05 ENCOUNTER — Inpatient Hospital Stay (HOSPITAL_COMMUNITY): Payer: 59

## 2020-01-05 DIAGNOSIS — I4819 Other persistent atrial fibrillation: Secondary | ICD-10-CM | POA: Diagnosis not present

## 2020-01-05 LAB — CBC
HCT: 45.2 % (ref 39.0–52.0)
Hemoglobin: 13.6 g/dL (ref 13.0–17.0)
MCH: 32.2 pg (ref 26.0–34.0)
MCHC: 30.1 g/dL (ref 30.0–36.0)
MCV: 107.1 fL — ABNORMAL HIGH (ref 80.0–100.0)
Platelets: 135 10*3/uL — ABNORMAL LOW (ref 150–400)
RBC: 4.22 MIL/uL (ref 4.22–5.81)
RDW: 13.2 % (ref 11.5–15.5)
WBC: 8.1 10*3/uL (ref 4.0–10.5)
nRBC: 0 % (ref 0.0–0.2)

## 2020-01-05 LAB — HEPARIN LEVEL (UNFRACTIONATED): Heparin Unfractionated: 0.61 [IU]/mL (ref 0.30–0.70)

## 2020-01-05 LAB — BASIC METABOLIC PANEL
Anion gap: 7 (ref 5–15)
BUN: 24 mg/dL — ABNORMAL HIGH (ref 8–23)
CO2: 33 mmol/L — ABNORMAL HIGH (ref 22–32)
Calcium: 10 mg/dL (ref 8.9–10.3)
Chloride: 110 mmol/L (ref 98–111)
Creatinine, Ser: 0.82 mg/dL (ref 0.61–1.24)
GFR calc Af Amer: >60 mL/min (ref >60)
GFR calc non Af Amer: >60 mL/min (ref >60)
Glucose, Bld: 140 mg/dL — ABNORMAL HIGH (ref 70–99)
Potassium: 4.1 mmol/L (ref 3.5–5.1)
Sodium: 150 mmol/L — ABNORMAL HIGH (ref 135–145)

## 2020-01-05 LAB — GLUCOSE, CAPILLARY
Glucose-Capillary: 141 mg/dL — ABNORMAL HIGH (ref 70–99)
Glucose-Capillary: 132 mg/dL — ABNORMAL HIGH (ref 70–99)
Glucose-Capillary: 124 mg/dL — ABNORMAL HIGH (ref 70–99)
Glucose-Capillary: 129 mg/dL — ABNORMAL HIGH (ref 70–99)
Glucose-Capillary: 108 mg/dL — ABNORMAL HIGH (ref 70–99)
Glucose-Capillary: 112 mg/dL — ABNORMAL HIGH (ref 70–99)

## 2020-01-05 LAB — MAGNESIUM: Magnesium: 2.3 mg/dL (ref 1.7–2.4)

## 2020-01-05 MED ORDER — POTASSIUM CHLORIDE 10 MEQ/100ML IV SOLN
10.0000 meq | INTRAVENOUS | Status: AC
  Administered 2020-01-05 (×2): 10 meq via INTRAVENOUS
  Filled 2020-01-05 (×2): qty 100

## 2020-01-05 MED ORDER — FREE WATER
200.0000 mL | Freq: Four times a day (QID) | Status: DC
  Administered 2020-01-05 – 2020-01-07 (×8): 200 mL

## 2020-01-05 MED ORDER — AMIODARONE HCL 200 MG PO TABS
200.0000 mg | ORAL_TABLET | Freq: Every day | ORAL | Status: DC
  Administered 2020-01-05: 200 mg via ORAL
  Filled 2020-01-05: qty 1

## 2020-01-05 MED ORDER — FUROSEMIDE 10 MG/ML IJ SOLN
20.0000 mg | Freq: Once | INTRAMUSCULAR | Status: AC
  Administered 2020-01-05: 20 mg via INTRAVENOUS
  Filled 2020-01-05: qty 2

## 2020-01-05 MED ORDER — CLONAZEPAM 0.5 MG PO TABS
0.5000 mg | ORAL_TABLET | Freq: Three times a day (TID) | ORAL | Status: DC
  Administered 2020-01-05 – 2020-01-10 (×17): 0.5 mg
  Filled 2020-01-05 (×17): qty 1

## 2020-01-05 MED ORDER — APIXABAN 5 MG PO TABS
5.0000 mg | ORAL_TABLET | Freq: Two times a day (BID) | ORAL | Status: AC
  Administered 2020-01-05 – 2020-01-20 (×31): 5 mg
  Filled 2020-01-05 (×31): qty 1

## 2020-01-05 MED ORDER — APIXABAN 5 MG PO TABS
5.0000 mg | ORAL_TABLET | Freq: Two times a day (BID) | ORAL | Status: DC
  Administered 2020-01-05: 5 mg via ORAL
  Filled 2020-01-05: qty 1

## 2020-01-05 MED ORDER — AMIODARONE HCL 200 MG PO TABS
200.0000 mg | ORAL_TABLET | Freq: Every day | ORAL | Status: DC
  Administered 2020-01-06 – 2020-02-05 (×31): 200 mg
  Filled 2020-01-05 (×32): qty 1
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
## 2020-01-06 LAB — GLUCOSE, CAPILLARY
Glucose-Capillary: 116 mg/dL — ABNORMAL HIGH (ref 70–99)
Glucose-Capillary: 124 mg/dL — ABNORMAL HIGH (ref 70–99)
Glucose-Capillary: 110 mg/dL — ABNORMAL HIGH (ref 70–99)
Glucose-Capillary: 125 mg/dL — ABNORMAL HIGH (ref 70–99)
Glucose-Capillary: 110 mg/dL — ABNORMAL HIGH (ref 70–99)
Glucose-Capillary: 136 mg/dL — ABNORMAL HIGH (ref 70–99)

## 2020-01-06 LAB — BASIC METABOLIC PANEL
Anion gap: 4 — ABNORMAL LOW (ref 5–15)
BUN: 24 mg/dL — ABNORMAL HIGH (ref 8–23)
CO2: 33 mmol/L — ABNORMAL HIGH (ref 22–32)
Calcium: 9.4 mg/dL (ref 8.9–10.3)
Chloride: 111 mmol/L (ref 98–111)
Creatinine, Ser: 0.78 mg/dL (ref 0.61–1.24)
GFR calc Af Amer: >60 mL/min (ref >60)
GFR calc non Af Amer: >60 mL/min (ref >60)
Glucose, Bld: 113 mg/dL — ABNORMAL HIGH (ref 70–99)
Potassium: 3.5 mmol/L (ref 3.5–5.1)
Sodium: 148 mmol/L — ABNORMAL HIGH (ref 135–145)

## 2020-01-06 LAB — CULTURE, RESPIRATORY W GRAM STAIN
Culture: NORMAL
Gram Stain: NONE SEEN

## 2020-01-06 LAB — CBC
HCT: 47.5 % (ref 39.0–52.0)
Hemoglobin: 14.5 g/dL (ref 13.0–17.0)
MCH: 33 pg (ref 26.0–34.0)
MCHC: 30.5 g/dL (ref 30.0–36.0)
MCV: 108.2 fL — ABNORMAL HIGH (ref 80.0–100.0)
Platelets: 150 10*3/uL (ref 150–400)
RBC: 4.39 MIL/uL (ref 4.22–5.81)
RDW: 13 % (ref 11.5–15.5)
WBC: 8.8 10*3/uL (ref 4.0–10.5)
nRBC: 0.2 % (ref 0.0–0.2)

## 2020-01-06 MED ORDER — POTASSIUM CHLORIDE 20 MEQ PO PACK
40.0000 meq | PACK | Freq: Two times a day (BID) | ORAL | Status: AC
  Administered 2020-01-06 (×2): 40 meq
  Filled 2020-01-06 (×2): qty 2

## 2020-01-06 MED ORDER — IPRATROPIUM-ALBUTEROL 0.5-2.5 (3) MG/3ML IN SOLN
3.0000 mL | Freq: Four times a day (QID) | RESPIRATORY_TRACT | Status: DC | PRN
  Administered 2020-01-07 – 2020-02-03 (×3): 3 mL via RESPIRATORY_TRACT
  Filled 2020-01-06 (×4): qty 3

## 2020-01-06 MED ORDER — FUROSEMIDE 10 MG/ML IJ SOLN
40.0000 mg | Freq: Two times a day (BID) | INTRAMUSCULAR | Status: AC
  Administered 2020-01-06 (×2): 40 mg via INTRAVENOUS
  Filled 2020-01-06 (×2): qty 4

## 2020-01-06 MED ORDER — LORAZEPAM 1 MG PO TABS: 1.0000 mg | ORAL_TABLET | ORAL | Status: AC | PRN

## 2020-01-06 MED ORDER — LORAZEPAM 2 MG/ML IJ SOLN
1.0000 mg | INTRAMUSCULAR | Status: AC | PRN
  Administered 2020-01-06: 2 mg via INTRAVENOUS
  Administered 2020-01-06: 4 mg via INTRAVENOUS
  Administered 2020-01-06: 3 mg via INTRAVENOUS
  Administered 2020-01-06 (×2): 4 mg via INTRAVENOUS
  Administered 2020-01-07: 2 mg via INTRAVENOUS
  Administered 2020-01-07 (×3): 4 mg via INTRAVENOUS
  Administered 2020-01-07 (×2): 2 mg via INTRAVENOUS
  Administered 2020-01-08: 1 mg via INTRAVENOUS
  Administered 2020-01-08: 4 mg via INTRAVENOUS
  Administered 2020-01-08: 3 mg via INTRAVENOUS
  Administered 2020-01-08 – 2020-01-09 (×5): 2 mg via INTRAVENOUS
  Filled 2020-01-06 (×3): qty 1
  Filled 2020-01-06: qty 2
  Filled 2020-01-06 (×2): qty 1
  Filled 2020-01-06: qty 2
  Filled 2020-01-06: qty 1
  Filled 2020-01-06: qty 2
  Filled 2020-01-06: qty 1
  Filled 2020-01-06 (×2): qty 2
  Filled 2020-01-06: qty 1
  Filled 2020-01-06 (×4): qty 2
  Filled 2020-01-06: qty 1
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
## 2020-01-07 ENCOUNTER — Inpatient Hospital Stay (HOSPITAL_COMMUNITY): Payer: 59

## 2020-01-07 LAB — BASIC METABOLIC PANEL
Anion gap: 11 (ref 5–15)
BUN: 20 mg/dL (ref 8–23)
CO2: 33 mmol/L — ABNORMAL HIGH (ref 22–32)
Calcium: 10.1 mg/dL (ref 8.9–10.3)
Chloride: 110 mmol/L (ref 98–111)
Creatinine, Ser: 0.86 mg/dL (ref 0.61–1.24)
GFR calc Af Amer: >60 mL/min (ref >60)
GFR calc non Af Amer: >60 mL/min (ref >60)
Glucose, Bld: 139 mg/dL — ABNORMAL HIGH (ref 70–99)
Potassium: 3.8 mmol/L (ref 3.5–5.1)
Sodium: 154 mmol/L — ABNORMAL HIGH (ref 135–145)

## 2020-01-07 LAB — CBC
HCT: 49.4 % (ref 39.0–52.0)
Hemoglobin: 14.8 g/dL (ref 13.0–17.0)
MCH: 32.7 pg (ref 26.0–34.0)
MCHC: 30 g/dL (ref 30.0–36.0)
MCV: 109.1 fL — ABNORMAL HIGH (ref 80.0–100.0)
Platelets: 155 10*3/uL (ref 150–400)
RBC: 4.53 MIL/uL (ref 4.22–5.81)
RDW: 13.1 % (ref 11.5–15.5)
WBC: 9.8 10*3/uL (ref 4.0–10.5)
nRBC: 0 % (ref 0.0–0.2)

## 2020-01-07 LAB — POCT I-STAT 7, (LYTES, BLD GAS, ICA,H+H)
Acid-Base Excess: 13 mmol/L — ABNORMAL HIGH (ref 0.0–2.0)
Bicarbonate: 36.7 mmol/L — ABNORMAL HIGH (ref 20.0–28.0)
Calcium, Ion: 1.29 mmol/L (ref 1.15–1.40)
HCT: 43 % (ref 39.0–52.0)
Hemoglobin: 14.6 g/dL (ref 13.0–17.0)
O2 Saturation: 98 %
Patient temperature: 98.7
Potassium: 3.7 mmol/L (ref 3.5–5.1)
Sodium: 147 mmol/L — ABNORMAL HIGH (ref 135–145)
TCO2: 38 mmol/L — ABNORMAL HIGH (ref 22–32)
pCO2 arterial: 43.8 mmHg (ref 32.0–48.0)
pH, Arterial: 7.532 — ABNORMAL HIGH (ref 7.350–7.450)
pO2, Arterial: 97 mmHg (ref 83.0–108.0)

## 2020-01-07 LAB — GLUCOSE, CAPILLARY
Glucose-Capillary: 122 mg/dL — ABNORMAL HIGH (ref 70–99)
Glucose-Capillary: 67 mg/dL — ABNORMAL LOW (ref 70–99)
Glucose-Capillary: 119 mg/dL — ABNORMAL HIGH (ref 70–99)
Glucose-Capillary: 112 mg/dL — ABNORMAL HIGH (ref 70–99)
Glucose-Capillary: 130 mg/dL — ABNORMAL HIGH (ref 70–99)
Glucose-Capillary: 145 mg/dL — ABNORMAL HIGH (ref 70–99)

## 2020-01-07 MED ORDER — FREE WATER
250.0000 mL | Freq: Four times a day (QID) | Status: DC
  Administered 2020-01-07 – 2020-01-10 (×12): 250 mL
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
## 2020-01-07 NOTE — Progress Notes (Signed)
Pt with decline in mental status and unable to keep o2 sats above 86% on venti mask at 40%; pt placed on NRB and sat increase to 97%; lungs with wheezing; RT notified, and will give PRN neb; Dr [name] notified of change in status and interventions already done; received orders for ABG and CXR,; will continue to monitor and notify MD with any additional changes

## 2020-01-08 LAB — BASIC METABOLIC PANEL
Anion gap: 9 (ref 5–15)
BUN: 19 mg/dL (ref 8–23)
CO2: 34 mmol/L — ABNORMAL HIGH (ref 22–32)
Calcium: 9.9 mg/dL (ref 8.9–10.3)
Chloride: 109 mmol/L (ref 98–111)
Creatinine, Ser: 0.75 mg/dL (ref 0.61–1.24)
GFR calc Af Amer: >60 mL/min (ref >60)
GFR calc non Af Amer: >60 mL/min (ref >60)
Glucose, Bld: 149 mg/dL — ABNORMAL HIGH (ref 70–99)
Potassium: 3.7 mmol/L (ref 3.5–5.1)
Sodium: 152 mmol/L — ABNORMAL HIGH (ref 135–145)

## 2020-01-08 LAB — CBC
HCT: 50.7 % (ref 39.0–52.0)
Hemoglobin: 15.1 g/dL (ref 13.0–17.0)
MCH: 32.2 pg (ref 26.0–34.0)
MCHC: 29.8 g/dL — ABNORMAL LOW (ref 30.0–36.0)
MCV: 108.1 fL — ABNORMAL HIGH (ref 80.0–100.0)
Platelets: 174 10*3/uL (ref 150–400)
RBC: 4.69 MIL/uL (ref 4.22–5.81)
RDW: 13 % (ref 11.5–15.5)
WBC: 11.7 10*3/uL — ABNORMAL HIGH (ref 4.0–10.5)
nRBC: 0 % (ref 0.0–0.2)

## 2020-01-08 LAB — GLUCOSE, CAPILLARY
Glucose-Capillary: 130 mg/dL — ABNORMAL HIGH (ref 70–99)
Glucose-Capillary: 148 mg/dL — ABNORMAL HIGH (ref 70–99)
Glucose-Capillary: 166 mg/dL — ABNORMAL HIGH (ref 70–99)
Glucose-Capillary: 129 mg/dL — ABNORMAL HIGH (ref 70–99)
Glucose-Capillary: 145 mg/dL — ABNORMAL HIGH (ref 70–99)

## 2020-01-08 MED ORDER — POTASSIUM CHLORIDE 10 MEQ/50ML IV SOLN
10.0000 meq | INTRAVENOUS | Status: AC
  Administered 2020-01-08 (×2): 10 meq via INTRAVENOUS
  Filled 2020-01-08 (×2): qty 50

## 2020-01-08 MED ORDER — FUROSEMIDE 10 MG/ML IJ SOLN
40.0000 mg | Freq: Once | INTRAMUSCULAR | Status: AC
  Administered 2020-01-08: 40 mg via INTRAVENOUS
  Filled 2020-01-08: qty 4
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
## 2020-01-09 ENCOUNTER — Inpatient Hospital Stay (HOSPITAL_COMMUNITY): Payer: 59

## 2020-01-09 LAB — GLUCOSE, CAPILLARY
Glucose-Capillary: 124 mg/dL — ABNORMAL HIGH (ref 70–99)
Glucose-Capillary: 127 mg/dL — ABNORMAL HIGH (ref 70–99)
Glucose-Capillary: 129 mg/dL — ABNORMAL HIGH (ref 70–99)
Glucose-Capillary: 129 mg/dL — ABNORMAL HIGH (ref 70–99)
Glucose-Capillary: 134 mg/dL — ABNORMAL HIGH (ref 70–99)
Glucose-Capillary: 142 mg/dL — ABNORMAL HIGH (ref 70–99)
Glucose-Capillary: 159 mg/dL — ABNORMAL HIGH (ref 70–99)

## 2020-01-09 LAB — BASIC METABOLIC PANEL
Anion gap: 7 (ref 5–15)
BUN: 22 mg/dL (ref 8–23)
CO2: 36 mmol/L — ABNORMAL HIGH (ref 22–32)
Calcium: 10.3 mg/dL (ref 8.9–10.3)
Chloride: 109 mmol/L (ref 98–111)
Creatinine, Ser: 0.85 mg/dL (ref 0.61–1.24)
GFR calc Af Amer: >60 mL/min (ref >60)
GFR calc non Af Amer: >60 mL/min (ref >60)
Glucose, Bld: 129 mg/dL — ABNORMAL HIGH (ref 70–99)
Potassium: 4.4 mmol/L (ref 3.5–5.1)
Sodium: 152 mmol/L — ABNORMAL HIGH (ref 135–145)

## 2020-01-09 LAB — CBC
HCT: 51.6 % (ref 39.0–52.0)
Hemoglobin: 15.5 g/dL (ref 13.0–17.0)
MCH: 33.3 pg (ref 26.0–34.0)
MCHC: 30 g/dL (ref 30.0–36.0)
MCV: 110.7 fL — ABNORMAL HIGH (ref 80.0–100.0)
Platelets: 205 10*3/uL (ref 150–400)
RBC: 4.66 MIL/uL (ref 4.22–5.81)
RDW: 13.3 % (ref 11.5–15.5)
WBC: 12.2 10*3/uL — ABNORMAL HIGH (ref 4.0–10.5)
nRBC: 0 % (ref 0.0–0.2)

## 2020-01-09 MED ORDER — FUROSEMIDE 10 MG/ML IJ SOLN
40.0000 mg | Freq: Once | INTRAMUSCULAR | Status: AC
  Administered 2020-01-09: 40 mg via INTRAVENOUS
  Filled 2020-01-09: qty 4

## 2020-01-09 MED ORDER — LORAZEPAM 1 MG PO TABS
1.0000 mg | ORAL_TABLET | ORAL | Status: DC | PRN
  Administered 2020-01-11: 2 mg via ORAL
  Filled 2020-01-09: qty 2

## 2020-01-09 MED ORDER — LORAZEPAM 2 MG/ML IJ SOLN
1.0000 mg | INTRAMUSCULAR | Status: DC | PRN
  Administered 2020-01-09: 2 mg via INTRAVENOUS
  Administered 2020-01-09: 1 mg via INTRAVENOUS
  Administered 2020-01-09: 2 mg via INTRAVENOUS
  Administered 2020-01-10 – 2020-01-11 (×2): 3 mg via INTRAVENOUS
  Administered 2020-01-11: 2 mg via INTRAVENOUS
  Administered 2020-01-11: 3 mg via INTRAVENOUS
  Filled 2020-01-09 (×2): qty 2
  Filled 2020-01-09 (×3): qty 1
  Filled 2020-01-09: qty 2
  Filled 2020-01-09: qty 1
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
## 2020-01-09 NOTE — Progress Notes (Signed)
Nasotracheal suctioned patient retrieved large amount of thick white secretions.  Patients airway sounds clearer.  RT and RN will continue to assess for further clearing and suctioning.

## 2020-01-09 NOTE — Progress Notes (Signed)
Orthopedic Tech Progress Note Patient Details:  Roberto Lynch 05-Jan-1956 [id number] Cam walker at bedside. Did not apply to patient per order. Ortho Devices Type of Ortho Device: CAM walker Ortho Device/Splint Location: RLE Ortho Device/Splint Interventions: Ordered, Adjustment   Post Interventions Patient Tolerated: Other (comment) (Did not apply to patient) Instructions Provided: Adjustment of device   Kerry Fort 01/09/2020, 11:25 AM

## 2020-01-10 LAB — CBC
HCT: 53.5 % — ABNORMAL HIGH (ref 39.0–52.0)
Hemoglobin: 15.9 g/dL (ref 13.0–17.0)
MCH: 32.3 pg (ref 26.0–34.0)
MCHC: 29.7 g/dL — ABNORMAL LOW (ref 30.0–36.0)
MCV: 108.7 fL — ABNORMAL HIGH (ref 80.0–100.0)
Platelets: 220 10*3/uL (ref 150–400)
RBC: 4.92 MIL/uL (ref 4.22–5.81)
RDW: 13.3 % (ref 11.5–15.5)
WBC: 11.4 10*3/uL — ABNORMAL HIGH (ref 4.0–10.5)
nRBC: 0 % (ref 0.0–0.2)

## 2020-01-10 LAB — BASIC METABOLIC PANEL
Anion gap: 10 (ref 5–15)
Anion gap: 12 (ref 5–15)
BUN: 26 mg/dL — ABNORMAL HIGH (ref 8–23)
BUN: 31 mg/dL — ABNORMAL HIGH (ref 8–23)
CO2: 32 mmol/L (ref 22–32)
CO2: 36 mmol/L — ABNORMAL HIGH (ref 22–32)
Calcium: 9.9 mg/dL (ref 8.9–10.3)
Calcium: 10.5 mg/dL — ABNORMAL HIGH (ref 8.9–10.3)
Chloride: 110 mmol/L (ref 98–111)
Chloride: 105 mmol/L (ref 98–111)
Creatinine, Ser: 0.91 mg/dL (ref 0.61–1.24)
Creatinine, Ser: 1.24 mg/dL (ref 0.61–1.24)
GFR calc Af Amer: >60 mL/min (ref >60)
GFR calc Af Amer: >60 mL/min (ref >60)
GFR calc non Af Amer: >60 mL/min (ref >60)
GFR calc non Af Amer: >60 mL/min (ref >60)
Glucose, Bld: 133 mg/dL — ABNORMAL HIGH (ref 70–99)
Glucose, Bld: 163 mg/dL — ABNORMAL HIGH (ref 70–99)
Potassium: 4.2 mmol/L (ref 3.5–5.1)
Potassium: 3.9 mmol/L (ref 3.5–5.1)
Sodium: 152 mmol/L — ABNORMAL HIGH (ref 135–145)
Sodium: 153 mmol/L — ABNORMAL HIGH (ref 135–145)

## 2020-01-10 LAB — GLUCOSE, CAPILLARY
Glucose-Capillary: 151 mg/dL — ABNORMAL HIGH (ref 70–99)
Glucose-Capillary: 158 mg/dL — ABNORMAL HIGH (ref 70–99)
Glucose-Capillary: 122 mg/dL — ABNORMAL HIGH (ref 70–99)
Glucose-Capillary: 147 mg/dL — ABNORMAL HIGH (ref 70–99)
Glucose-Capillary: 132 mg/dL — ABNORMAL HIGH (ref 70–99)
Glucose-Capillary: 129 mg/dL — ABNORMAL HIGH (ref 70–99)

## 2020-01-10 MED ORDER — DOCUSATE SODIUM 50 MG/5ML PO LIQD
100.0000 mg | Freq: Every day | ORAL | Status: DC
  Administered 2020-01-10 – 2020-02-08 (×11): 100 mg
  Filled 2020-01-10 (×12): qty 10

## 2020-01-10 MED ORDER — METOLAZONE 5 MG PO TABS
10.0000 mg | ORAL_TABLET | Freq: Once | ORAL | Status: AC
  Administered 2020-01-10: 10 mg
  Filled 2020-01-10: qty 1
  Filled 2020-01-10: qty 2

## 2020-01-10 MED ORDER — SPIRITUS FRUMENTI
1.0000 | Freq: Two times a day (BID) | ORAL | Status: DC
  Administered 2020-01-10 – 2020-01-11 (×4): 1
  Filled 2020-01-10 (×5): qty 1

## 2020-01-10 MED ORDER — FUROSEMIDE 10 MG/ML IJ SOLN
40.0000 mg | Freq: Once | INTRAMUSCULAR | Status: AC
  Administered 2020-01-10: 40 mg via INTRAVENOUS
  Filled 2020-01-10: qty 4

## 2020-01-10 MED ORDER — FREE WATER
100.0000 mL | Freq: Three times a day (TID) | Status: DC
  Administered 2020-01-10 – 2020-01-13 (×8): 100 mL
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
## 2020-01-10 NOTE — Progress Notes (Signed)
Nasotracheal suction performed on patient.  RT retrieved a moderate amount of thick secretions.

## 2020-01-11 ENCOUNTER — Inpatient Hospital Stay (HOSPITAL_COMMUNITY): Payer: 59

## 2020-01-11 LAB — BASIC METABOLIC PANEL
Anion gap: 11 (ref 5–15)
BUN: 38 mg/dL — ABNORMAL HIGH (ref 8–23)
CO2: 35 mmol/L — ABNORMAL HIGH (ref 22–32)
Calcium: 10.4 mg/dL — ABNORMAL HIGH (ref 8.9–10.3)
Chloride: 104 mmol/L (ref 98–111)
Creatinine, Ser: 1.14 mg/dL (ref 0.61–1.24)
GFR calc Af Amer: >60 mL/min (ref >60)
GFR calc non Af Amer: >60 mL/min (ref >60)
Glucose, Bld: 127 mg/dL — ABNORMAL HIGH (ref 70–99)
Potassium: 4.1 mmol/L (ref 3.5–5.1)
Sodium: 150 mmol/L — ABNORMAL HIGH (ref 135–145)

## 2020-01-11 LAB — GLUCOSE, CAPILLARY
Glucose-Capillary: 120 mg/dL — ABNORMAL HIGH (ref 70–99)
Glucose-Capillary: 124 mg/dL — ABNORMAL HIGH (ref 70–99)
Glucose-Capillary: 149 mg/dL — ABNORMAL HIGH (ref 70–99)
Glucose-Capillary: 157 mg/dL — ABNORMAL HIGH (ref 70–99)
Glucose-Capillary: 140 mg/dL — ABNORMAL HIGH (ref 70–99)
Glucose-Capillary: 166 mg/dL — ABNORMAL HIGH (ref 70–99)

## 2020-01-11 LAB — CBC
HCT: 55.2 % — ABNORMAL HIGH (ref 39.0–52.0)
Hemoglobin: 16.8 g/dL (ref 13.0–17.0)
MCH: 33.2 pg (ref 26.0–34.0)
MCHC: 30.4 g/dL (ref 30.0–36.0)
MCV: 109.1 fL — ABNORMAL HIGH (ref 80.0–100.0)
Platelets: 230 10*3/uL (ref 150–400)
RBC: 5.06 MIL/uL (ref 4.22–5.81)
RDW: 13.3 % (ref 11.5–15.5)
WBC: 11.3 10*3/uL — ABNORMAL HIGH (ref 4.0–10.5)
nRBC: 0.2 % (ref 0.0–0.2)

## 2020-01-11 LAB — MAGNESIUM: Magnesium: 3 mg/dL — ABNORMAL HIGH (ref 1.7–2.4)

## 2020-01-11 LAB — URINALYSIS, ROUTINE W REFLEX MICROSCOPIC
Bilirubin Urine: NEGATIVE
Glucose, UA: NEGATIVE mg/dL
Ketones, ur: NEGATIVE mg/dL
Nitrite: NEGATIVE
Protein, ur: NEGATIVE mg/dL
Specific Gravity, Urine: 1.006 (ref 1.005–1.030)
pH: 5 (ref 5.0–8.0)

## 2020-01-11 LAB — T4, FREE: Free T4: 0.77 ng/dL (ref 0.61–1.12)

## 2020-01-11 LAB — AMMONIA: Ammonia: 52 umol/L — ABNORMAL HIGH (ref 9–35)

## 2020-01-11 LAB — TSH: TSH: 1.917 u[IU]/mL (ref 0.350–4.500)

## 2020-01-11 LAB — PHOSPHORUS: Phosphorus: 5 mg/dL — ABNORMAL HIGH (ref 2.5–4.6)

## 2020-01-11 MED ORDER — FUROSEMIDE 10 MG/ML IJ SOLN
40.0000 mg | Freq: Once | INTRAMUSCULAR | Status: AC
  Administered 2020-01-11: 40 mg via INTRAVENOUS
  Filled 2020-01-11: qty 4

## 2020-01-11 MED ORDER — CLONAZEPAM 0.5 MG PO TABS: 0.2500 mg | ORAL_TABLET | Freq: Three times a day (TID) | ORAL | Status: DC

## 2020-01-11 MED ORDER — METOLAZONE 5 MG PO TABS
10.0000 mg | ORAL_TABLET | Freq: Once | ORAL | Status: AC
  Administered 2020-01-11: 10 mg via ORAL
  Filled 2020-01-11: qty 2

## 2020-01-11 MED ORDER — LACTULOSE 10 GM/15ML PO SOLN
20.0000 g | Freq: Once | ORAL | Status: AC
  Administered 2020-01-11: 20 g
  Filled 2020-01-11: qty 30

## 2020-01-11 MED ORDER — CHLORHEXIDINE GLUCONATE CLOTH 2 % EX PADS
6.0000 | MEDICATED_PAD | Freq: Every day | CUTANEOUS | Status: DC
  Administered 2020-01-12 – 2020-02-08 (×27): 6 via TOPICAL

## 2020-01-11 MED ORDER — ACETAMINOPHEN 10 MG/ML IV SOLN
1000.0000 mg | Freq: Four times a day (QID) | INTRAVENOUS | Status: AC
  Administered 2020-01-11 (×2): 1000 mg via INTRAVENOUS
  Filled 2020-01-11 (×2): qty 100

## 2020-01-11 MED ORDER — CLONAZEPAM 0.25 MG PO TBDP
0.2500 mg | ORAL_TABLET | Freq: Three times a day (TID) | ORAL | Status: DC
  Administered 2020-01-11 – 2020-01-12 (×3): 0.25 mg via ORAL
  Filled 2020-01-11 (×3): qty 1
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
## 2020-01-11 NOTE — Progress Notes (Signed)
Cortrak clogged. Unable to flush. MD Tsuei made aware, verbal order given for tylenol via alternate route and okay to hold klonopin and robaxin. IV tylenol order put in d/t flexiseal in place.
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
## 2020-01-12 LAB — POCT I-STAT 7, (LYTES, BLD GAS, ICA,H+H)
Acid-Base Excess: 13 mmol/L — ABNORMAL HIGH (ref 0.0–2.0)
Acid-Base Excess: 13 mmol/L — ABNORMAL HIGH (ref 0.0–2.0)
Bicarbonate: 42.9 mmol/L — ABNORMAL HIGH (ref 20.0–28.0)
Bicarbonate: 41.9 mmol/L — ABNORMAL HIGH (ref 20.0–28.0)
Calcium, Ion: 1.38 mmol/L (ref 1.15–1.40)
Calcium, Ion: 1.38 mmol/L (ref 1.15–1.40)
HCT: 53 % — ABNORMAL HIGH (ref 39.0–52.0)
HCT: 53 % — ABNORMAL HIGH (ref 39.0–52.0)
Hemoglobin: 18 g/dL — ABNORMAL HIGH (ref 13.0–17.0)
Hemoglobin: 18 g/dL — ABNORMAL HIGH (ref 13.0–17.0)
O2 Saturation: 91 %
O2 Saturation: 94 %
Patient temperature: 98.6
Patient temperature: 98.6
Potassium: 4 mmol/L (ref 3.5–5.1)
Potassium: 4.3 mmol/L (ref 3.5–5.1)
Sodium: 152 mmol/L — ABNORMAL HIGH (ref 135–145)
Sodium: 151 mmol/L — ABNORMAL HIGH (ref 135–145)
TCO2: 45 mmol/L — ABNORMAL HIGH (ref 22–32)
TCO2: 44 mmol/L — ABNORMAL HIGH (ref 22–32)
pCO2 arterial: 70.2 mmHg (ref 32.0–48.0)
pCO2 arterial: 65.7 mmHg (ref 32.0–48.0)
pH, Arterial: 7.395 (ref 7.350–7.450)
pH, Arterial: 7.412 (ref 7.350–7.450)
pO2, Arterial: 64 mmHg — ABNORMAL LOW (ref 83.0–108.0)
pO2, Arterial: 73 mmHg — ABNORMAL LOW (ref 83.0–108.0)

## 2020-01-12 LAB — BLOOD CULTURE ID PANEL (REFLEXED) - BCID2

## 2020-01-12 LAB — CBC
HCT: 56.5 % — ABNORMAL HIGH (ref 39.0–52.0)
Hemoglobin: 16.9 g/dL (ref 13.0–17.0)
MCH: 32.2 pg (ref 26.0–34.0)
MCHC: 29.9 g/dL — ABNORMAL LOW (ref 30.0–36.0)
MCV: 107.6 fL — ABNORMAL HIGH (ref 80.0–100.0)
Platelets: 250 10*3/uL (ref 150–400)
RBC: 5.25 MIL/uL (ref 4.22–5.81)
RDW: 13.2 % (ref 11.5–15.5)
WBC: 12.6 10*3/uL — ABNORMAL HIGH (ref 4.0–10.5)
nRBC: 0.2 % (ref 0.0–0.2)

## 2020-01-12 LAB — GLUCOSE, CAPILLARY
Glucose-Capillary: 176 mg/dL — ABNORMAL HIGH (ref 70–99)
Glucose-Capillary: 175 mg/dL — ABNORMAL HIGH (ref 70–99)
Glucose-Capillary: 150 mg/dL — ABNORMAL HIGH (ref 70–99)
Glucose-Capillary: 165 mg/dL — ABNORMAL HIGH (ref 70–99)
Glucose-Capillary: 148 mg/dL — ABNORMAL HIGH (ref 70–99)
Glucose-Capillary: 144 mg/dL — ABNORMAL HIGH (ref 70–99)

## 2020-01-12 LAB — BASIC METABOLIC PANEL
Anion gap: 11 (ref 5–15)
BUN: 66 mg/dL — ABNORMAL HIGH (ref 8–23)
CO2: 37 mmol/L — ABNORMAL HIGH (ref 22–32)
Calcium: 10.8 mg/dL — ABNORMAL HIGH (ref 8.9–10.3)
Chloride: 101 mmol/L (ref 98–111)
Creatinine, Ser: 1.59 mg/dL — ABNORMAL HIGH (ref 0.61–1.24)
GFR calc Af Amer: 53 mL/min — ABNORMAL LOW (ref >60)
GFR calc non Af Amer: 46 mL/min — ABNORMAL LOW (ref >60)
Glucose, Bld: 171 mg/dL — ABNORMAL HIGH (ref 70–99)
Potassium: 4.1 mmol/L (ref 3.5–5.1)
Sodium: 149 mmol/L — ABNORMAL HIGH (ref 135–145)

## 2020-01-12 LAB — URINE CULTURE

## 2020-01-12 LAB — PHOSPHORUS: Phosphorus: 5 mg/dL — ABNORMAL HIGH (ref 2.5–4.6)

## 2020-01-12 LAB — MAGNESIUM: Magnesium: 3.4 mg/dL — ABNORMAL HIGH (ref 1.7–2.4)

## 2020-01-12 LAB — T3, FREE: T3, Free: 1.9 pg/mL — ABNORMAL LOW (ref 2.0–4.4)

## 2020-01-12 MED ORDER — FUROSEMIDE 10 MG/ML IJ SOLN
40.0000 mg | Freq: Once | INTRAMUSCULAR | Status: DC
  Filled 2020-01-12: qty 4

## 2020-01-12 MED ORDER — LORAZEPAM 1 MG PO TABS
1.0000 mg | ORAL_TABLET | ORAL | Status: DC | PRN
  Administered 2020-01-12 – 2020-01-13 (×2): 2 mg via ORAL
  Filled 2020-01-12 (×2): qty 2

## 2020-01-12 MED ORDER — CLONAZEPAM 0.25 MG PO TBDP: 0.2500 mg | ORAL_TABLET | Freq: Three times a day (TID) | ORAL | Status: DC

## 2020-01-12 MED ORDER — LACTULOSE 10 GM/15ML PO SOLN
20.0000 g | Freq: Every day | ORAL | Status: AC
  Administered 2020-01-12: 20 g
  Filled 2020-01-12: qty 30

## 2020-01-12 MED ORDER — ALBUMIN HUMAN 25 % IV SOLN
12.5000 g | Freq: Once | INTRAVENOUS | Status: AC
  Administered 2020-01-12: 12.5 g via INTRAVENOUS
  Filled 2020-01-12: qty 50

## 2020-01-12 MED ORDER — LORAZEPAM 2 MG/ML IJ SOLN
1.0000 mg | INTRAMUSCULAR | Status: DC | PRN
  Administered 2020-01-12 – 2020-01-14 (×2): 2 mg via INTRAVENOUS
  Administered 2020-01-14: 4 mg via INTRAVENOUS
  Administered 2020-01-14: 2 mg via INTRAVENOUS
  Filled 2020-01-12 (×3): qty 1
  Filled 2020-01-12: qty 2

## 2020-01-12 MED ORDER — CLONAZEPAM 0.25 MG PO TBDP
0.2500 mg | ORAL_TABLET | Freq: Two times a day (BID) | ORAL | Status: DC
  Administered 2020-01-12 (×2): 0.25 mg
  Filled 2020-01-12 (×2): qty 1

## 2020-01-12 MED ORDER — METOLAZONE 10 MG PO TABS
10.0000 mg | ORAL_TABLET | Freq: Once | ORAL | Status: DC
  Filled 2020-01-12: qty 1
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
## 2020-01-12 NOTE — Progress Notes (Signed)
Patient continuously taking off bipap. RT placed patient back on venti mask. RN aware.

## 2020-01-12 NOTE — Progress Notes (Signed)
RT obtained ABG per MD order and reported critical CO2 of 65.7 to RN. ABG as follows: pH 7.41, CO2 65.7, PO2 73 and HCO3 41.9. RT will continue to monitor.

## 2020-01-12 NOTE — Progress Notes (Signed)
RT placed patient on bipap per MD order with RN at bedside. Patient taken off restraints for bipap. RT will continue to monitor.

## 2020-01-12 NOTE — Progress Notes (Signed)
PT Cancellation Note  Patient Details Name: Roberto Lynch MRN: [id number] DOB: 01/15/1956   Cancelled Treatment:    Reason Eval/Treat Not Completed: Medical issues which prohibited therapy. Per discussion with RN pt placed on BiPAP this morning and remains lethargic and unable to participate in skilled PT intervention. PT will hold at this time and follow up when the pt is more medically appropriate to mobilize.   Arlyss Gandy 01/12/2020, 11:07 AM
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
## 2020-01-13 ENCOUNTER — Inpatient Hospital Stay (HOSPITAL_COMMUNITY): Payer: 59

## 2020-01-13 LAB — POCT I-STAT 7, (LYTES, BLD GAS, ICA,H+H)
Acid-Base Excess: 16 mmol/L — ABNORMAL HIGH (ref 0.0–2.0)
Bicarbonate: 42.4 mmol/L — ABNORMAL HIGH (ref 20.0–28.0)
Calcium, Ion: 1.38 mmol/L (ref 1.15–1.40)
HCT: 52 % (ref 39.0–52.0)
Hemoglobin: 17.7 g/dL — ABNORMAL HIGH (ref 13.0–17.0)
O2 Saturation: 94 %
Patient temperature: 98.6
Potassium: 4 mmol/L (ref 3.5–5.1)
Sodium: 153 mmol/L — ABNORMAL HIGH (ref 135–145)
TCO2: 44 mmol/L — ABNORMAL HIGH (ref 22–32)
pCO2 arterial: 55.1 mmHg — ABNORMAL HIGH (ref 32.0–48.0)
pH, Arterial: 7.495 — ABNORMAL HIGH (ref 7.350–7.450)
pO2, Arterial: 66 mmHg — ABNORMAL LOW (ref 83.0–108.0)

## 2020-01-13 LAB — CBC
HCT: 55.6 % — ABNORMAL HIGH (ref 39.0–52.0)
Hemoglobin: 16.6 g/dL (ref 13.0–17.0)
MCH: 32 pg (ref 26.0–34.0)
MCHC: 29.9 g/dL — ABNORMAL LOW (ref 30.0–36.0)
MCV: 107.3 fL — ABNORMAL HIGH (ref 80.0–100.0)
Platelets: 243 K/uL (ref 150–400)
RBC: 5.18 MIL/uL (ref 4.22–5.81)
RDW: 13.2 % (ref 11.5–15.5)
WBC: 11.7 K/uL — ABNORMAL HIGH (ref 4.0–10.5)
nRBC: 0 % (ref 0.0–0.2)

## 2020-01-13 LAB — GLUCOSE, CAPILLARY
Glucose-Capillary: 147 mg/dL — ABNORMAL HIGH (ref 70–99)
Glucose-Capillary: 167 mg/dL — ABNORMAL HIGH (ref 70–99)
Glucose-Capillary: 155 mg/dL — ABNORMAL HIGH (ref 70–99)
Glucose-Capillary: 147 mg/dL — ABNORMAL HIGH (ref 70–99)
Glucose-Capillary: 130 mg/dL — ABNORMAL HIGH (ref 70–99)
Glucose-Capillary: 163 mg/dL — ABNORMAL HIGH (ref 70–99)

## 2020-01-13 LAB — BASIC METABOLIC PANEL
Anion gap: 14 (ref 5–15)
BUN: 86 mg/dL — ABNORMAL HIGH (ref 8–23)
CO2: 37 mmol/L — ABNORMAL HIGH (ref 22–32)
Calcium: 11.1 mg/dL — ABNORMAL HIGH (ref 8.9–10.3)
Chloride: 102 mmol/L (ref 98–111)
Creatinine, Ser: 1.78 mg/dL — ABNORMAL HIGH (ref 0.61–1.24)
GFR calc Af Amer: 46 mL/min — ABNORMAL LOW (ref >60)
GFR calc non Af Amer: 40 mL/min — ABNORMAL LOW (ref >60)
Glucose, Bld: 148 mg/dL — ABNORMAL HIGH (ref 70–99)
Potassium: 4.1 mmol/L (ref 3.5–5.1)
Sodium: 153 mmol/L — ABNORMAL HIGH (ref 135–145)

## 2020-01-13 LAB — MAGNESIUM: Magnesium: 3.6 mg/dL — ABNORMAL HIGH (ref 1.7–2.4)

## 2020-01-13 LAB — PHOSPHORUS: Phosphorus: 4.1 mg/dL (ref 2.5–4.6)

## 2020-01-13 LAB — AMMONIA: Ammonia: 44 umol/L — ABNORMAL HIGH (ref 9–35)

## 2020-01-13 MED ORDER — FREE WATER
200.0000 mL | Freq: Four times a day (QID) | Status: DC
  Administered 2020-01-13 – 2020-01-14 (×4): 200 mL

## 2020-01-13 MED ORDER — LACTULOSE 10 GM/15ML PO SOLN
20.0000 g | Freq: Once | ORAL | Status: AC
  Administered 2020-01-13: 20 g
  Filled 2020-01-13: qty 30

## 2020-01-13 NOTE — Progress Notes (Signed)
Orthopedic Tech Progress Note Patient Details:  Roberto Lynch [birth year] [id number]  Casting Type of Cast: Short leg cast Cast Location: Patient Right Leg Cast Material: Plaster Cast Intervention: Application  Post Interventions Patient Tolerated: Well Instructions Provided: Adjustment of device     [name] [name] 01/13/2020, 11:08 AM

## 2020-01-13 NOTE — Progress Notes (Signed)
VAST consulted to obtain IV access and pull PICC.  Called unit and spoke with pt's nurse, Candise Bowens. Pt currently is not receiving IV fluids or regular IV medications. PRN medication, Ativan last given yesterday. He is however, receiving daily labwork at this time.  Educated about vein preservation and not placing "just in case" IV's. Advised VAST will come assess pt's vasculature before discontinuing PICC.
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
## 2020-01-13 NOTE — Progress Notes (Signed)
VAST RN assessed pt's vasculature utilizing [place] prior to discontinuing PICC. Pt has very limited and small vessels in right arm. In left arm, pt has a few vessels appropriate for IV access. Since patient is not currently receiving IV meds/fluids, PICC pulled.  4N ICU RN placed IV in left arm just above antecubital.  RA DL PICC removed per protocol per MD order. Manual pressure applied for 5 mins. No bleeding or swelling noted. Instructed nurse to keep dressing dry and intact for 24 hours. She verbalized comprehension.
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
## 2020-01-14 LAB — GLUCOSE, CAPILLARY
Glucose-Capillary: 161 mg/dL — ABNORMAL HIGH (ref 70–99)
Glucose-Capillary: 169 mg/dL — ABNORMAL HIGH (ref 70–99)
Glucose-Capillary: 167 mg/dL — ABNORMAL HIGH (ref 70–99)
Glucose-Capillary: 163 mg/dL — ABNORMAL HIGH (ref 70–99)
Glucose-Capillary: 177 mg/dL — ABNORMAL HIGH (ref 70–99)
Glucose-Capillary: 157 mg/dL — ABNORMAL HIGH (ref 70–99)

## 2020-01-14 LAB — POCT I-STAT 7, (LYTES, BLD GAS, ICA,H+H)
Acid-Base Excess: 13 mmol/L — ABNORMAL HIGH (ref 0.0–2.0)
Acid-Base Excess: 13 mmol/L — ABNORMAL HIGH (ref 0.0–2.0)
Bicarbonate: 42.7 mmol/L — ABNORMAL HIGH (ref 20.0–28.0)
Bicarbonate: 42.6 mmol/L — ABNORMAL HIGH (ref 20.0–28.0)
Calcium, Ion: 1.44 mmol/L — ABNORMAL HIGH (ref 1.15–1.40)
Calcium, Ion: 1.4 mmol/L (ref 1.15–1.40)
HCT: 53 % — ABNORMAL HIGH (ref 39.0–52.0)
HCT: 52 % (ref 39.0–52.0)
Hemoglobin: 18 g/dL — ABNORMAL HIGH (ref 13.0–17.0)
Hemoglobin: 17.7 g/dL — ABNORMAL HIGH (ref 13.0–17.0)
O2 Saturation: 95 %
O2 Saturation: 96 %
Potassium: 4.2 mmol/L (ref 3.5–5.1)
Potassium: 4.4 mmol/L (ref 3.5–5.1)
Sodium: 156 mmol/L — ABNORMAL HIGH (ref 135–145)
Sodium: 153 mmol/L — ABNORMAL HIGH (ref 135–145)
TCO2: 45 mmol/L — ABNORMAL HIGH (ref 22–32)
TCO2: 45 mmol/L — ABNORMAL HIGH (ref 22–32)
pCO2 arterial: 74.3 mmHg (ref 32.0–48.0)
pCO2 arterial: 68.5 mmHg (ref 32.0–48.0)
pH, Arterial: 7.368 (ref 7.350–7.450)
pH, Arterial: 7.402 (ref 7.350–7.450)
pO2, Arterial: 84 mmHg (ref 83.0–108.0)
pO2, Arterial: 87 mmHg (ref 83.0–108.0)

## 2020-01-14 LAB — CBC
HCT: 56.5 % — ABNORMAL HIGH (ref 39.0–52.0)
Hemoglobin: 17.2 g/dL — ABNORMAL HIGH (ref 13.0–17.0)
MCH: 32.9 pg (ref 26.0–34.0)
MCHC: 30.4 g/dL (ref 30.0–36.0)
MCV: 108 fL — ABNORMAL HIGH (ref 80.0–100.0)
Platelets: 240 10*3/uL (ref 150–400)
RBC: 5.23 MIL/uL (ref 4.22–5.81)
RDW: 13.3 % (ref 11.5–15.5)
WBC: 10.5 10*3/uL (ref 4.0–10.5)
nRBC: 0 % (ref 0.0–0.2)

## 2020-01-14 LAB — CULTURE, BLOOD (ROUTINE X 2): Special Requests: ADEQUATE

## 2020-01-14 MED ORDER — LORAZEPAM 1 MG PO TABS
1.0000 mg | ORAL_TABLET | ORAL | Status: AC | PRN
  Administered 2020-01-15: 2 mg
  Filled 2020-01-14: qty 2

## 2020-01-14 MED ORDER — LORAZEPAM 2 MG/ML IJ SOLN
1.0000 mg | INTRAMUSCULAR | Status: AC | PRN
  Administered 2020-01-15: 2 mg via INTRAVENOUS
  Filled 2020-01-14: qty 1

## 2020-01-14 MED ORDER — FREE WATER
200.0000 mL | Status: DC
  Administered 2020-01-14 – 2020-01-18 (×24): 200 mL

## 2020-01-14 NOTE — Progress Notes (Signed)
0330- Paged trauma MD about abg results. CO2 increased to 74.3 despite have been on bi-pap starting around 2230 last night, total of 5 hours. Verbal order given to RT for setting changes on bi-pap.   0500- Trauma MD paged about re-draw abg results. CO2 came down to 68.5. Verbal order to continue bi-pap at current settings until the next shift. Will continue to monitor.
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
## 2020-01-15 ENCOUNTER — Inpatient Hospital Stay (HOSPITAL_COMMUNITY): Payer: 59

## 2020-01-15 LAB — CBC
HCT: 54.1 % — ABNORMAL HIGH (ref 39.0–52.0)
Hemoglobin: 16.7 g/dL (ref 13.0–17.0)
MCH: 33.1 pg (ref 26.0–34.0)
MCHC: 30.9 g/dL (ref 30.0–36.0)
MCV: 107.1 fL — ABNORMAL HIGH (ref 80.0–100.0)
Platelets: 220 10*3/uL (ref 150–400)
RBC: 5.05 MIL/uL (ref 4.22–5.81)
RDW: 13.3 % (ref 11.5–15.5)
WBC: 11 10*3/uL — ABNORMAL HIGH (ref 4.0–10.5)
nRBC: 0 % (ref 0.0–0.2)

## 2020-01-15 LAB — BASIC METABOLIC PANEL
Anion gap: 15 (ref 5–15)
BUN: 116 mg/dL — ABNORMAL HIGH (ref 8–23)
CO2: 32 mmol/L (ref 22–32)
Calcium: 11 mg/dL — ABNORMAL HIGH (ref 8.9–10.3)
Chloride: 105 mmol/L (ref 98–111)
Creatinine, Ser: 2.06 mg/dL — ABNORMAL HIGH (ref 0.61–1.24)
GFR calc Af Amer: 39 mL/min — ABNORMAL LOW (ref >60)
GFR calc non Af Amer: 34 mL/min — ABNORMAL LOW (ref >60)
Glucose, Bld: 154 mg/dL — ABNORMAL HIGH (ref 70–99)
Potassium: 3.6 mmol/L (ref 3.5–5.1)
Sodium: 152 mmol/L — ABNORMAL HIGH (ref 135–145)

## 2020-01-15 LAB — URINALYSIS, ROUTINE W REFLEX MICROSCOPIC
Bilirubin Urine: NEGATIVE
Glucose, UA: NEGATIVE mg/dL
Ketones, ur: NEGATIVE mg/dL
Nitrite: POSITIVE — AB
Protein, ur: NEGATIVE mg/dL
RBC / HPF: >50 RBC/hpf — ABNORMAL HIGH (ref 0–5)
Specific Gravity, Urine: 1.017 (ref 1.005–1.030)
pH: 6 (ref 5.0–8.0)

## 2020-01-15 LAB — GLUCOSE, CAPILLARY
Glucose-Capillary: 174 mg/dL — ABNORMAL HIGH (ref 70–99)
Glucose-Capillary: 174 mg/dL — ABNORMAL HIGH (ref 70–99)
Glucose-Capillary: 157 mg/dL — ABNORMAL HIGH (ref 70–99)

## 2020-01-15 MED ORDER — LORAZEPAM 2 MG/ML IJ SOLN: 1.0000 mg | INTRAMUSCULAR | Status: AC | PRN

## 2020-01-15 MED ORDER — LORAZEPAM 1 MG PO TABS
1.0000 mg | ORAL_TABLET | ORAL | Status: DC | PRN
  Administered 2020-01-16: 2 mg
  Filled 2020-01-15: qty 2

## 2020-01-15 MED ORDER — LORAZEPAM 2 MG/ML IJ SOLN
1.0000 mg | INTRAMUSCULAR | Status: DC | PRN
  Administered 2020-01-15 – 2020-01-16 (×3): 2 mg via INTRAVENOUS
  Filled 2020-01-15: qty 1
  Filled 2020-01-15: qty 2
  Filled 2020-01-15: qty 1

## 2020-01-15 MED ORDER — LORAZEPAM 1 MG PO TABS: 1.0000 mg | ORAL_TABLET | ORAL | Status: AC | PRN
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
## 2020-01-16 ENCOUNTER — Inpatient Hospital Stay (HOSPITAL_COMMUNITY): Payer: 59

## 2020-01-16 DIAGNOSIS — S92191A Other fracture of right talus, initial encounter for closed fracture: Secondary | ICD-10-CM

## 2020-01-16 LAB — BASIC METABOLIC PANEL
Anion gap: 14 (ref 5–15)
BUN: 100 mg/dL — ABNORMAL HIGH (ref 8–23)
CO2: 32 mmol/L (ref 22–32)
Calcium: 11.2 mg/dL — ABNORMAL HIGH (ref 8.9–10.3)
Chloride: 108 mmol/L (ref 98–111)
Creatinine, Ser: 1.96 mg/dL — ABNORMAL HIGH (ref 0.61–1.24)
GFR calc Af Amer: 41 mL/min — ABNORMAL LOW (ref >60)
GFR calc non Af Amer: 36 mL/min — ABNORMAL LOW (ref >60)
Glucose, Bld: 163 mg/dL — ABNORMAL HIGH (ref 70–99)
Potassium: 3.7 mmol/L (ref 3.5–5.1)
Sodium: 154 mmol/L — ABNORMAL HIGH (ref 135–145)

## 2020-01-16 LAB — GLUCOSE, CAPILLARY
Glucose-Capillary: 135 mg/dL — ABNORMAL HIGH (ref 70–99)
Glucose-Capillary: 186 mg/dL — ABNORMAL HIGH (ref 70–99)
Glucose-Capillary: 174 mg/dL — ABNORMAL HIGH (ref 70–99)
Glucose-Capillary: 169 mg/dL — ABNORMAL HIGH (ref 70–99)
Glucose-Capillary: 125 mg/dL — ABNORMAL HIGH (ref 70–99)
Glucose-Capillary: 145 mg/dL — ABNORMAL HIGH (ref 70–99)

## 2020-01-16 LAB — POCT I-STAT 7, (LYTES, BLD GAS, ICA,H+H)
Acid-Base Excess: 13 mmol/L — ABNORMAL HIGH (ref 0.0–2.0)
Bicarbonate: 39.4 mmol/L — ABNORMAL HIGH (ref 20.0–28.0)
Calcium, Ion: 1.42 mmol/L — ABNORMAL HIGH (ref 1.15–1.40)
HCT: 51 % (ref 39.0–52.0)
Hemoglobin: 17.3 g/dL — ABNORMAL HIGH (ref 13.0–17.0)
O2 Saturation: 96 %
Patient temperature: 97.8
Potassium: 3.5 mmol/L (ref 3.5–5.1)
Sodium: 156 mmol/L — ABNORMAL HIGH (ref 135–145)
TCO2: 41 mmol/L — ABNORMAL HIGH (ref 22–32)
pCO2 arterial: 51.2 mmHg — ABNORMAL HIGH (ref 32.0–48.0)
pH, Arterial: 7.493 — ABNORMAL HIGH (ref 7.350–7.450)
pO2, Arterial: 77 mmHg — ABNORMAL LOW (ref 83.0–108.0)

## 2020-01-16 LAB — CBC
HCT: 55.3 % — ABNORMAL HIGH (ref 39.0–52.0)
Hemoglobin: 16.9 g/dL (ref 13.0–17.0)
MCH: 32.4 pg (ref 26.0–34.0)
MCHC: 30.6 g/dL (ref 30.0–36.0)
MCV: 105.9 fL — ABNORMAL HIGH (ref 80.0–100.0)
Platelets: 220 10*3/uL (ref 150–400)
RBC: 5.22 MIL/uL (ref 4.22–5.81)
RDW: 13.2 % (ref 11.5–15.5)
WBC: 11.9 10*3/uL — ABNORMAL HIGH (ref 4.0–10.5)
nRBC: 0.2 % (ref 0.0–0.2)

## 2020-01-16 LAB — MAGNESIUM: Magnesium: 3.2 mg/dL — ABNORMAL HIGH (ref 1.7–2.4)

## 2020-01-16 LAB — CULTURE, BLOOD (ROUTINE X 2)
Culture: NO GROWTH
Special Requests: ADEQUATE

## 2020-01-16 MED ORDER — ARFORMOTEROL TARTRATE 15 MCG/2ML IN NEBU
15.0000 ug | INHALATION_SOLUTION | Freq: Two times a day (BID) | RESPIRATORY_TRACT | Status: DC
  Administered 2020-01-16 – 2020-02-08 (×44): 15 ug via RESPIRATORY_TRACT
  Filled 2020-01-16 (×48): qty 2

## 2020-01-16 MED ORDER — SODIUM CHLORIDE 0.9 % IV BOLUS
500.0000 mL | Freq: Once | INTRAVENOUS | Status: AC
  Administered 2020-01-16: 500 mL via INTRAVENOUS

## 2020-01-16 MED ORDER — SODIUM CHLORIDE 0.9 % IV SOLN: INTRAVENOUS | Status: DC

## 2020-01-16 MED ORDER — BUDESONIDE 0.25 MG/2ML IN SUSP
0.2500 mg | Freq: Two times a day (BID) | RESPIRATORY_TRACT | Status: DC
  Administered 2020-01-16 – 2020-01-20 (×9): 0.25 mg via RESPIRATORY_TRACT
  Filled 2020-01-16 (×9): qty 2

## 2020-01-16 MED ORDER — LORAZEPAM 2 MG/ML IJ SOLN
1.0000 mg | INTRAMUSCULAR | Status: DC | PRN
  Administered 2020-01-16 – 2020-01-17 (×4): 1 mg via INTRAVENOUS
  Filled 2020-01-16 (×4): qty 1

## 2020-01-16 MED ORDER — SODIUM CHLORIDE 3 % IN NEBU
4.0000 mL | INHALATION_SOLUTION | Freq: Every day | RESPIRATORY_TRACT | Status: AC
  Administered 2020-01-17 – 2020-01-18 (×2): 4 mL via RESPIRATORY_TRACT
  Filled 2020-01-16: qty 4

## 2020-01-16 MED ORDER — GUAIFENESIN 100 MG/5ML PO SOLN
10.0000 mL | Freq: Two times a day (BID) | ORAL | Status: DC
  Administered 2020-01-16 – 2020-01-18 (×5): 200 mg
  Filled 2020-01-16 (×4): qty 15
  Filled 2020-01-16: qty 30

## 2020-01-16 MED ORDER — GUAIFENESIN ER 600 MG PO TB12
600.0000 mg | ORAL_TABLET | Freq: Two times a day (BID) | ORAL | Status: DC
  Filled 2020-01-16 (×2): qty 1
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
## 2020-01-17 DIAGNOSIS — J9622 Acute and chronic respiratory failure with hypercapnia: Secondary | ICD-10-CM | POA: Diagnosis not present

## 2020-01-17 DIAGNOSIS — J9621 Acute and chronic respiratory failure with hypoxia: Secondary | ICD-10-CM | POA: Diagnosis not present

## 2020-01-17 DIAGNOSIS — J9611 Chronic respiratory failure with hypoxia: Secondary | ICD-10-CM

## 2020-01-17 DIAGNOSIS — J9612 Chronic respiratory failure with hypercapnia: Secondary | ICD-10-CM | POA: Diagnosis not present

## 2020-01-17 LAB — GLUCOSE, CAPILLARY
Glucose-Capillary: 144 mg/dL — ABNORMAL HIGH (ref 70–99)
Glucose-Capillary: 140 mg/dL — ABNORMAL HIGH (ref 70–99)
Glucose-Capillary: 156 mg/dL — ABNORMAL HIGH (ref 70–99)
Glucose-Capillary: 165 mg/dL — ABNORMAL HIGH (ref 70–99)
Glucose-Capillary: 126 mg/dL — ABNORMAL HIGH (ref 70–99)
Glucose-Capillary: 129 mg/dL — ABNORMAL HIGH (ref 70–99)

## 2020-01-17 LAB — BASIC METABOLIC PANEL
Anion gap: 12 (ref 5–15)
BUN: 93 mg/dL — ABNORMAL HIGH (ref 8–23)
CO2: 28 mmol/L (ref 22–32)
Calcium: 10.5 mg/dL — ABNORMAL HIGH (ref 8.9–10.3)
Chloride: 116 mmol/L — ABNORMAL HIGH (ref 98–111)
Creatinine, Ser: 1.93 mg/dL — ABNORMAL HIGH (ref 0.61–1.24)
GFR calc Af Amer: 42 mL/min — ABNORMAL LOW (ref >60)
GFR calc non Af Amer: 36 mL/min — ABNORMAL LOW (ref >60)
Glucose, Bld: 161 mg/dL — ABNORMAL HIGH (ref 70–99)
Potassium: 4.1 mmol/L (ref 3.5–5.1)
Sodium: 156 mmol/L — ABNORMAL HIGH (ref 135–145)

## 2020-01-17 LAB — CBC
HCT: 50.9 % (ref 39.0–52.0)
Hemoglobin: 15.6 g/dL (ref 13.0–17.0)
MCH: 33.2 pg (ref 26.0–34.0)
MCHC: 30.6 g/dL (ref 30.0–36.0)
MCV: 108.3 fL — ABNORMAL HIGH (ref 80.0–100.0)
Platelets: 199 10*3/uL (ref 150–400)
RBC: 4.7 MIL/uL (ref 4.22–5.81)
RDW: 13.2 % (ref 11.5–15.5)
WBC: 10.2 10*3/uL (ref 4.0–10.5)
nRBC: 0 % (ref 0.0–0.2)

## 2020-01-17 LAB — AMMONIA: Ammonia: 71 umol/L — ABNORMAL HIGH (ref 9–35)

## 2020-01-17 MED ORDER — ALBUMIN HUMAN 5 % IV SOLN
12.5000 g | Freq: Once | INTRAVENOUS | Status: AC
  Administered 2020-01-17: 12.5 g via INTRAVENOUS
  Filled 2020-01-17: qty 250

## 2020-01-17 MED ORDER — DEXMEDETOMIDINE HCL IN NACL 400 MCG/100ML IV SOLN
0.4000 ug/kg/h | INTRAVENOUS | Status: AC
  Administered 2020-01-17: 0.6 ug/kg/h via INTRAVENOUS
  Administered 2020-01-17: 0.4 ug/kg/h via INTRAVENOUS
  Filled 2020-01-17 (×2): qty 100

## 2020-01-17 MED ORDER — MIDAZOLAM HCL 2 MG/2ML IJ SOLN
INTRAMUSCULAR | Status: AC
  Administered 2020-01-17: 4 mg
  Filled 2020-01-17: qty 4

## 2020-01-17 MED ORDER — LORAZEPAM 2 MG/ML IJ SOLN
INTRAMUSCULAR | Status: AC
  Filled 2020-01-17: qty 1

## 2020-01-17 MED ORDER — QUETIAPINE FUMARATE 25 MG PO TABS
50.0000 mg | ORAL_TABLET | Freq: Two times a day (BID) | ORAL | Status: DC
  Administered 2020-01-17 – 2020-01-19 (×5): 50 mg
  Filled 2020-01-17 (×5): qty 2

## 2020-01-17 MED ORDER — LORAZEPAM 2 MG/ML IJ SOLN
1.0000 mg | INTRAMUSCULAR | Status: DC | PRN
  Administered 2020-01-17 – 2020-01-19 (×6): 1 mg via INTRAVENOUS
  Filled 2020-01-17 (×6): qty 1

## 2020-01-17 MED ORDER — LACTULOSE 10 GM/15ML PO SOLN
30.0000 g | Freq: Once | ORAL | Status: AC
  Administered 2020-01-17: 30 g
  Filled 2020-01-17: qty 45

## 2020-01-17 MED ORDER — FENTANYL CITRATE (PF) 100 MCG/2ML IJ SOLN
INTRAMUSCULAR | Status: AC
Start: 2020-01-17 — End: 2020-01-17
  Administered 2020-01-17: 50 ug
  Filled 2020-01-17: qty 2
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
## 2020-01-17 NOTE — Procedures (Signed)
PCCM Video Bronchoscopy Procedure Note  The patient was informed of the risks (including but not limited to bleeding, infection, respiratory failure, lung injury, tooth/oral injury) and benefits of the procedure and gave consent, see chart.  Indication: Respiratory failure, presumed mucus plugging right lung  Post Procedure Diagnosis: Severe bronchomalacia right lung  Location: [facility] Neuro/Trauma ICU  Condition pre procedure: critically ill, on BIPAP  Medications for procedure: precedex infusion, versed 4mg  IV, fentanyl IV  Procedure description: The bronchoscope was introduced through the mouth initially but could not be passed into the larynx because of the profound dried, hard secretions in the posterior tongue and pharynx.  The scope was then removed and introduced through the left nare.  Again there was significant dried, hard secretions in the pharynx which could not be removed with suction.  Eventually the larynx was located and was swollen but normal in function.  The trachea was patent in the subglottic area but near the carina there was moderate to severe tracheobronchomalacia causing > 50% obstruction of the lumen of the airway.  The left mainstem bronchus was patent without significant bronchomalacia, however the right mainstem, bronchus intermedius and all major segments of the right lung exhibited severe bronchomalacia causing dynamic complete obstruction with respiration.  There were no significant respiratory secretions.  Procedures performed: none  Specimens sent: none  Condition post procedure: critically ill, placed back on BIPAP  EBL: none immediate  Complications: none  Plan: Will need to discuss situation with family and trauma service. He has made little progress since extubation.  If no improvement in mental status and ability to sit up, clear secretions etc., it may not be possible for him to survive this without a tracheostomy.  With severe  bronchomalacia he needs positive pressure to maintain normal ventilation.  He could conceivably return to his baseline after several months of rehab.  , MD [place] PCCM Pager: [phone] Cell: [phone] If no response, call [phone]

## 2020-01-17 NOTE — Progress Notes (Signed)
SLP Cancellation Note  Patient Details Name: Roberto Lynch MRN: [id number] DOB: 07-18-1955   Cancelled treatment:       Reason Eval/Treat Not Completed: Medical issues which prohibited therapy. Pt needing BiPAP today. Will hold PO trials and continue to follow.   Mahala Menghini., M.A. CCC-SLP Acute Rehabilitation Services Pager [phone] Office [phone]  01/17/2020, 2:53 PM
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
## 2020-01-18 DIAGNOSIS — G9341 Metabolic encephalopathy: Secondary | ICD-10-CM

## 2020-01-18 DIAGNOSIS — J9602 Acute respiratory failure with hypercapnia: Secondary | ICD-10-CM | POA: Diagnosis not present

## 2020-01-18 LAB — GLUCOSE, CAPILLARY
Glucose-Capillary: 143 mg/dL — ABNORMAL HIGH (ref 70–99)
Glucose-Capillary: 149 mg/dL — ABNORMAL HIGH (ref 70–99)
Glucose-Capillary: 120 mg/dL — ABNORMAL HIGH (ref 70–99)
Glucose-Capillary: 105 mg/dL — ABNORMAL HIGH (ref 70–99)
Glucose-Capillary: 131 mg/dL — ABNORMAL HIGH (ref 70–99)
Glucose-Capillary: 170 mg/dL — ABNORMAL HIGH (ref 70–99)

## 2020-01-18 LAB — BASIC METABOLIC PANEL
Anion gap: 11 (ref 5–15)
BUN: 77 mg/dL — ABNORMAL HIGH (ref 8–23)
CO2: 34 mmol/L — ABNORMAL HIGH (ref 22–32)
Calcium: 11 mg/dL — ABNORMAL HIGH (ref 8.9–10.3)
Chloride: 116 mmol/L — ABNORMAL HIGH (ref 98–111)
Creatinine, Ser: 1.72 mg/dL — ABNORMAL HIGH (ref 0.61–1.24)
GFR calc Af Amer: 48 mL/min — ABNORMAL LOW (ref >60)
GFR calc non Af Amer: 42 mL/min — ABNORMAL LOW (ref >60)
Glucose, Bld: 121 mg/dL — ABNORMAL HIGH (ref 70–99)
Potassium: 4 mmol/L (ref 3.5–5.1)
Sodium: 161 mmol/L (ref 135–145)

## 2020-01-18 LAB — AMMONIA: Ammonia: 56 umol/L — ABNORMAL HIGH (ref 9–35)

## 2020-01-18 LAB — SODIUM: Sodium: 158 mmol/L — ABNORMAL HIGH (ref 135–145)

## 2020-01-18 MED ORDER — LACTULOSE 10 GM/15ML PO SOLN
20.0000 g | Freq: Two times a day (BID) | ORAL | Status: AC
  Administered 2020-01-18 – 2020-01-20 (×6): 20 g
  Filled 2020-01-18 (×6): qty 30

## 2020-01-18 MED ORDER — SODIUM CHLORIDE 0.45 % IV SOLN: INTRAVENOUS | Status: DC

## 2020-01-18 MED ORDER — DEXMEDETOMIDINE HCL IN NACL 400 MCG/100ML IV SOLN
0.4000 ug/kg/h | INTRAVENOUS | Status: DC
  Administered 2020-01-18: 0.6 ug/kg/h via INTRAVENOUS
  Administered 2020-01-18: 0.4 ug/kg/h via INTRAVENOUS
  Administered 2020-01-19: 0.5 ug/kg/h via INTRAVENOUS
  Administered 2020-01-19: 0.6 ug/kg/h via INTRAVENOUS
  Filled 2020-01-18 (×4): qty 100

## 2020-01-18 MED ORDER — LACTULOSE 10 GM/15ML PO SOLN: 30.0000 g | Freq: Two times a day (BID) | ORAL | Status: DC

## 2020-01-18 MED ORDER — FREE WATER
300.0000 mL | Status: DC
  Administered 2020-01-18: 300 mL

## 2020-01-18 MED ORDER — FREE WATER
300.0000 mL | Status: DC
  Administered 2020-01-18 – 2020-01-21 (×34): 300 mL
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
## 2020-01-18 NOTE — Progress Notes (Signed)
[facility] PCCM  Continued agitation, pulling at tubes lines, trying to get out of bed Frequent ativan pushes Add precedex infusion for now until tracheostomy  [place], MD [place] [facility] Pager: [phone] Cell: [phone] If no response, call [phone]

## 2020-01-18 NOTE — Progress Notes (Signed)
SLP Cancellation Note  Patient Details Name: Roberto Lynch MRN: [id number] DOB: Mar 05, 1956   Cancelled treatment:       Reason Eval/Treat Not Completed: Medical issues which prohibited therapy (pt still requiring BiPAP). Will continue to follow.    Mahala Menghini., M.A. CCC-SLP Acute Rehabilitation Services Pager [phone] Office [phone]  01/18/2020, 9:57 AM
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
## 2020-01-18 NOTE — Progress Notes (Signed)
Patient ID: Roberto Lynch, male   DOB: [birth year], 64 y.o.   MRN: [id number] Dr. Lake Bells and I met with his wife and multiple family members at the bedside to discuss the current status and plan of care.  We discussed struggles with his chronic respiratory failure as well as his liver failure.  We discussed the possibility of proceeding with tracheostomy with plans for LTAC placement and a very prolonged recovery period.  They seem amenable to this.  They are going to talk amongst themselves further.  We may be able to proceed with trach towards the end of the week should they decide accordingly.  We answered their questions.  Georganna Skeans, MD, MPH, FACS Please use [email or web address] to contact on call provider

## 2020-01-18 NOTE — Discharge Instructions (Signed)
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
## 2020-01-18 NOTE — Progress Notes (Signed)
CRITICAL VALUE ALERT  Critical Value:  Na 161  Date & Time Notied:  9/29 1427  Provider Notified: Text Page to Dr. Freida Busman  Orders Received/Actions taken: awaiting orders

## 2020-01-19 ENCOUNTER — Inpatient Hospital Stay (HOSPITAL_COMMUNITY): Payer: 59

## 2020-01-19 DIAGNOSIS — R0689 Other abnormalities of breathing: Secondary | ICD-10-CM

## 2020-01-19 DIAGNOSIS — J9622 Acute and chronic respiratory failure with hypercapnia: Secondary | ICD-10-CM | POA: Diagnosis not present

## 2020-01-19 LAB — POCT I-STAT 7, (LYTES, BLD GAS, ICA,H+H)
Acid-Base Excess: 9 mmol/L — ABNORMAL HIGH (ref 0.0–2.0)
Acid-Base Excess: 6 mmol/L — ABNORMAL HIGH (ref 0.0–2.0)
Bicarbonate: 33.6 mmol/L — ABNORMAL HIGH (ref 20.0–28.0)
Bicarbonate: 33.3 mmol/L — ABNORMAL HIGH (ref 20.0–28.0)
Calcium, Ion: 1.43 mmol/L — ABNORMAL HIGH (ref 1.15–1.40)
Calcium, Ion: 1.44 mmol/L — ABNORMAL HIGH (ref 1.15–1.40)
HCT: 44 % (ref 39.0–52.0)
HCT: 40 % (ref 39.0–52.0)
Hemoglobin: 15 g/dL (ref 13.0–17.0)
Hemoglobin: 13.6 g/dL (ref 13.0–17.0)
O2 Saturation: 96 %
O2 Saturation: 100 %
Patient temperature: 98
Potassium: 3.6 mmol/L (ref 3.5–5.1)
Potassium: 3.6 mmol/L (ref 3.5–5.1)
Sodium: 154 mmol/L — ABNORMAL HIGH (ref 135–145)
Sodium: 151 mmol/L — ABNORMAL HIGH (ref 135–145)
TCO2: 35 mmol/L — ABNORMAL HIGH (ref 22–32)
TCO2: 35 mmol/L — ABNORMAL HIGH (ref 22–32)
pCO2 arterial: 43.6 mmHg (ref 32.0–48.0)
pCO2 arterial: 56.6 mmHg — ABNORMAL HIGH (ref 32.0–48.0)
pH, Arterial: 7.493 — ABNORMAL HIGH (ref 7.350–7.450)
pH, Arterial: 7.377 (ref 7.350–7.450)
pO2, Arterial: 77 mmHg — ABNORMAL LOW (ref 83.0–108.0)
pO2, Arterial: 290 mmHg — ABNORMAL HIGH (ref 83.0–108.0)

## 2020-01-19 LAB — BASIC METABOLIC PANEL
Anion gap: 10 (ref 5–15)
BUN: 65 mg/dL — ABNORMAL HIGH (ref 8–23)
CO2: 33 mmol/L — ABNORMAL HIGH (ref 22–32)
Calcium: 10.7 mg/dL — ABNORMAL HIGH (ref 8.9–10.3)
Chloride: 112 mmol/L — ABNORMAL HIGH (ref 98–111)
Creatinine, Ser: 1.49 mg/dL — ABNORMAL HIGH (ref 0.61–1.24)
GFR calc Af Amer: 57 mL/min — ABNORMAL LOW (ref >60)
GFR calc non Af Amer: 50 mL/min — ABNORMAL LOW (ref >60)
Glucose, Bld: 146 mg/dL — ABNORMAL HIGH (ref 70–99)
Potassium: 3.8 mmol/L (ref 3.5–5.1)
Sodium: 155 mmol/L — ABNORMAL HIGH (ref 135–145)

## 2020-01-19 LAB — GLUCOSE, CAPILLARY
Glucose-Capillary: 141 mg/dL — ABNORMAL HIGH (ref 70–99)
Glucose-Capillary: 146 mg/dL — ABNORMAL HIGH (ref 70–99)
Glucose-Capillary: 147 mg/dL — ABNORMAL HIGH (ref 70–99)
Glucose-Capillary: 130 mg/dL — ABNORMAL HIGH (ref 70–99)
Glucose-Capillary: 124 mg/dL — ABNORMAL HIGH (ref 70–99)
Glucose-Capillary: 120 mg/dL — ABNORMAL HIGH (ref 70–99)

## 2020-01-19 LAB — AMMONIA: Ammonia: 59 umol/L — ABNORMAL HIGH (ref 9–35)

## 2020-01-19 MED ORDER — FENTANYL CITRATE (PF) 100 MCG/2ML IJ SOLN: 50.0000 ug | Freq: Once | INTRAMUSCULAR | Status: AC

## 2020-01-19 MED ORDER — MIDAZOLAM HCL 2 MG/2ML IJ SOLN: 2.0000 mg | Freq: Once | INTRAMUSCULAR | Status: AC

## 2020-01-19 MED ORDER — LACTATED RINGERS IV BOLUS
500.0000 mL | Freq: Once | INTRAVENOUS | Status: AC
  Administered 2020-01-19: 500 mL via INTRAVENOUS

## 2020-01-19 MED ORDER — LORAZEPAM 2 MG/ML IJ SOLN
INTRAMUSCULAR | Status: AC
  Filled 2020-01-19: qty 1

## 2020-01-19 MED ORDER — SODIUM CHLORIDE 0.9 % IV SOLN
1.0000 g | INTRAVENOUS | Status: DC
  Administered 2020-01-19: 1 g via INTRAVENOUS
  Filled 2020-01-19: qty 10
  Filled 2020-01-19: qty 1

## 2020-01-19 MED ORDER — FENTANYL BOLUS VIA INFUSION
50.0000 ug | INTRAVENOUS | Status: DC | PRN
  Administered 2020-01-19 – 2020-01-23 (×7): 50 ug via INTRAVENOUS
  Filled 2020-01-19: qty 50

## 2020-01-19 MED ORDER — FENTANYL CITRATE (PF) 100 MCG/2ML IJ SOLN
INTRAMUSCULAR | Status: AC
Start: 2020-01-19 — End: 2020-01-19
  Administered 2020-01-19: 100 ug via INTRAVENOUS
  Filled 2020-01-19: qty 2

## 2020-01-19 MED ORDER — FENTANYL CITRATE (PF) 100 MCG/2ML IJ SOLN: 100.0000 ug | Freq: Once | INTRAMUSCULAR | Status: AC

## 2020-01-19 MED ORDER — CHLORHEXIDINE GLUCONATE 0.12% ORAL RINSE (MEDLINE KIT)
15.0000 mL | Freq: Two times a day (BID) | OROMUCOSAL | Status: DC
  Administered 2020-01-19 – 2020-02-08 (×39): 15 mL via OROMUCOSAL

## 2020-01-19 MED ORDER — POLYETHYLENE GLYCOL 3350 17 G PO PACK
17.0000 g | PACK | Freq: Every day | ORAL | Status: DC
  Administered 2020-01-24 – 2020-02-05 (×5): 17 g
  Filled 2020-01-19 (×6): qty 1

## 2020-01-19 MED ORDER — MIDAZOLAM HCL 2 MG/2ML IJ SOLN
INTRAMUSCULAR | Status: AC
  Administered 2020-01-19: 2 mg via INTRAVENOUS
  Filled 2020-01-19: qty 2

## 2020-01-19 MED ORDER — MIDAZOLAM HCL 2 MG/2ML IJ SOLN
1.0000 mg | INTRAMUSCULAR | Status: DC | PRN
  Administered 2020-01-20 – 2020-01-21 (×7): 1 mg via INTRAVENOUS
  Filled 2020-01-19 (×8): qty 2

## 2020-01-19 MED ORDER — LORAZEPAM 2 MG/ML IJ SOLN
1.0000 mg | INTRAMUSCULAR | Status: DC | PRN
  Administered 2020-01-19 – 2020-01-20 (×3): 1 mg via INTRAVENOUS
  Filled 2020-01-19 (×2): qty 1

## 2020-01-19 MED ORDER — POLYETHYLENE GLYCOL 3350 17 G PO PACK: 17.0000 g | PACK | Freq: Every day | ORAL | Status: DC

## 2020-01-19 MED ORDER — PANTOPRAZOLE SODIUM 40 MG PO PACK
40.0000 mg | PACK | Freq: Every day | ORAL | Status: DC
  Administered 2020-01-19 – 2020-01-21 (×3): 40 mg
  Filled 2020-01-19 (×3): qty 20

## 2020-01-19 MED ORDER — PHENYLEPHRINE 40 MCG/ML (10ML) SYRINGE FOR IV PUSH (FOR BLOOD PRESSURE SUPPORT)
PREFILLED_SYRINGE | INTRAVENOUS | Status: AC
  Filled 2020-01-19: qty 10

## 2020-01-19 MED ORDER — QUETIAPINE FUMARATE 100 MG PO TABS
100.0000 mg | ORAL_TABLET | Freq: Two times a day (BID) | ORAL | Status: DC
  Administered 2020-01-19 – 2020-01-22 (×7): 100 mg
  Filled 2020-01-19 (×8): qty 1

## 2020-01-19 MED ORDER — MIDAZOLAM HCL 2 MG/2ML IJ SOLN: 1.0000 mg | INTRAMUSCULAR | Status: DC | PRN

## 2020-01-19 MED ORDER — ETOMIDATE 2 MG/ML IV SOLN
20.0000 mg | Freq: Once | INTRAVENOUS | Status: AC
  Administered 2020-01-19: 20 mg via INTRAVENOUS

## 2020-01-19 MED ORDER — FENTANYL 2500MCG IN NS 250ML (10MCG/ML) PREMIX INFUSION
50.0000 ug/h | INTRAVENOUS | Status: DC
  Administered 2020-01-19: 50 ug/h via INTRAVENOUS
  Administered 2020-01-20 – 2020-01-23 (×7): 200 ug/h via INTRAVENOUS
  Filled 2020-01-19 (×8): qty 250

## 2020-01-19 MED ORDER — ORAL CARE MOUTH RINSE
15.0000 mL | OROMUCOSAL | Status: DC
  Administered 2020-01-19 – 2020-02-03 (×148): 15 mL via OROMUCOSAL

## 2020-01-19 NOTE — Progress Notes (Signed)
  Patient with ongoing lower SBP with MAP still above 65 since intubation.  S/p LR 500 ml bolus with marginal improvement.  Fentanyl gtt is at 100 mcg/hr with RASS -2.    Blood pressure (!) 88/66 (74), pulse (!) 52, temperature 98.5 F (36.9 C), temperature source Axillary, resp. rate 18, height 5\' 9"  (1.753 m), weight 116 kg, SpO2 100 %.  Post intubation, patient had copious thick/ yellow secretions.  CXR showed good placement of ETT with unchanged volume loss in the right hemithorax with patchy opacity in the right base, pna vs atelectasis.   Question developing sepsis.   P: Additional LR 500 ml bolus now MAP goal 65, may need low dose peripheral pressors (net + 6.7L) Send sputum cx Check CBC/ PCT Empiric aspiration/ PNA coverage w/ ceftriaxone     , ACNP [facility] 01/19/2020, 5:45 PM  See Amion for personal pager PCCM on call pager [phone]
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
## 2020-01-19 NOTE — Progress Notes (Signed)
Wife, Elinor Dodge, updated by phone on events and intubation.  All questions answered.      Posey Boyer, ACNP [facility] 01/19/2020, 3:29 PM  See Amion for personal pager PCCM on call pager [phone]

## 2020-01-19 NOTE — Procedures (Signed)
Intubation Procedure Note  Roberto Lynch  [id number]  [birth year]  Date:01/19/20  Time:3:09 PM   Provider Performing:Akyra Bouchie L Rust-Chester    Procedure: Intubation (31500)  Indication(s) Respiratory Failure  Consent Risks of the procedure as well as the alternatives and risks of each were explained to the patient and/or caregiver.  Consent for the procedure was obtained and is signed in the bedside chart   Anesthesia Fentanyl, Versed, Etomidate   Time Out Verified patient identification, verified procedure, site/side was marked, verified correct patient position, special equipment/implants available, medications/allergies/relevant history reviewed, required imaging and test results available.   Sterile Technique Usual hand hygeine, masks, and gloves were used   Procedure Description Patient positioned in bed supine.  Sedation given as noted above.  Patient was intubated with 8.0 endotracheal tube at 26 at the lip using Mac 3 Glidescope.  View was Grade 1 full glottis .  Number of attempts was 1.  Colorimetric CO2 detector was consistent with tracheal placement.   Complications/Tolerance None; patient tolerated the procedure well. Chest X-ray is ordered to verify placement.   EBL none   Specimen(s) None  [name] Rust-Chester, AGACNP-BC [facility]    Please see Amion for pager details.

## 2020-01-19 NOTE — Progress Notes (Signed)
Patient ID: Roberto Lynch, male   DOB: Oct 26, 1955, 64 y.o.   MRN: [id number] I spoke with his wife again about possible trach/LTACH. They have decided to go ahead with it. He is on Eliquis so we will hold the Eliquis Saturday and plan trach Monday.  Violeta Gelinas, MD, MPH, FACS Please use [email or web address] to contact on call provider
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
## 2020-01-20 ENCOUNTER — Inpatient Hospital Stay (HOSPITAL_COMMUNITY): Payer: 59

## 2020-01-20 DIAGNOSIS — J9809 Other diseases of bronchus, not elsewhere classified: Secondary | ICD-10-CM | POA: Diagnosis not present

## 2020-01-20 DIAGNOSIS — J9621 Acute and chronic respiratory failure with hypoxia: Secondary | ICD-10-CM | POA: Diagnosis not present

## 2020-01-20 DIAGNOSIS — G4733 Obstructive sleep apnea (adult) (pediatric): Secondary | ICD-10-CM

## 2020-01-20 DIAGNOSIS — J690 Pneumonitis due to inhalation of food and vomit: Secondary | ICD-10-CM

## 2020-01-20 DIAGNOSIS — J449 Chronic obstructive pulmonary disease, unspecified: Secondary | ICD-10-CM

## 2020-01-20 DIAGNOSIS — E662 Morbid (severe) obesity with alveolar hypoventilation: Secondary | ICD-10-CM | POA: Diagnosis not present

## 2020-01-20 LAB — GLUCOSE, CAPILLARY
Glucose-Capillary: 115 mg/dL — ABNORMAL HIGH (ref 70–99)
Glucose-Capillary: 110 mg/dL — ABNORMAL HIGH (ref 70–99)
Glucose-Capillary: 121 mg/dL — ABNORMAL HIGH (ref 70–99)
Glucose-Capillary: 139 mg/dL — ABNORMAL HIGH (ref 70–99)
Glucose-Capillary: 122 mg/dL — ABNORMAL HIGH (ref 70–99)
Glucose-Capillary: 132 mg/dL — ABNORMAL HIGH (ref 70–99)

## 2020-01-20 LAB — CBC
HCT: 45.6 % (ref 39.0–52.0)
HCT: 43.5 % (ref 39.0–52.0)
Hemoglobin: 13.6 g/dL (ref 13.0–17.0)
Hemoglobin: 13.3 g/dL (ref 13.0–17.0)
MCH: 32 pg (ref 26.0–34.0)
MCH: 33.1 pg (ref 26.0–34.0)
MCHC: 29.8 g/dL — ABNORMAL LOW (ref 30.0–36.0)
MCHC: 30.6 g/dL (ref 30.0–36.0)
MCV: 107.3 fL — ABNORMAL HIGH (ref 80.0–100.0)
MCV: 108.2 fL — ABNORMAL HIGH (ref 80.0–100.0)
Platelets: 156 10*3/uL (ref 150–400)
Platelets: 142 10*3/uL — ABNORMAL LOW (ref 150–400)
RBC: 4.25 MIL/uL (ref 4.22–5.81)
RBC: 4.02 MIL/uL — ABNORMAL LOW (ref 4.22–5.81)
RDW: 13.2 % (ref 11.5–15.5)
RDW: 13.1 % (ref 11.5–15.5)
WBC: 9.1 10*3/uL (ref 4.0–10.5)
WBC: 8.3 10*3/uL (ref 4.0–10.5)
nRBC: 0.2 % (ref 0.0–0.2)
nRBC: 0 % (ref 0.0–0.2)

## 2020-01-20 LAB — PROCALCITONIN
Procalcitonin: <0.1 ng/mL
Procalcitonin: <0.1 ng/mL

## 2020-01-20 LAB — RENAL FUNCTION PANEL
Albumin: 2.6 g/dL — ABNORMAL LOW (ref 3.5–5.0)
Anion gap: 13 (ref 5–15)
BUN: 44 mg/dL — ABNORMAL HIGH (ref 8–23)
CO2: 28 mmol/L (ref 22–32)
Calcium: 10.1 mg/dL (ref 8.9–10.3)
Chloride: 108 mmol/L (ref 98–111)
Creatinine, Ser: 1.22 mg/dL (ref 0.61–1.24)
GFR calc Af Amer: >60 mL/min (ref >60)
GFR calc non Af Amer: >60 mL/min (ref >60)
Glucose, Bld: 120 mg/dL — ABNORMAL HIGH (ref 70–99)
Phosphorus: 3.9 mg/dL (ref 2.5–4.6)
Potassium: 3.4 mmol/L — ABNORMAL LOW (ref 3.5–5.1)
Sodium: 149 mmol/L — ABNORMAL HIGH (ref 135–145)

## 2020-01-20 MED ORDER — SODIUM CHLORIDE 0.9 % IV SOLN
2.0000 g | INTRAVENOUS | Status: DC
  Administered 2020-01-20 – 2020-01-22 (×3): 2 g via INTRAVENOUS
  Filled 2020-01-20 (×2): qty 20
  Filled 2020-01-20: qty 2
  Filled 2020-01-20: qty 20

## 2020-01-20 MED ORDER — POTASSIUM CHLORIDE 20 MEQ/15ML (10%) PO SOLN
40.0000 meq | ORAL | Status: AC
  Administered 2020-01-20 (×2): 40 meq
  Filled 2020-01-20: qty 30

## 2020-01-20 MED ORDER — POTASSIUM CHLORIDE 20 MEQ/15ML (10%) PO SOLN
40.0000 meq | ORAL | Status: DC
  Filled 2020-01-20: qty 30

## 2020-01-20 MED ORDER — HEPARIN (PORCINE) 25000 UT/250ML-% IV SOLN
2300.0000 [IU]/h | INTRAVENOUS | Status: DC
  Filled 2020-01-20: qty 250

## 2020-01-20 MED ORDER — BUDESONIDE 0.5 MG/2ML IN SUSP
0.5000 mg | Freq: Two times a day (BID) | RESPIRATORY_TRACT | Status: DC
  Administered 2020-01-20 – 2020-02-03 (×26): 0.5 mg via RESPIRATORY_TRACT
  Filled 2020-01-20 (×29): qty 2

## 2020-01-20 NOTE — Progress Notes (Signed)
ANTICOAGULATION CONSULT NOTE - Follow Up Consult  Pharmacy Consult for Heparin (while Apixaban on hold) Indication: atrial fibrillation  Patient Measurements: Height: 5\' 9"  (175.3 cm) Weight: 116 kg (255 lb 11.7 oz) IBW/kg (Calculated) : 70.7 Heparin Dosing Weight: 96 kg  Vital Signs: Temp: 99.7 F (37.6 C) (10/01 0800) Temp Source: Axillary (10/01 0800) BP: 105/84 (10/01 0800) Pulse Rate: 68 (10/01 0800)  Labs: Recent Labs    01/17/20 1217 01/17/20 1217 01/18/20 1344 01/19/20 0420 01/19/20 0832 01/19/20 1644 01/19/20 1644 01/19/20 2310 01/20/20 0537  HGB 15.6  --   --   --    < > 13.6   < > 13.6 13.3  HCT 50.9  --   --   --    < > 40.0  --  45.6 43.5  PLT 199  --   --   --   --   --   --  156 142*  CREATININE 1.93*   < > 1.72* 1.49*  --   --   --   --  1.22   < > = values in this interval not displayed.    Estimated Creatinine Clearance: 78.9 mL/min (by C-G formula based on SCr of 1.22 mg/dL).  Assessment: 64 YOM on Apixaban for new Afib this admission - holding for planned trach on Mon, 10/4. Last Apixaban dose planned from 10/1 PM. Pharmacy consulted to start Heparin bridge on 10/2 - will initiate ~12 hours after the last Apixaban dose.   The patient was on heparin earlier this admission with therapeutic levels on drip rates ranging from 2300 units/hr to 2800 units/hr. Will use this to guide the initial start rate. Will have to use aPTTs initially to guide therapy given false elevation of HLs with Apixaban use.   Goal of Therapy:  Heparin level 0.3-0.7 units/ml Monitor platelets by anticoagulation protocol: Yes   Plan:  - Start Heparin at a drip rate of 2300 units/hr on 10/2 at 1000 (~12h after last Apixaban dose) - Planning bridge to trach on Mon, 10/4 - Will follow-up anticoagulation plans post-procedure - Will continue to monitor for any signs/symptoms of bleeding and will follow up with aPTT level in 6 hours after starting  Thank you for allowing pharmacy  to be a part of this patient's care.  12/4, PharmD, BCPS Clinical Pharmacist Clinical phone for 01/20/2020: 03/21/2020 01/20/2020 9:31 AM   **Pharmacist phone directory can now be found on [email or web address] ([email or web address]).  Listed under [facility].
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
## 2020-01-20 NOTE — Progress Notes (Signed)
PT Cancellation Note  Patient Details Name: Roberto Lynch MRN: [id number] DOB: 1956/03/27   Cancelled Treatment:    Reason Eval/Treat Not Completed: Medical issues which prohibited therapy. Pt intubated yesterday. Remains intubated and sedated. PT will follow up on Monday.   Arlyss Gandy 01/20/2020, 10:48 AM
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
## 2020-01-20 NOTE — Progress Notes (Signed)
OT Cancellation Note  Patient Details Name: Roberto Lynch MRN: [id number] DOB: Jul 04, 1955   Cancelled Treatment:    Reason Eval/Treat Not Completed: Medical issues which prohibited therapy.  Intubated and sedated.  Eber Jones., OTR/L Acute Rehabilitation Services Pager [phone] Office [phone]   Jeani Hawking M 01/20/2020, 2:11 PM

## 2020-01-20 NOTE — Progress Notes (Signed)
SLP Cancellation Note  Patient Details Name: Roberto Lynch MRN: [id number] DOB: May 13, 1955   Cancelled treatment:       Reason Eval/Treat Not Completed: Medical issues which prohibited therapy. Pt now intubated. Will /fu for readiness.    Shantana Christon, Riley Nearing 01/20/2020, 7:30 AM

## 2020-01-21 ENCOUNTER — Inpatient Hospital Stay (HOSPITAL_COMMUNITY): Payer: 59

## 2020-01-21 DIAGNOSIS — J449 Chronic obstructive pulmonary disease, unspecified: Secondary | ICD-10-CM | POA: Diagnosis not present

## 2020-01-21 DIAGNOSIS — G4730 Sleep apnea, unspecified: Secondary | ICD-10-CM

## 2020-01-21 DIAGNOSIS — J690 Pneumonitis due to inhalation of food and vomit: Secondary | ICD-10-CM | POA: Diagnosis not present

## 2020-01-21 DIAGNOSIS — J9621 Acute and chronic respiratory failure with hypoxia: Secondary | ICD-10-CM | POA: Diagnosis not present

## 2020-01-21 LAB — CBC
HCT: 44.7 % (ref 39.0–52.0)
HCT: 40.9 % (ref 39.0–52.0)
HCT: 40 % (ref 39.0–52.0)
Hemoglobin: 13.7 g/dL (ref 13.0–17.0)
Hemoglobin: 12.3 g/dL — ABNORMAL LOW (ref 13.0–17.0)
Hemoglobin: 12.1 g/dL — ABNORMAL LOW (ref 13.0–17.0)
MCH: 32.9 pg (ref 26.0–34.0)
MCH: 32.2 pg (ref 26.0–34.0)
MCH: 32.3 pg (ref 26.0–34.0)
MCHC: 30.6 g/dL (ref 30.0–36.0)
MCHC: 30.1 g/dL (ref 30.0–36.0)
MCHC: 30.3 g/dL (ref 30.0–36.0)
MCV: 107.2 fL — ABNORMAL HIGH (ref 80.0–100.0)
MCV: 107.1 fL — ABNORMAL HIGH (ref 80.0–100.0)
MCV: 106.7 fL — ABNORMAL HIGH (ref 80.0–100.0)
Platelets: 140 10*3/uL — ABNORMAL LOW (ref 150–400)
Platelets: 148 10*3/uL — ABNORMAL LOW (ref 150–400)
Platelets: 134 10*3/uL — ABNORMAL LOW (ref 150–400)
RBC: 4.17 MIL/uL — ABNORMAL LOW (ref 4.22–5.81)
RBC: 3.82 MIL/uL — ABNORMAL LOW (ref 4.22–5.81)
RBC: 3.75 MIL/uL — ABNORMAL LOW (ref 4.22–5.81)
RDW: 13.2 % (ref 11.5–15.5)
RDW: 13.2 % (ref 11.5–15.5)
RDW: 13.1 % (ref 11.5–15.5)
WBC: 8.2 10*3/uL (ref 4.0–10.5)
WBC: 7.8 10*3/uL (ref 4.0–10.5)
WBC: 7.7 10*3/uL (ref 4.0–10.5)
nRBC: 0 % (ref 0.0–0.2)
nRBC: 0 % (ref 0.0–0.2)
nRBC: 0 % (ref 0.0–0.2)

## 2020-01-21 LAB — BASIC METABOLIC PANEL
Anion gap: 9 (ref 5–15)
BUN: 30 mg/dL — ABNORMAL HIGH (ref 8–23)
CO2: 31 mmol/L (ref 22–32)
Calcium: 10.5 mg/dL — ABNORMAL HIGH (ref 8.9–10.3)
Chloride: 106 mmol/L (ref 98–111)
Creatinine, Ser: 1.19 mg/dL (ref 0.61–1.24)
GFR calc Af Amer: >60 mL/min (ref >60)
GFR calc non Af Amer: >60 mL/min (ref >60)
Glucose, Bld: 127 mg/dL — ABNORMAL HIGH (ref 70–99)
Potassium: 4.6 mmol/L (ref 3.5–5.1)
Sodium: 146 mmol/L — ABNORMAL HIGH (ref 135–145)

## 2020-01-21 LAB — GLUCOSE, CAPILLARY
Glucose-Capillary: 131 mg/dL — ABNORMAL HIGH (ref 70–99)
Glucose-Capillary: 123 mg/dL — ABNORMAL HIGH (ref 70–99)
Glucose-Capillary: 132 mg/dL — ABNORMAL HIGH (ref 70–99)
Glucose-Capillary: 110 mg/dL — ABNORMAL HIGH (ref 70–99)
Glucose-Capillary: 99 mg/dL (ref 70–99)
Glucose-Capillary: 97 mg/dL (ref 70–99)

## 2020-01-21 LAB — CULTURE, BLOOD (ROUTINE X 2)
Culture: NO GROWTH
Culture: NO GROWTH
Special Requests: ADEQUATE
Special Requests: ADEQUATE

## 2020-01-21 LAB — TYPE AND SCREEN
ABO/RH(D): A NEG
Antibody Screen: NEGATIVE

## 2020-01-21 LAB — PROCALCITONIN: Procalcitonin: <0.1 ng/mL

## 2020-01-21 LAB — APTT: aPTT: 28 s (ref 24–36)

## 2020-01-21 LAB — HEPARIN LEVEL (UNFRACTIONATED): Heparin Unfractionated: 1.92 [IU]/mL — ABNORMAL HIGH (ref 0.30–0.70)

## 2020-01-21 MED ORDER — MIDAZOLAM HCL 2 MG/2ML IJ SOLN
2.0000 mg | INTRAMUSCULAR | Status: DC | PRN
  Administered 2020-01-22 – 2020-01-27 (×34): 2 mg via INTRAVENOUS
  Filled 2020-01-21 (×35): qty 2

## 2020-01-21 MED ORDER — PANTOPRAZOLE SODIUM 40 MG IV SOLR
40.0000 mg | Freq: Two times a day (BID) | INTRAVENOUS | Status: DC
  Administered 2020-01-21 – 2020-01-22 (×2): 40 mg via INTRAVENOUS
  Filled 2020-01-21 (×2): qty 40

## 2020-01-21 NOTE — Progress Notes (Signed)
eLink Physician-Brief Progress Note Patient Name: [name] DOB: 10-23-1955 MRN: [id number]   Date of Service  01/21/2020  HPI/Events of Note  RN requests additional sedation. Currently ordered for fentanyl and versed 1mg  IV push Q2H. Versed works but wears off very quickly per . Not on propofol b/c he gets quite hypotensive.   eICU Interventions  Increase versed push dose to 2mg . Keep frequency the same.     Intervention Category Minor Interventions: Agitation / anxiety - evaluation and management  [employer] 01/21/2020, 9:42 PM
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
## 2020-01-22 DIAGNOSIS — J9621 Acute and chronic respiratory failure with hypoxia: Secondary | ICD-10-CM | POA: Diagnosis not present

## 2020-01-22 DIAGNOSIS — J9622 Acute and chronic respiratory failure with hypercapnia: Secondary | ICD-10-CM | POA: Diagnosis not present

## 2020-01-22 DIAGNOSIS — J690 Pneumonitis due to inhalation of food and vomit: Secondary | ICD-10-CM | POA: Diagnosis not present

## 2020-01-22 LAB — GLUCOSE, CAPILLARY
Glucose-Capillary: 102 mg/dL — ABNORMAL HIGH (ref 70–99)
Glucose-Capillary: 91 mg/dL (ref 70–99)
Glucose-Capillary: 87 mg/dL (ref 70–99)
Glucose-Capillary: 81 mg/dL (ref 70–99)
Glucose-Capillary: 99 mg/dL (ref 70–99)
Glucose-Capillary: 96 mg/dL (ref 70–99)

## 2020-01-22 LAB — CULTURE, RESPIRATORY W GRAM STAIN: Culture: NORMAL

## 2020-01-22 LAB — BASIC METABOLIC PANEL
Anion gap: 10 (ref 5–15)
BUN: 24 mg/dL — ABNORMAL HIGH (ref 8–23)
CO2: 25 mmol/L (ref 22–32)
Calcium: 10.2 mg/dL (ref 8.9–10.3)
Chloride: 108 mmol/L (ref 98–111)
Creatinine, Ser: 1.08 mg/dL (ref 0.61–1.24)
GFR calc Af Amer: >60 mL/min (ref >60)
GFR calc non Af Amer: >60 mL/min (ref >60)
Glucose, Bld: 98 mg/dL (ref 70–99)
Potassium: 4.3 mmol/L (ref 3.5–5.1)
Sodium: 143 mmol/L (ref 135–145)

## 2020-01-22 LAB — AMMONIA: Ammonia: 39 umol/L — ABNORMAL HIGH (ref 9–35)

## 2020-01-22 MED ORDER — OSMOLITE 1.5 CAL PO LIQD
1000.0000 mL | ORAL | Status: DC
  Administered 2020-01-22 – 2020-01-26 (×4): 1000 mL
  Filled 2020-01-22 (×2): qty 1000

## 2020-01-22 MED ORDER — DEXMEDETOMIDINE HCL IN NACL 400 MCG/100ML IV SOLN
0.0000 ug/kg/h | INTRAVENOUS | Status: AC
  Administered 2020-01-22: 0.2 ug/kg/h via INTRAVENOUS
  Administered 2020-01-22: 0.4 ug/kg/h via INTRAVENOUS
  Administered 2020-01-23: 0.3 ug/kg/h via INTRAVENOUS
  Administered 2020-01-24: 0.6 ug/kg/h via INTRAVENOUS
  Administered 2020-01-24: 1.2 ug/kg/h via INTRAVENOUS
  Administered 2020-01-24: 1 ug/kg/h via INTRAVENOUS
  Administered 2020-01-24: 0.5 ug/kg/h via INTRAVENOUS
  Filled 2020-01-22 (×7): qty 100

## 2020-01-22 MED ORDER — PANTOPRAZOLE SODIUM 40 MG PO PACK
40.0000 mg | PACK | Freq: Two times a day (BID) | ORAL | Status: DC
  Administered 2020-01-22 – 2020-02-05 (×29): 40 mg
  Filled 2020-01-22 (×30): qty 20

## 2020-01-22 MED ORDER — FREE WATER
200.0000 mL | Freq: Four times a day (QID) | Status: DC
  Administered 2020-01-22 – 2020-01-24 (×5): 200 mL

## 2020-01-22 MED ORDER — ENOXAPARIN SODIUM 30 MG/0.3ML ~~LOC~~ SOLN
30.0000 mg | Freq: Two times a day (BID) | SUBCUTANEOUS | Status: DC
  Administered 2020-01-22 (×2): 30 mg via SUBCUTANEOUS
  Filled 2020-01-22 (×3): qty 0.3

## 2020-01-22 NOTE — Plan of Care (Signed)
  Problem: Clinical Measurements: Goal: Ability to maintain clinical measurements within normal limits will improve Outcome: Progressing   
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
## 2020-01-22 NOTE — Progress Notes (Signed)
VAST consult to obtain IV access. VAST RN attempted X2 to place IV unsuccessfully. Pt reportedly agitated. 2nd VAST RN called unit and requested unit RN give patient medication for agitation so further assessment with ultrasound can be completed. Unit RN verbalized understanding.

## 2020-01-23 ENCOUNTER — Inpatient Hospital Stay (HOSPITAL_COMMUNITY): Payer: 59 | Admitting: Certified Registered"

## 2020-01-23 ENCOUNTER — Encounter (HOSPITAL_COMMUNITY): Admission: EM | Disposition: A | Discharge status: Rehabilitation Facility | Payer: Self-pay | Source: Home / Self Care

## 2020-01-23 ENCOUNTER — Inpatient Hospital Stay (HOSPITAL_COMMUNITY): Payer: 59

## 2020-01-23 DIAGNOSIS — N179 Acute kidney failure, unspecified: Secondary | ICD-10-CM | POA: Diagnosis not present

## 2020-01-23 DIAGNOSIS — S20212A Contusion of left front wall of thorax, initial encounter: Secondary | ICD-10-CM | POA: Diagnosis not present

## 2020-01-23 DIAGNOSIS — J9601 Acute respiratory failure with hypoxia: Secondary | ICD-10-CM | POA: Diagnosis not present

## 2020-01-23 DIAGNOSIS — R0600 Dyspnea, unspecified: Secondary | ICD-10-CM | POA: Diagnosis not present

## 2020-01-23 DIAGNOSIS — R0689 Other abnormalities of breathing: Secondary | ICD-10-CM

## 2020-01-23 DIAGNOSIS — R0602 Shortness of breath: Secondary | ICD-10-CM

## 2020-01-23 HISTORY — PX: TRACHEOSTOMY TUBE PLACEMENT: SHX814

## 2020-01-23 HISTORY — PX: PEG PLACEMENT: SHX5437

## 2020-01-23 LAB — PROTIME-INR
INR: 1.1 (ref 0.8–1.2)
Prothrombin Time: 13.5 seconds (ref 11.4–15.2)

## 2020-01-23 LAB — BASIC METABOLIC PANEL
Anion gap: 10 (ref 5–15)
BUN: 16 mg/dL (ref 8–23)
CO2: 27 mmol/L (ref 22–32)
Calcium: 10 mg/dL (ref 8.9–10.3)
Chloride: 108 mmol/L (ref 98–111)
Creatinine, Ser: 1.1 mg/dL (ref 0.61–1.24)
GFR calc Af Amer: >60 mL/min (ref >60)
GFR calc non Af Amer: >60 mL/min (ref >60)
Glucose, Bld: 104 mg/dL — ABNORMAL HIGH (ref 70–99)
Potassium: 3.9 mmol/L (ref 3.5–5.1)
Sodium: 145 mmol/L (ref 135–145)

## 2020-01-23 LAB — CBC
HCT: 42.4 % (ref 39.0–52.0)
Hemoglobin: 12.9 g/dL — ABNORMAL LOW (ref 13.0–17.0)
MCH: 32.1 pg (ref 26.0–34.0)
MCHC: 30.4 g/dL (ref 30.0–36.0)
MCV: 105.5 fL — ABNORMAL HIGH (ref 80.0–100.0)
Platelets: 143 10*3/uL — ABNORMAL LOW (ref 150–400)
RBC: 4.02 MIL/uL — ABNORMAL LOW (ref 4.22–5.81)
RDW: 13 % (ref 11.5–15.5)
WBC: 6.5 10*3/uL (ref 4.0–10.5)
nRBC: 0.3 % — ABNORMAL HIGH (ref 0.0–0.2)

## 2020-01-23 LAB — GLUCOSE, CAPILLARY
Glucose-Capillary: 87 mg/dL (ref 70–99)
Glucose-Capillary: 108 mg/dL — ABNORMAL HIGH (ref 70–99)
Glucose-Capillary: 104 mg/dL — ABNORMAL HIGH (ref 70–99)
Glucose-Capillary: 104 mg/dL — ABNORMAL HIGH (ref 70–99)
Glucose-Capillary: 104 mg/dL — ABNORMAL HIGH (ref 70–99)
Glucose-Capillary: 117 mg/dL — ABNORMAL HIGH (ref 70–99)

## 2020-01-23 LAB — APTT
aPTT: 32 seconds (ref 24–36)
aPTT: 41 s — ABNORMAL HIGH (ref 24–36)

## 2020-01-23 LAB — HEPARIN LEVEL (UNFRACTIONATED): Heparin Unfractionated: 0.25 [IU]/mL — ABNORMAL LOW (ref 0.30–0.70)

## 2020-01-23 SURGERY — CREATION, TRACHEOSTOMY
Anesthesia: General | Site: Neck

## 2020-01-23 MED ORDER — FENTANYL CITRATE (PF) 250 MCG/5ML IJ SOLN
INTRAMUSCULAR | Status: AC
  Filled 2020-01-23: qty 5

## 2020-01-23 MED ORDER — HEPARIN (PORCINE) 25000 UT/250ML-% IV SOLN
2450.0000 [IU]/h | INTRAVENOUS | Status: AC
  Administered 2020-01-23: 1600 [IU]/h via INTRAVENOUS
  Administered 2020-01-24: 1800 [IU]/h via INTRAVENOUS
  Administered 2020-01-25: 2300 [IU]/h via INTRAVENOUS
  Administered 2020-01-25 – 2020-01-26 (×2): 2450 [IU]/h via INTRAVENOUS
  Filled 2020-01-23 (×6): qty 250

## 2020-01-23 MED ORDER — LACTATED RINGERS IV SOLN: INTRAVENOUS | Status: DC | PRN

## 2020-01-23 MED ORDER — PROPOFOL 10 MG/ML IV BOLUS
INTRAVENOUS | Status: DC | PRN
  Administered 2020-01-23: 50 mg via INTRAVENOUS

## 2020-01-23 MED ORDER — LIDOCAINE-EPINEPHRINE 1.5 %-1:200,000 OPTIME - NO CHARGE
INTRAMUSCULAR | Status: DC | PRN
  Administered 2020-01-23: .1 mL via SUBCUTANEOUS

## 2020-01-23 MED ORDER — PHENYLEPHRINE HCL-NACL 10-0.9 MG/250ML-% IV SOLN
INTRAVENOUS | Status: DC | PRN
  Administered 2020-01-23: 30 ug/min via INTRAVENOUS

## 2020-01-23 MED ORDER — METOPROLOL TARTRATE 5 MG/5ML IV SOLN
5.0000 mg | Freq: Once | INTRAVENOUS | Status: AC | PRN
  Administered 2020-01-24: 5 mg via INTRAVENOUS
  Filled 2020-01-23: qty 5

## 2020-01-23 MED ORDER — QUETIAPINE FUMARATE 200 MG PO TABS: 200.0000 mg | ORAL_TABLET | Freq: Two times a day (BID) | ORAL | Status: DC

## 2020-01-23 MED ORDER — MIDAZOLAM HCL 5 MG/5ML IJ SOLN
INTRAMUSCULAR | Status: DC | PRN
  Administered 2020-01-23: 2 mg via INTRAVENOUS

## 2020-01-23 MED ORDER — MIDAZOLAM HCL 2 MG/2ML IJ SOLN
INTRAMUSCULAR | Status: AC
  Filled 2020-01-23: qty 2

## 2020-01-23 MED ORDER — QUETIAPINE FUMARATE 200 MG PO TABS
200.0000 mg | ORAL_TABLET | Freq: Two times a day (BID) | ORAL | Status: DC
  Administered 2020-01-23 (×2): 200 mg
  Filled 2020-01-23 (×2): qty 1

## 2020-01-23 MED ORDER — QUETIAPINE FUMARATE 100 MG PO TABS: 100.0000 mg | ORAL_TABLET | Freq: Once | ORAL | Status: DC

## 2020-01-23 MED ORDER — ROCURONIUM BROMIDE 10 MG/ML (PF) SYRINGE
PREFILLED_SYRINGE | INTRAVENOUS | Status: DC | PRN
  Administered 2020-01-23: 60 mg via INTRAVENOUS
  Administered 2020-01-23: 50 mg via INTRAVENOUS
  Administered 2020-01-23: 40 mg via INTRAVENOUS

## 2020-01-23 MED ORDER — 0.9 % SODIUM CHLORIDE (POUR BTL) OPTIME
TOPICAL | Status: DC | PRN
  Administered 2020-01-23: 1000 mL

## 2020-01-23 MED ORDER — EPHEDRINE SULFATE-NACL 50-0.9 MG/10ML-% IV SOSY
PREFILLED_SYRINGE | INTRAVENOUS | Status: DC | PRN
  Administered 2020-01-23 (×2): 10 mg via INTRAVENOUS

## 2020-01-23 SURGICAL SUPPLY — 32 items
BLADE CLIPPER SURG (BLADE) IMPLANT
CANISTER SUCT 3000ML PPV (MISCELLANEOUS) ×3 IMPLANT
COVER SURGICAL LIGHT HANDLE (MISCELLANEOUS) ×3 IMPLANT
COVER WAND RF STERILE (DRAPES) ×3 IMPLANT
DRAPE UTILITY XL STRL (DRAPES) ×3 IMPLANT
DRSG TEGADERM 4X4.75 (GAUZE/BANDAGES/DRESSINGS) ×3 IMPLANT
DRSG TELFA 3X8 NADH (GAUZE/BANDAGES/DRESSINGS) ×3 IMPLANT
ELECT CAUTERY BLADE 6.4 (BLADE) ×3 IMPLANT
ELECT REM PT RETURN 9FT ADLT (ELECTROSURGICAL) ×3
ELECTRODE REM PT RTRN 9FT ADLT (ELECTROSURGICAL) ×2 IMPLANT
GAUZE 4X4 16PLY RFD (DISPOSABLE) ×3 IMPLANT
GLOVE BIO SURGEON STRL SZ 6.5 (GLOVE) ×3 IMPLANT
GLOVE BIOGEL PI IND STRL 6 (GLOVE) ×2 IMPLANT
GLOVE BIOGEL PI INDICATOR 6 (GLOVE) ×1
GOWN STRL REUS W/ TWL LRG LVL3 (GOWN DISPOSABLE) ×4 IMPLANT
GOWN STRL REUS W/TWL LRG LVL3 (GOWN DISPOSABLE) ×6
INTRODUCER TRACH BLUE RHINO 8F (TUBING) ×3 IMPLANT
KIT BASIN OR (CUSTOM PROCEDURE TRAY) ×3 IMPLANT
KIT TURNOVER KIT B (KITS) ×3 IMPLANT
NS IRRIG 1000ML POUR BTL (IV SOLUTION) ×3 IMPLANT
PACK EENT II TURBAN DRAPE (CUSTOM PROCEDURE TRAY) ×3 IMPLANT
PAD ARMBOARD 7.5X6 YLW CONV (MISCELLANEOUS) ×6 IMPLANT
PENCIL BUTTON HOLSTER BLD 10FT (ELECTRODE) ×3 IMPLANT
SPONGE INTESTINAL PEANUT (DISPOSABLE) ×3 IMPLANT
SUT SILK 2 0 SH CR/8 (SUTURE) ×3 IMPLANT
SUT VIC AB 2-0 SH 27 (SUTURE) ×6
SUT VIC AB 2-0 SH 27X BRD (SUTURE) ×4 IMPLANT
SUT VICRYL AB 3 0 TIES (SUTURE) ×3 IMPLANT
SYR 20ML LL LF (SYRINGE) ×3 IMPLANT
TOWEL GREEN STERILE (TOWEL DISPOSABLE) ×3 IMPLANT
TOWEL GREEN STERILE FF (TOWEL DISPOSABLE) ×3 IMPLANT
TUBE CONNECTING 12X1/4 (SUCTIONS) ×3 IMPLANT
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
## 2020-01-23 NOTE — Transfer of Care (Signed)
Immediate Anesthesia Transfer of Care Note  Patient: Roberto Lynch  Procedure(s) Performed: TRACHEOSTOMY (N/A Neck) PERCUTANEOUS ENDOSCOPIC GASTROSTOMY (PEG) PLACEMENT (N/A Abdomen)  Patient Location: ICU  Anesthesia Type:General  Level of Consciousness: Patient remains intubated per anesthesia plan  Airway & Oxygen Therapy: Patient remains intubated per anesthesia plan and Patient placed on Ventilator (see vital sign flow sheet for setting)  Post-op Assessment: Report given to RN and Post -op Vital signs reviewed and stable  Post vital signs: Reviewed and stable  Last Vitals:  Vitals Value Taken Time  BP    Temp    Pulse 70 01/23/20 1017  Resp 18 01/23/20 1017  SpO2 93 % 01/23/20 1017    Last Pain:  Vitals:   01/23/20 0800  TempSrc: Axillary  PainSc:          Complications: No complications documented.

## 2020-01-23 NOTE — Progress Notes (Signed)
PT Cancellation Note  Patient Details Name: Roberto Lynch MRN: [id number] DOB: [birth year]   Cancelled Treatment:    Reason Eval/Treat Not Completed: Patient at procedure or test/unavailable. Pt off floor undergoing trach placement. Acute PT to return as able, as appropriate to progress mobility.  Lewis Shock, PT, DPT Acute Rehabilitation Services Pager #: [phone] Office #: [phone]    Iona Hansen 01/23/2020, 9:53 AM

## 2020-01-23 NOTE — Plan of Care (Deleted)

## 2020-01-23 NOTE — Progress Notes (Signed)
SLP Cancellation Note  Patient Details Name: LOMAN LOGAN MRN: [id number] DOB: Jul 06, 1955   Cancelled treatment:       Reason Eval/Treat Not Completed: Medical issues which prohibited therapy. Pt remains on vent with plans for trach today. Will f/u as able.   Mahala Menghini., M.A. CCC-SLP Acute Rehabilitation Services Pager [phone] Office [phone]  01/23/2020, 7:26 AM

## 2020-01-23 NOTE — TOC Progression Note (Addendum)
Transition of Care [facility]) - Progression Note    Patient Details  Name: Roberto Lynch MRN: [id number] Date of Birth: 1956/03/26  Transition of [facility]) CM/SW Contact  Glennon Mac, RN Phone Number: 01/23/2020, 4:44 PM  Clinical Narrative:  Pt s/p trach and PEG today.  Would be eligible for long term acute care hospital.  Per MD, referrals made to both area LTACs for eligibility review.     FMLA paperwork completed and faxed to [facility] Disability and [facility] Medical Certification, fax [phone].     Expected Discharge Plan: IP Rehab Facility Barriers to Discharge: Continued Medical Work up  Expected Discharge Plan and Services Expected Discharge Plan: IP Rehab Facility   Discharge Planning Services: CM Consult   Living arrangements for the past 2 months: Single Family Home                                       Social Determinants of Health (SDOH) Interventions    Readmission Risk Interventions No flowsheet data found.  Quintella Baton, RN, BSN  Trauma/Neuro ICU Case Manager [phone]

## 2020-01-23 NOTE — Plan of Care (Signed)
  Problem: Health Behavior/Discharge Planning: Goal: Ability to manage health-related needs will improve Outcome: Not Progressing   Problem: Clinical Measurements: Goal: Respiratory complications will improve Outcome: Progressing Goal: Cardiovascular complication will be avoided Outcome: Not Progressing   Problem: Activity: Goal: Risk for activity intolerance will decrease 01/23/2020 1343 by Jaclyn Shaggy, RN Outcome: Not Progressing 01/23/2020 1340 by Jaclyn Shaggy, RN Outcome: Progressing   Problem: Nutrition: Goal: Adequate nutrition will be maintained Outcome: Progressing   Problem: Coping: Goal: Level of anxiety will decrease Outcome: Not Progressing
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
## 2020-01-23 NOTE — Anesthesia Preprocedure Evaluation (Addendum)
Anesthesia Evaluation  Patient identified by MRN, date of birth, ID band Patient unresponsive    Reviewed: Allergy & Precautions, Patient's Chart, lab work & pertinent test results, Unable to perform ROS - Chart review only  Airway Mallampati: Intubated       Dental   Pulmonary Current Smoker,    Pulmonary exam normal        Cardiovascular hypertension, Pt. on medications and Pt. on home beta blockers  Rhythm:Regular Rate:Normal     Neuro/Psych negative neurological ROS  negative psych ROS   GI/Hepatic negative GI ROS, Neg liver ROS,   Endo/Other  negative endocrine ROS  Renal/GU negative Renal ROS     Musculoskeletal negative musculoskeletal ROS (+)   Abdominal Normal abdominal exam  (+)   Peds  Hematology negative hematology ROS (+)   Anesthesia Other Findings   Reproductive/Obstetrics                            Anesthesia Physical Anesthesia Plan  ASA: III  Anesthesia Plan: General   Post-op Pain Management:    Induction: Inhalational  PONV Risk Score and Plan: 2 and Ondansetron  Airway Management Planned: Oral ETT  Additional Equipment: None  Intra-op Plan:   Post-operative Plan: Post-operative intubation/ventilation  Informed Consent:     History available from chart only  Plan Discussed with: CRNA  Anesthesia Plan Comments:        Anesthesia Quick Evaluation

## 2020-01-23 NOTE — Anesthesia Postprocedure Evaluation (Signed)
Anesthesia Post Note  Patient: Roberto Lynch  Procedure(s) Performed: TRACHEOSTOMY (N/A Neck) PERCUTANEOUS ENDOSCOPIC GASTROSTOMY (PEG) PLACEMENT (N/A Abdomen)     Patient location during evaluation: SICU Anesthesia Type: General Level of consciousness: sedated Pain management: pain level controlled Vital Signs Assessment: post-procedure vital signs reviewed and stable Cardiovascular status: stable Postop Assessment: no apparent nausea or vomiting Anesthetic complications: no   No complications documented.  Last Vitals:  Vitals:   01/23/20 1115 01/23/20 1200  BP: (!) 196/108 (!) 149/97  Pulse: 81 80  Resp: 10 18  Temp:  36.5 C  SpO2: 95% 96%    Last Pain:  Vitals:   01/23/20 1200  TempSrc: Axillary  PainSc:                  Shelton Silvas

## 2020-01-24 ENCOUNTER — Encounter (HOSPITAL_COMMUNITY): Payer: Self-pay | Admitting: Surgery

## 2020-01-24 DIAGNOSIS — J9601 Acute respiratory failure with hypoxia: Secondary | ICD-10-CM

## 2020-01-24 LAB — GLUCOSE, CAPILLARY
Glucose-Capillary: 111 mg/dL — ABNORMAL HIGH (ref 70–99)
Glucose-Capillary: 111 mg/dL — ABNORMAL HIGH (ref 70–99)
Glucose-Capillary: 145 mg/dL — ABNORMAL HIGH (ref 70–99)
Glucose-Capillary: 123 mg/dL — ABNORMAL HIGH (ref 70–99)
Glucose-Capillary: 108 mg/dL — ABNORMAL HIGH (ref 70–99)
Glucose-Capillary: 109 mg/dL — ABNORMAL HIGH (ref 70–99)

## 2020-01-24 LAB — APTT
aPTT: 38 s — ABNORMAL HIGH (ref 24–36)
aPTT: 63 seconds — ABNORMAL HIGH (ref 24–36)

## 2020-01-24 LAB — HEPARIN LEVEL (UNFRACTIONATED)
Heparin Unfractionated: 0.29 [IU]/mL — ABNORMAL LOW (ref 0.30–0.70)
Heparin Unfractionated: 0.41 IU/mL (ref 0.30–0.70)

## 2020-01-24 MED ORDER — METOPROLOL TARTRATE 50 MG PO TABS
50.0000 mg | ORAL_TABLET | Freq: Three times a day (TID) | ORAL | Status: DC
  Administered 2020-01-24 – 2020-01-31 (×19): 50 mg
  Filled 2020-01-24 (×20): qty 1

## 2020-01-24 MED ORDER — ASPIRIN 81 MG PO CHEW
CHEWABLE_TABLET | ORAL | Status: AC
  Filled 2020-01-24: qty 1

## 2020-01-24 MED ORDER — FENTANYL CITRATE (PF) 100 MCG/2ML IJ SOLN: 50.0000 ug | INTRAMUSCULAR | Status: DC | PRN

## 2020-01-24 MED ORDER — ASPIRIN 81 MG PO CHEW
81.0000 mg | CHEWABLE_TABLET | Freq: Once | ORAL | Status: AC
  Administered 2020-01-24: 81 mg
  Filled 2020-01-24: qty 1

## 2020-01-24 MED ORDER — OXYCODONE HCL 5 MG/5ML PO SOLN
5.0000 mg | ORAL | Status: DC | PRN
  Administered 2020-01-24 – 2020-01-30 (×23): 10 mg
  Administered 2020-01-30: 5 mg
  Administered 2020-01-31: 10 mg
  Administered 2020-01-31 (×2): 5 mg
  Administered 2020-02-02 – 2020-02-03 (×2): 10 mg
  Filled 2020-01-24 (×8): qty 10
  Filled 2020-01-24: qty 5
  Filled 2020-01-24 (×11): qty 10
  Filled 2020-01-24: qty 5
  Filled 2020-01-24 (×5): qty 10
  Filled 2020-01-24: qty 5
  Filled 2020-01-24 (×2): qty 10

## 2020-01-24 MED ORDER — QUETIAPINE FUMARATE 200 MG PO TABS
400.0000 mg | ORAL_TABLET | Freq: Two times a day (BID) | ORAL | Status: DC
  Administered 2020-01-24 – 2020-01-26 (×6): 400 mg
  Filled 2020-01-24 (×7): qty 2

## 2020-01-24 MED ORDER — FREE WATER
200.0000 mL | Status: DC
  Administered 2020-01-24 – 2020-02-01 (×49): 200 mL

## 2020-01-24 MED ORDER — ENOXAPARIN SODIUM 40 MG/0.4ML ~~LOC~~ SOLN: 40.0000 mg | SUBCUTANEOUS | Status: DC
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
## 2020-01-24 NOTE — Progress Notes (Signed)
SLP Cancellation Note  Patient Details Name: Roberto Lynch MRN: [id number] DOB: 11-28-1955   Cancelled treatment:       Reason Eval/Treat Not Completed: Patient not medically ready. Trach yesterday, currently on the ventilator. His mentation so far has not been supportive of inline PMV placement so will follow to see if he is able to transition to TC.    Mahala Menghini., M.A. CCC-SLP Acute Rehabilitation Services Pager [phone] Office [phone]  01/24/2020, 10:33 AM

## 2020-01-24 NOTE — Progress Notes (Signed)
Patient placed back on full vent support per Dr. Bedelia Person.

## 2020-01-24 NOTE — Progress Notes (Signed)
Patient placed on CPAP 5 PS 10 at 40% by CCM.
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
## 2020-01-24 NOTE — Plan of Care (Signed)
  Problem: Clinical Measurements: Goal: Will remain free from infection Outcome: Progressing   Problem: Activity: Goal: Risk for activity intolerance will decrease Outcome: Not Progressing   Problem: Nutrition: Goal: Adequate nutrition will be maintained Outcome: Progressing   Problem: Elimination: Goal: Will not experience complications related to bowel motility Outcome: Not Progressing   Problem: Pain Managment: Goal: General experience of comfort will improve Outcome: Progressing

## 2020-01-24 NOTE — Progress Notes (Addendum)
ANTICOAGULATION CONSULT NOTE - Follow Up Consult  Pharmacy Consult for Heparin (While Apixaban on Hold) Indication: atrial fibrillation  Patient Measurements: Height: 5\' 9"  (175.3 cm) Weight: 116 kg (255 lb 11.7 oz) IBW/kg (Calculated) : 70.7 Heparin Dosing Weight: 96 kg  Vital Signs: Temp: 99.1 F (37.3 C) (10/05 1600) Temp Source: Oral (10/05 1600) BP: 113/75 (10/05 1600) Pulse Rate: 96 (10/05 1600)  Labs: Recent Labs    01/21/20 2021 01/22/20 0155 01/23/20 1106 01/23/20 1106 01/23/20 2245 01/24/20 0710 01/24/20 1730  HGB 12.1*  --  12.9*  --   --   --   --   HCT 40.0  --  42.4  --   --   --   --   PLT 134*  --  143*  --   --   --   --   APTT  --   --  32   < > 41* 38* 63*  LABPROT  --   --  13.5  --   --   --   --   INR  --   --  1.1  --   --   --   --   HEPARINUNFRC  --   --   --   --  0.25* 0.29* 0.41  CREATININE  --  1.08 1.10  --   --   --   --    < > = values in this interval not displayed.    Estimated Creatinine Clearance: 87.5 mL/min (by C-G formula based on SCr of 1.1 mg/dL).  Assessment: 64 yr old man on apixaban for new afib this admission - held after the 10/1 PM dose for trach placement 10/4. Post-trach, heparin was resumed to monitor for trach bleeding. If stable - likely can transition back to apixaban soon. Pharmacy was consulted to dose heparin while apixaban on hold.   Heparin level and aPTT ~7.5 hrs after heparin infusion was increased to 2000 units/hr were 0.41 units/ml and 63 sec, respectively (aPTT slightly below the goal range; heparin level still with some false elevation due to recent apixaban use). H/H, platelets stable. Per RN, no issues with IV or bleeding observed; no bleeding around trach site.  Goal of Therapy:  Heparin level 0.3-0.7 units/ml  aPTT: 66-102 sec Monitor platelets by anticoagulation protocol: Yes   Plan:  Increase heparin infusion to 2150 units/hr  Check aPTT, heparin level in 6 hrs Monitor daily heparin level,  aPTT, CBC Monitor for signs/symptoms of bleeding  Thank you for allowing pharmacy to be a part of this patient's care.  [id number], PharmD, BCPS, [facility] Clinical Pharmacist 01/24/2020 7:00 PM
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
## 2020-01-24 NOTE — Progress Notes (Signed)
Patient has been tachycardic and in Afib for the past few hours. Cheri Fowler MD notified and ordered rate control with Lopressor. Order put in with verbal read back. RN continuing to monitor

## 2020-01-24 NOTE — Progress Notes (Signed)
ANTICOAGULATION CONSULT NOTE - Follow Up Consult  Pharmacy Consult for Heparin (while Apixaban on hold) Indication: atrial fibrillation  Patient Measurements: Height: 5\' 9"  (175.3 cm) Weight: 116 kg (255 lb 11.7 oz) IBW/kg (Calculated) : 70.7 Heparin Dosing Weight: 96 kg  Vital Signs: Temp: 99 F (37.2 C) (10/05 0400) Temp Source: Axillary (10/05 0400) BP: 143/101 (10/05 0600) Pulse Rate: 60 (10/05 0600)  Labs: Recent Labs    01/21/20 1315 01/21/20 1315 01/21/20 2021 01/22/20 0155 01/23/20 1106 01/23/20 2245  HGB 12.3*   < > 12.1*  --  12.9*  --   HCT 40.9  --  40.0  --  42.4  --   PLT 148*  --  134*  --  143*  --   APTT  --   --   --   --  32 41*  LABPROT  --   --   --   --  13.5  --   INR  --   --   --   --  1.1  --   HEPARINUNFRC  --   --   --   --   --  0.25*  CREATININE  --   --   --  1.08 1.10  --    < > = values in this interval not displayed.    Estimated Creatinine Clearance: 87.5 mL/min (by C-G formula based on SCr of 1.1 mg/dL).  Assessment: 64 YOM on Apixaban for new Afib this admission - held after the 10/1 PM dose for trach placement 10/4. Post-trach, Heparin was resumed to monitor for trach bleeding. If stable - likely can transition back to Apixaban soon. Pharmacy consulted to dose.   Heparin level this morning is slightly SUBtherapeutic at 0.29 (still with some false elevation due to recent Apixaban use) and aPTT is also SUBtherapeutic at 38. No bleeding around the trach site noted per discussion with RN.   Goal of Therapy:  Heparin level 0.3-0.7 units/ml Monitor platelets by anticoagulation protocol: Yes   Plan:  - Increase Heparin to 2000 units/hr (20 ml/hr) - Will continue to monitor for any signs/symptoms of bleeding and will follow up with aPTT/HL in 6 hours  Thank you for allowing pharmacy to be a part of this patient's care.  12/4, PharmD, BCPS Clinical Pharmacist Clinical phone for 01/24/2020: [phone] 01/24/2020 7:27 AM    **Pharmacist phone directory can now be found on [email or web address] ([email or web address]).  Listed under [facility].
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
## 2020-01-24 NOTE — TOC Progression Note (Signed)
Transition of Care [facility]) - Progression Note    Patient Details  Name: Roberto Lynch MRN: [id number] Date of Birth: 12-09-1955  Transition of Care [place]) CM/SW Contact  Roberto Mac, RN Phone Number: 01/24/2020, 3:52 PM  Clinical Narrative:   Patient s/p trach and PEG on 01/23/2020; remains on ventilator at full support.  Patient appropriate for long-term acute care hospital and consult has been received.  Spoke with patient's wife, Roberto Lynch, regarding choices for LTAC hospital; she states she will discuss with family and call this case manager back.  Will follow with updates as available.  Received message from [place] with [employer]; she states she is a case Production designer, theatre/television/film following his progression for discharge planning.  Phone number [phone], extension [phone].  Called Roberto Lynch back, but received her voicemail.    Expected Discharge Plan: Long Term Acute Care (LTAC) Barriers to Discharge: Continued Medical Work up  Expected Discharge Plan and Services Expected Discharge Plan: Long Term Acute Care (LTAC)   Discharge Planning Services: CM Consult   Living arrangements for the past 2 months: Single Family Home                                       Social Determinants of Health (SDOH) Interventions    Readmission Risk Interventions No flowsheet data found.  Roberto Baton, RN, BSN  Trauma/Neuro ICU Case Manager [phone]

## 2020-01-25 ENCOUNTER — Other Ambulatory Visit: Payer: Self-pay

## 2020-01-25 LAB — GLUCOSE, CAPILLARY
Glucose-Capillary: 117 mg/dL — ABNORMAL HIGH (ref 70–99)
Glucose-Capillary: 114 mg/dL — ABNORMAL HIGH (ref 70–99)
Glucose-Capillary: 113 mg/dL — ABNORMAL HIGH (ref 70–99)
Glucose-Capillary: 105 mg/dL — ABNORMAL HIGH (ref 70–99)
Glucose-Capillary: 111 mg/dL — ABNORMAL HIGH (ref 70–99)
Glucose-Capillary: 106 mg/dL — ABNORMAL HIGH (ref 70–99)

## 2020-01-25 LAB — HEPARIN LEVEL (UNFRACTIONATED)
Heparin Unfractionated: 0.38 IU/mL (ref 0.30–0.70)
Heparin Unfractionated: 0.44 IU/mL (ref 0.30–0.70)

## 2020-01-25 LAB — APTT
aPTT: 58 seconds — ABNORMAL HIGH (ref 24–36)
aPTT: 65 seconds — ABNORMAL HIGH (ref 24–36)
aPTT: 90 seconds — ABNORMAL HIGH (ref 24–36)

## 2020-01-25 LAB — AMMONIA: Ammonia: 34 umol/L (ref 9–35)

## 2020-01-25 NOTE — TOC Progression Note (Signed)
Transition of Care [facility]) - Progression Note    Patient Details  Name: [name] MRN: [id number] Date of Birth: 24-Dec-1955  Transition of [facility] CM/SW Contact  Astrid Drafts Berna Spare, RN Phone Number: 01/25/2020, 1:21 PM  Clinical Narrative:   Notified by patient's wife, Gwendolyn, that [facility]  Spoke with Cassell Clement, admissions liaison for [facility]; she will began insurance authorization for admission to LTAC.  Will provide updates as available.    Expected Discharge Plan: Long Term Acute Care (LTAC) Barriers to Discharge: Continued Medical Work up  Expected Discharge Plan and Services Expected Discharge Plan: Long Term Acute Care (LTAC)   Discharge Planning Services: CM Consult   Living arrangements for the past 2 months: Single Family Home                                       Social Determinants of Health (SDOH) Interventions    Readmission Risk Interventions No flowsheet data found.  Quintella Baton, RN, BSN  Trauma/Neuro ICU Case Manager [phone]

## 2020-01-25 NOTE — Progress Notes (Signed)
ANTICOAGULATION CONSULT NOTE - Follow Up Consult  Pharmacy Consult for Heparin (while Apixaban on hold) Indication: atrial fibrillation  Patient Measurements: Height: 5\' 9"  (175.3 cm) Weight: 116 kg (255 lb 11.7 oz) IBW/kg (Calculated) : 70.7 Heparin Dosing Weight: 96 kg  Vital Signs: Temp: 99.4 F (37.4 C) (10/06 0800) Temp Source: Axillary (10/06 0800) BP: 146/79 (10/06 0900) Pulse Rate: 85 (10/06 0900)  Labs: Recent Labs    01/23/20 1106 01/23/20 2245 01/24/20 0710 01/24/20 1730 01/25/20 0154  HGB 12.9*  --   --   --   --   HCT 42.4  --   --   --   --   PLT 143*  --   --   --   --   APTT 32   < > 38* 63* 58*  LABPROT 13.5  --   --   --   --   INR 1.1  --   --   --   --   HEPARINUNFRC  --    < > 0.29* 0.41 0.38  CREATININE 1.10  --   --   --   --    < > = values in this interval not displayed.    Estimated Creatinine Clearance: 87.5 mL/min (by C-G formula based on SCr of 1.1 mg/dL).  Assessment: 64 YOM on Apixaban for new Afib this admission - held after the 10/1 PM dose for trach placement 10/4. Post-trach, Heparin was resumed to monitor for trach bleeding. If stable - likely can transition back to Apixaban soon. Pharmacy consulted to dose.   aPTT this morning is slightly SUBtherapeutic (aPTT 65, goal of 66-102). Heparin level is 0.44 and presumably still slightly falsely elevated in the setting of recent Apixaban use. No bleeding around the trach site noted per discussion with RN. Will wait and recheck HL in the AM for correlation.  Goal of Therapy:  Heparin level 0.3-0.7 units/ml Monitor platelets by anticoagulation protocol: Yes   Plan:  - Increase Heparin to 2450 units/hr (24.5 ml/hr) - Will continue to monitor for any signs/symptoms of bleeding and will follow up with aPTT in 6 hours   Thank you for allowing pharmacy to be a part of this patient's care.  12/4, PharmD, BCPS Clinical Pharmacist Clinical phone for 01/25/2020: 03/26/2020 01/25/2020  10:14 AM   **Pharmacist phone directory can now be found on [email or web address] ([email or web address]).  Listed under [facility].
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
## 2020-01-25 NOTE — Progress Notes (Signed)
ANTICOAGULATION CONSULT NOTE - Follow Up Consult  Pharmacy Consult for Heparin (while Apixaban on hold) Indication: atrial fibrillation  Patient Measurements: Height: 5\' 9"  (175.3 cm) Weight: 116 kg (255 lb 11.7 oz) IBW/kg (Calculated) : 70.7 Heparin Dosing Weight: 96 kg  Vital Signs: Temp: 100.7 F (38.2 C) (10/06 1600) Temp Source: Axillary (10/06 1600) BP: 103/63 (10/06 1800) Pulse Rate: 71 (10/06 1800)  Labs: Recent Labs    01/23/20 1106 01/23/20 2245 01/24/20 1730 01/24/20 1730 01/25/20 0154 01/25/20 1113 01/25/20 1915  HGB 12.9*  --   --   --   --   --   --   HCT 42.4  --   --   --   --   --   --   PLT 143*  --   --   --   --   --   --   APTT 32   < > 63*   < > 58* 65* 90*  LABPROT 13.5  --   --   --   --   --   --   INR 1.1  --   --   --   --   --   --   HEPARINUNFRC  --    < > 0.41  --  0.38 0.44  --   CREATININE 1.10  --   --   --   --   --   --    < > = values in this interval not displayed.    Estimated Creatinine Clearance: 87.5 mL/min (by C-G formula based on SCr of 1.1 mg/dL).  Assessment: 64 YOM on Apixaban for new Afib this admission - held after the 10/1 PM dose for trach placement 10/4. Post-trach, Heparin was resumed to monitor for trach bleeding. If stable - likely can transition back to Apixaban soon. Pharmacy consulted to dose.   aPTT tonight is therapeutic (90s, goal of 66-102). Heparin level was 0.44 this morning and presumably still slightly falsely elevated in the setting of recent Apixaban use. No bleeding around the trach site noted per discussion with RN.   Goal of Therapy:  Heparin level 0.3-0.7 units/ml Monitor platelets by anticoagulation protocol: Yes   Plan:  - Continue Heparin at 2450 units/hr (24.5 ml/hr) - Will continue to monitor for any signs/symptoms of bleeding and will follow up with aPTT and heparin level daily  Thank you for allowing pharmacy to be a part of this patient's care.  12/4 PharmD., BCPS Clinical  Pharmacist 01/25/2020 8:11 PM  **Pharmacist phone directory can now be found on [email or web address] ([email or web address]).  Listed under [facility].
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
## 2020-01-26 LAB — GLUCOSE, CAPILLARY
Glucose-Capillary: 119 mg/dL — ABNORMAL HIGH (ref 70–99)
Glucose-Capillary: 97 mg/dL (ref 70–99)
Glucose-Capillary: 101 mg/dL — ABNORMAL HIGH (ref 70–99)
Glucose-Capillary: 121 mg/dL — ABNORMAL HIGH (ref 70–99)
Glucose-Capillary: 110 mg/dL — ABNORMAL HIGH (ref 70–99)

## 2020-01-26 LAB — CBC
HCT: 35.4 % — ABNORMAL LOW (ref 39.0–52.0)
Hemoglobin: 11.2 g/dL — ABNORMAL LOW (ref 13.0–17.0)
MCH: 32.8 pg (ref 26.0–34.0)
MCHC: 31.6 g/dL (ref 30.0–36.0)
MCV: 103.8 fL — ABNORMAL HIGH (ref 80.0–100.0)
Platelets: 136 10*3/uL — ABNORMAL LOW (ref 150–400)
RBC: 3.41 MIL/uL — ABNORMAL LOW (ref 4.22–5.81)
RDW: 13.2 % (ref 11.5–15.5)
WBC: 6.7 10*3/uL (ref 4.0–10.5)
nRBC: 0 % (ref 0.0–0.2)

## 2020-01-26 LAB — APTT: aPTT: 73 seconds — ABNORMAL HIGH (ref 24–36)

## 2020-01-26 LAB — HEPARIN LEVEL (UNFRACTIONATED): Heparin Unfractionated: 0.4 IU/mL (ref 0.30–0.70)

## 2020-01-26 LAB — MRSA PCR SCREENING: MRSA by PCR: NEGATIVE

## 2020-01-26 MED ORDER — APIXABAN 5 MG PO TABS
5.0000 mg | ORAL_TABLET | Freq: Two times a day (BID) | ORAL | Status: DC
  Administered 2020-01-26 – 2020-02-05 (×21): 5 mg
  Filled 2020-01-26 (×23): qty 1

## 2020-01-26 MED ORDER — APIXABAN 5 MG PO TABS: 5.0000 mg | ORAL_TABLET | Freq: Two times a day (BID) | ORAL | Status: DC

## 2020-01-26 MED ORDER — BETHANECHOL CHLORIDE 25 MG PO TABS
25.0000 mg | ORAL_TABLET | Freq: Three times a day (TID) | ORAL | Status: DC
  Administered 2020-01-26 – 2020-02-05 (×31): 25 mg
  Filled 2020-01-26 (×5): qty 3
  Filled 2020-01-26 (×2): qty 1
  Filled 2020-01-26 (×2): qty 3
  Filled 2020-01-26 (×2): qty 1
  Filled 2020-01-26 (×2): qty 3
  Filled 2020-01-26: qty 1
  Filled 2020-01-26 (×4): qty 3
  Filled 2020-01-26: qty 1
  Filled 2020-01-26 (×2): qty 3
  Filled 2020-01-26: qty 1
  Filled 2020-01-26: qty 3
  Filled 2020-01-26 (×4): qty 1
  Filled 2020-01-26: qty 2.5
  Filled 2020-01-26 (×3): qty 3

## 2020-01-26 MED ORDER — FENTANYL CITRATE (PF) 100 MCG/2ML IJ SOLN
50.0000 ug | Freq: Four times a day (QID) | INTRAMUSCULAR | Status: DC | PRN
  Administered 2020-01-26 (×2): 50 ug via INTRAVENOUS
  Filled 2020-01-26 (×2): qty 2

## 2020-01-26 MED ORDER — OSMOLITE 1.5 CAL PO LIQD
1000.0000 mL | ORAL | Status: DC
  Administered 2020-01-27: 1000 mL
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
## 2020-01-26 NOTE — Progress Notes (Signed)
ANTICOAGULATION CONSULT NOTE - Follow Up Consult  Pharmacy Consult for Heparin (while Apixaban on hold) Indication: atrial fibrillation  Patient Measurements: Height: 5\' 9"  (175.3 cm) Weight: 116 kg (255 lb 11.7 oz) IBW/kg (Calculated) : 70.7 Heparin Dosing Weight: 96 kg  Vital Signs: Temp: 100.2 F (37.9 C) (10/07 0000) Temp Source: Axillary (10/07 0000) BP: 108/74 (10/07 0700) Pulse Rate: 64 (10/07 0700)  Labs: Recent Labs    01/23/20 1106 01/23/20 2245 01/25/20 0154 01/25/20 0154 01/25/20 1113 01/25/20 1915 01/26/20 0400  HGB 12.9*  --   --   --   --   --  11.2*  HCT 42.4  --   --   --   --   --  35.4*  PLT 143*  --   --   --   --   --  136*  APTT 32   < > 58*   < > 65* 90* 73*  LABPROT 13.5  --   --   --   --   --   --   INR 1.1  --   --   --   --   --   --   HEPARINUNFRC  --    < > 0.38  --  0.44  --  0.40  CREATININE 1.10  --   --   --   --   --   --    < > = values in this interval not displayed.    Estimated Creatinine Clearance: 87.5 mL/min (by C-G formula based on SCr of 1.1 mg/dL).  Assessment: 64 YOM on Apixaban for new Afib this admission - held after the 10/1 PM dose for trach placement 10/4. Post-trach, Heparin was resumed to monitor for trach bleeding. If stable - likely can transition back to Apixaban soon. Pharmacy consulted to dose.   APTT and heparin level this morning remain therapeutic (aPTT 73 and HL 0.4). No bleeding noted around the trach site. Discussed with CCM and will transition back over to Apixaban starting this evening.   Goal of Therapy:  Heparin level 0.3-0.7 units/ml Monitor platelets by anticoagulation protocol: Yes   Plan:  - Continue Heparin to 2450 units/hr (24.5 ml/hr) - d/c at 1759 - Start Apixaban 5 mg bid starting at 1800 this evening - Apixaban is new this admission - will still need to provide education as mentation status improves  Thank you for allowing pharmacy to be a part of this patient's care.  12/4, PharmD, BCPS Clinical Pharmacist Clinical phone for 01/26/2020: [phone] 01/26/2020 7:53 AM   **Pharmacist phone directory can now be found on [email or web address] ([email or web address]).  Listed under [facility].

## 2020-01-26 NOTE — TOC Progression Note (Signed)
Transition of Care [facility]) - Progression Note    Patient Details  Name: Roberto Lynch MRN: [id number] Date of Birth: [birth year]  Transition of Care [facility]) CM/SW Contact  Astrid Drafts Berna Spare, RN Phone Number: 01/26/2020, 5:22 PM  Clinical Narrative: Notified by [facility] that patient cannot move to LTAC while in four-point restraints, and exhibiting behaviors, such as kicking and spitting.  LTAC hospital will continue to follow patient for decrease in these behaviors, and lessening of restraints. MD is aware.   Expected Discharge Plan: Long Term Acute Care (LTAC) Barriers to Discharge: Continued Medical Work up  Expected Discharge Plan and Services Expected Discharge Plan: Long Term Acute Care (LTAC)   Discharge Planning Services: CM Consult   Living arrangements for the past 2 months: Single Family Home                                       Social Determinants of Health (SDOH) Interventions    Readmission Risk Interventions No flowsheet data found.  Quintella Baton, RN, BSN  Trauma/Neuro ICU Case Manager [phone]

## 2020-01-27 ENCOUNTER — Inpatient Hospital Stay (HOSPITAL_COMMUNITY): Payer: 59

## 2020-01-27 DIAGNOSIS — J9621 Acute and chronic respiratory failure with hypoxia: Secondary | ICD-10-CM | POA: Diagnosis not present

## 2020-01-27 DIAGNOSIS — J9622 Acute and chronic respiratory failure with hypercapnia: Secondary | ICD-10-CM | POA: Diagnosis not present

## 2020-01-27 LAB — BASIC METABOLIC PANEL
Anion gap: 7 (ref 5–15)
BUN: 12 mg/dL (ref 8–23)
CO2: 32 mmol/L (ref 22–32)
Calcium: 9.8 mg/dL (ref 8.9–10.3)
Chloride: 106 mmol/L (ref 98–111)
Creatinine, Ser: 0.89 mg/dL (ref 0.61–1.24)
GFR calc non Af Amer: >60 mL/min (ref >60)
Glucose, Bld: 102 mg/dL — ABNORMAL HIGH (ref 70–99)
Potassium: 3.9 mmol/L (ref 3.5–5.1)
Sodium: 145 mmol/L (ref 135–145)

## 2020-01-27 LAB — CBC
HCT: 36.2 % — ABNORMAL LOW (ref 39.0–52.0)
Hemoglobin: 11.1 g/dL — ABNORMAL LOW (ref 13.0–17.0)
MCH: 31.7 pg (ref 26.0–34.0)
MCHC: 30.7 g/dL (ref 30.0–36.0)
MCV: 103.4 fL — ABNORMAL HIGH (ref 80.0–100.0)
Platelets: 137 10*3/uL — ABNORMAL LOW (ref 150–400)
RBC: 3.5 MIL/uL — ABNORMAL LOW (ref 4.22–5.81)
RDW: 13 % (ref 11.5–15.5)
WBC: 7.2 10*3/uL (ref 4.0–10.5)
nRBC: 0.3 % — ABNORMAL HIGH (ref 0.0–0.2)

## 2020-01-27 LAB — GLUCOSE, CAPILLARY
Glucose-Capillary: 102 mg/dL — ABNORMAL HIGH (ref 70–99)
Glucose-Capillary: 114 mg/dL — ABNORMAL HIGH (ref 70–99)
Glucose-Capillary: 134 mg/dL — ABNORMAL HIGH (ref 70–99)
Glucose-Capillary: 112 mg/dL — ABNORMAL HIGH (ref 70–99)
Glucose-Capillary: 132 mg/dL — ABNORMAL HIGH (ref 70–99)
Glucose-Capillary: 121 mg/dL — ABNORMAL HIGH (ref 70–99)

## 2020-01-27 LAB — MAGNESIUM: Magnesium: 2 mg/dL (ref 1.7–2.4)

## 2020-01-27 LAB — PHOSPHORUS: Phosphorus: 3 mg/dL (ref 2.5–4.6)

## 2020-01-27 MED ORDER — QUETIAPINE FUMARATE 100 MG PO TABS
200.0000 mg | ORAL_TABLET | Freq: Two times a day (BID) | ORAL | Status: DC
  Administered 2020-01-27 – 2020-02-04 (×17): 200 mg
  Filled 2020-01-27 (×2): qty 2
  Filled 2020-01-27: qty 1
  Filled 2020-01-27: qty 2
  Filled 2020-01-27: qty 1
  Filled 2020-01-27 (×2): qty 2
  Filled 2020-01-27 (×4): qty 1
  Filled 2020-01-27: qty 2
  Filled 2020-01-27 (×4): qty 1

## 2020-01-27 MED ORDER — CLONAZEPAM 0.5 MG PO TBDP
1.0000 mg | ORAL_TABLET | Freq: Two times a day (BID) | ORAL | Status: DC
  Administered 2020-01-27 – 2020-02-04 (×17): 1 mg
  Filled 2020-01-27 (×16): qty 2

## 2020-01-27 MED ORDER — ERGOCALCIFEROL 200 MCG/ML PO SOLN
8000.0000 [IU] | Freq: Every day | ORAL | Status: DC
  Filled 2020-01-27: qty 1

## 2020-01-27 MED ORDER — ERGOCALCIFEROL 200 MCG/ML PO SOLN
8000.0000 [IU] | Freq: Every day | ORAL | Status: DC
  Administered 2020-01-28 – 2020-02-05 (×9): 8000 [IU]
  Filled 2020-01-27 (×10): qty 1

## 2020-01-27 MED ORDER — HALOPERIDOL LACTATE 5 MG/ML IJ SOLN
2.0000 mg | Freq: Four times a day (QID) | INTRAMUSCULAR | Status: DC | PRN
  Administered 2020-01-27: 2 mg via INTRAVENOUS
  Filled 2020-01-27: qty 1

## 2020-01-27 MED ORDER — CLONAZEPAM 0.5 MG PO TBDP
1.0000 mg | ORAL_TABLET | Freq: Two times a day (BID) | ORAL | Status: DC
  Filled 2020-01-27: qty 2

## 2020-01-27 MED ORDER — MELATONIN 3 MG PO TABS
3.0000 mg | ORAL_TABLET | Freq: Every day | ORAL | Status: DC
  Administered 2020-01-27 – 2020-02-05 (×10): 3 mg via ORAL
  Filled 2020-01-27 (×12): qty 1

## 2020-01-27 MED ORDER — HALOPERIDOL LACTATE 5 MG/ML IJ SOLN
5.0000 mg | Freq: Four times a day (QID) | INTRAMUSCULAR | Status: DC | PRN
  Administered 2020-01-27 – 2020-02-06 (×15): 5 mg via INTRAVENOUS
  Filled 2020-01-27 (×17): qty 1

## 2020-01-27 MED ORDER — PROSOURCE TF PO LIQD
45.0000 mL | Freq: Three times a day (TID) | ORAL | Status: DC
  Administered 2020-01-27 – 2020-02-03 (×22): 45 mL
  Filled 2020-01-27 (×22): qty 45

## 2020-01-27 MED ORDER — OSMOLITE 1.5 CAL PO LIQD
1000.0000 mL | ORAL | Status: DC
  Administered 2020-01-27 – 2020-02-03 (×8): 1000 mL
  Filled 2020-01-27 (×3): qty 1000

## 2020-01-27 NOTE — Progress Notes (Signed)
SLP Cancellation Note  Patient Details Name: Roberto Lynch MRN: [id number] DOB: Oct 25, 1955   Cancelled treatment:       Reason Eval/Treat Not Completed: Fatigue/lethargy limiting ability to participate. Pt sleeping soundly at the moment - per chart, he received PRN Haldol this afternoon. Will f/u for PMV trials as able.    Mahala Menghini., M.A. CCC-SLP Acute Rehabilitation Services Pager [phone] Office [phone]  01/27/2020, 2:05 PM
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
## 2020-01-27 NOTE — Progress Notes (Signed)
Orthopedic Tech Progress Note Patient Details:  Roberto Lynch 1956-03-11 [id number] Applied MOLESKIN around the top of patient SHORT LEG CAST  Patient ID: Roberto Lynch, male   DOB: 10-01-1955, 64 y.o.   MRN: [id number]   [name] 01/27/2020, 11:50 AM
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
## 2020-01-27 NOTE — Plan of Care (Signed)
  Problem: Clinical Measurements: Goal: Will remain free from infection Outcome: Progressing   Problem: Nutrition: Goal: Adequate nutrition will be maintained Outcome: Progressing   Problem: Pain Managment: Goal: General experience of comfort will improve Outcome: Progressing   Problem: Safety: Goal: Ability to remain free from injury will improve Outcome: Progressing   Problem: Skin Integrity: Goal: Risk for impaired skin integrity will decrease Outcome: Progressing   

## 2020-01-28 DIAGNOSIS — J9622 Acute and chronic respiratory failure with hypercapnia: Secondary | ICD-10-CM | POA: Diagnosis not present

## 2020-01-28 DIAGNOSIS — J9621 Acute and chronic respiratory failure with hypoxia: Secondary | ICD-10-CM | POA: Diagnosis not present

## 2020-01-28 LAB — URINALYSIS, ROUTINE W REFLEX MICROSCOPIC
Bilirubin Urine: NEGATIVE
Glucose, UA: NEGATIVE mg/dL
Ketones, ur: NEGATIVE mg/dL
Nitrite: POSITIVE — AB
Protein, ur: 30 mg/dL — AB
RBC / HPF: >50 RBC/hpf — ABNORMAL HIGH (ref 0–5)
Specific Gravity, Urine: 1.016 (ref 1.005–1.030)
WBC, UA: >50 WBC/hpf — ABNORMAL HIGH (ref 0–5)
pH: 6 (ref 5.0–8.0)

## 2020-01-28 LAB — CBC
HCT: 37.1 % — ABNORMAL LOW (ref 39.0–52.0)
Hemoglobin: 11.6 g/dL — ABNORMAL LOW (ref 13.0–17.0)
MCH: 31.9 pg (ref 26.0–34.0)
MCHC: 31.3 g/dL (ref 30.0–36.0)
MCV: 101.9 fL — ABNORMAL HIGH (ref 80.0–100.0)
Platelets: 141 10*3/uL — ABNORMAL LOW (ref 150–400)
RBC: 3.64 MIL/uL — ABNORMAL LOW (ref 4.22–5.81)
RDW: 12.8 % (ref 11.5–15.5)
WBC: 7.4 10*3/uL (ref 4.0–10.5)
nRBC: 0 % (ref 0.0–0.2)

## 2020-01-28 LAB — GLUCOSE, CAPILLARY
Glucose-Capillary: 139 mg/dL — ABNORMAL HIGH (ref 70–99)
Glucose-Capillary: 137 mg/dL — ABNORMAL HIGH (ref 70–99)
Glucose-Capillary: 145 mg/dL — ABNORMAL HIGH (ref 70–99)
Glucose-Capillary: 143 mg/dL — ABNORMAL HIGH (ref 70–99)
Glucose-Capillary: 132 mg/dL — ABNORMAL HIGH (ref 70–99)
Glucose-Capillary: 112 mg/dL — ABNORMAL HIGH (ref 70–99)

## 2020-01-28 MED ORDER — CIPROFLOXACIN IN D5W 400 MG/200ML IV SOLN
400.0000 mg | Freq: Once | INTRAVENOUS | Status: AC
  Administered 2020-01-28: 400 mg via INTRAVENOUS
  Filled 2020-01-28: qty 200

## 2020-01-28 MED ORDER — ALBUTEROL SULFATE (2.5 MG/3ML) 0.083% IN NEBU: 2.5000 mg | INHALATION_SOLUTION | RESPIRATORY_TRACT | Status: DC | PRN

## 2020-01-28 MED ORDER — CIPROFLOXACIN IN D5W 200 MG/100ML IV SOLN: 200.0000 mg | Freq: Two times a day (BID) | INTRAVENOUS | Status: DC

## 2020-01-28 MED ORDER — PIPERACILLIN-TAZOBACTAM 3.375 G IVPB
3.3750 g | Freq: Three times a day (TID) | INTRAVENOUS | Status: AC
  Administered 2020-01-28 – 2020-02-01 (×13): 3.375 g via INTRAVENOUS
  Filled 2020-01-28 (×13): qty 50
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
## 2020-01-28 NOTE — Progress Notes (Signed)
Contacted pharmacist to verify cipro ordered by Dr. Wallace Cullens. She stated she is attempted to have him change order. Will wait for further instruction.

## 2020-01-29 ENCOUNTER — Inpatient Hospital Stay (HOSPITAL_COMMUNITY): Payer: 59

## 2020-01-29 DIAGNOSIS — J9622 Acute and chronic respiratory failure with hypercapnia: Secondary | ICD-10-CM | POA: Diagnosis not present

## 2020-01-29 DIAGNOSIS — J9621 Acute and chronic respiratory failure with hypoxia: Secondary | ICD-10-CM | POA: Diagnosis not present

## 2020-01-29 LAB — POCT I-STAT 7, (LYTES, BLD GAS, ICA,H+H)
Acid-Base Excess: 9 mmol/L — ABNORMAL HIGH (ref 0.0–2.0)
Bicarbonate: 35.8 mmol/L — ABNORMAL HIGH (ref 20.0–28.0)
Calcium, Ion: 1.33 mmol/L (ref 1.15–1.40)
HCT: 32 % — ABNORMAL LOW (ref 39.0–52.0)
Hemoglobin: 10.9 g/dL — ABNORMAL LOW (ref 13.0–17.0)
O2 Saturation: 93 %
Patient temperature: 98.6
Potassium: 4 mmol/L (ref 3.5–5.1)
Sodium: 142 mmol/L (ref 135–145)
TCO2: 38 mmol/L — ABNORMAL HIGH (ref 22–32)
pCO2 arterial: 58.4 mmHg — ABNORMAL HIGH (ref 32.0–48.0)
pH, Arterial: 7.395 (ref 7.350–7.450)
pO2, Arterial: 70 mmHg — ABNORMAL LOW (ref 83.0–108.0)

## 2020-01-29 LAB — COMPREHENSIVE METABOLIC PANEL
ALT: 23 U/L (ref 0–44)
AST: 16 U/L (ref 15–41)
Albumin: 2.4 g/dL — ABNORMAL LOW (ref 3.5–5.0)
Alkaline Phosphatase: 69 U/L (ref 38–126)
Anion gap: 8 (ref 5–15)
BUN: 14 mg/dL (ref 8–23)
CO2: 32 mmol/L (ref 22–32)
Calcium: 9.3 mg/dL (ref 8.9–10.3)
Chloride: 101 mmol/L (ref 98–111)
Creatinine, Ser: 0.89 mg/dL (ref 0.61–1.24)
GFR, Estimated: >60 mL/min (ref >60)
Glucose, Bld: 122 mg/dL — ABNORMAL HIGH (ref 70–99)
Potassium: 3.9 mmol/L (ref 3.5–5.1)
Sodium: 141 mmol/L (ref 135–145)
Total Bilirubin: 0.4 mg/dL (ref 0.3–1.2)
Total Protein: 6.1 g/dL — ABNORMAL LOW (ref 6.5–8.1)

## 2020-01-29 LAB — CBC
HCT: 36.1 % — ABNORMAL LOW (ref 39.0–52.0)
Hemoglobin: 11.4 g/dL — ABNORMAL LOW (ref 13.0–17.0)
MCH: 32.6 pg (ref 26.0–34.0)
MCHC: 31.6 g/dL (ref 30.0–36.0)
MCV: 103.1 fL — ABNORMAL HIGH (ref 80.0–100.0)
Platelets: 146 10*3/uL — ABNORMAL LOW (ref 150–400)
RBC: 3.5 MIL/uL — ABNORMAL LOW (ref 4.22–5.81)
RDW: 13 % (ref 11.5–15.5)
WBC: 8 10*3/uL (ref 4.0–10.5)
nRBC: 0.3 % — ABNORMAL HIGH (ref 0.0–0.2)

## 2020-01-29 LAB — GLUCOSE, CAPILLARY
Glucose-Capillary: 118 mg/dL — ABNORMAL HIGH (ref 70–99)
Glucose-Capillary: 144 mg/dL — ABNORMAL HIGH (ref 70–99)
Glucose-Capillary: 137 mg/dL — ABNORMAL HIGH (ref 70–99)

## 2020-01-29 LAB — CULTURE, RESPIRATORY W GRAM STAIN: Culture: NORMAL

## 2020-01-29 LAB — FOLATE: Folate: 16 ng/mL (ref >5.9)

## 2020-01-29 LAB — VITAMIN B12: Vitamin B-12: 528 pg/mL (ref 180–914)

## 2020-01-29 LAB — T4, FREE: Free T4: 0.65 ng/dL (ref 0.61–1.12)

## 2020-01-29 LAB — TSH: TSH: 2.567 u[IU]/mL (ref 0.350–4.500)

## 2020-01-29 MED ORDER — SODIUM CHLORIDE 3 % IN NEBU
4.0000 mL | INHALATION_SOLUTION | Freq: Two times a day (BID) | RESPIRATORY_TRACT | Status: DC
  Administered 2020-01-29: 4 mL via RESPIRATORY_TRACT
  Filled 2020-01-29: qty 4

## 2020-01-29 MED ORDER — SODIUM CHLORIDE 3 % IN NEBU
4.0000 mL | INHALATION_SOLUTION | Freq: Two times a day (BID) | RESPIRATORY_TRACT | Status: DC
  Administered 2020-01-30 – 2020-02-08 (×15): 4 mL via RESPIRATORY_TRACT
  Filled 2020-01-29 (×16): qty 4

## 2020-01-29 MED ORDER — GUAIFENESIN 100 MG/5ML PO SOLN
5.0000 mL | Freq: Four times a day (QID) | ORAL | Status: DC
  Administered 2020-01-29 – 2020-02-08 (×38): 100 mg via ORAL
  Filled 2020-01-29: qty 15
  Filled 2020-01-29 (×4): qty 10
  Filled 2020-01-29: qty 15
  Filled 2020-01-29 (×2): qty 10
  Filled 2020-01-29: qty 15
  Filled 2020-01-29: qty 10
  Filled 2020-01-29 (×4): qty 15
  Filled 2020-01-29: qty 10
  Filled 2020-01-29 (×4): qty 15
  Filled 2020-01-29 (×2): qty 10
  Filled 2020-01-29: qty 15
  Filled 2020-01-29 (×5): qty 10
  Filled 2020-01-29: qty 15
  Filled 2020-01-29 (×4): qty 10
  Filled 2020-01-29: qty 15
  Filled 2020-01-29: qty 10
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
## 2020-01-30 ENCOUNTER — Inpatient Hospital Stay (HOSPITAL_COMMUNITY): Payer: 59

## 2020-01-30 DIAGNOSIS — R0689 Other abnormalities of breathing: Secondary | ICD-10-CM | POA: Diagnosis not present

## 2020-01-30 DIAGNOSIS — J9601 Acute respiratory failure with hypoxia: Secondary | ICD-10-CM

## 2020-01-30 DIAGNOSIS — R0600 Dyspnea, unspecified: Secondary | ICD-10-CM | POA: Diagnosis not present

## 2020-01-30 DIAGNOSIS — Z978 Presence of other specified devices: Secondary | ICD-10-CM | POA: Diagnosis not present

## 2020-01-30 DIAGNOSIS — N179 Acute kidney failure, unspecified: Secondary | ICD-10-CM | POA: Diagnosis not present

## 2020-01-30 LAB — GLUCOSE, CAPILLARY
Glucose-Capillary: 110 mg/dL — ABNORMAL HIGH (ref 70–99)
Glucose-Capillary: 119 mg/dL — ABNORMAL HIGH (ref 70–99)
Glucose-Capillary: 110 mg/dL — ABNORMAL HIGH (ref 70–99)
Glucose-Capillary: 135 mg/dL — ABNORMAL HIGH (ref 70–99)
Glucose-Capillary: 113 mg/dL — ABNORMAL HIGH (ref 70–99)
Glucose-Capillary: 99 mg/dL (ref 70–99)

## 2020-01-30 LAB — CBC
HCT: 35.9 % — ABNORMAL LOW (ref 39.0–52.0)
Hemoglobin: 11.2 g/dL — ABNORMAL LOW (ref 13.0–17.0)
MCH: 31.5 pg (ref 26.0–34.0)
MCHC: 31.2 g/dL (ref 30.0–36.0)
MCV: 101.1 fL — ABNORMAL HIGH (ref 80.0–100.0)
Platelets: 145 10*3/uL — ABNORMAL LOW (ref 150–400)
RBC: 3.55 MIL/uL — ABNORMAL LOW (ref 4.22–5.81)
RDW: 12.9 % (ref 11.5–15.5)
WBC: 8.5 10*3/uL (ref 4.0–10.5)
nRBC: 0 % (ref 0.0–0.2)

## 2020-01-30 MED ORDER — ALBUTEROL SULFATE (2.5 MG/3ML) 0.083% IN NEBU: 2.5000 mg | INHALATION_SOLUTION | RESPIRATORY_TRACT | Status: DC | PRN

## 2020-01-30 NOTE — TOC Progression Note (Addendum)
Transition of Care [facility]) - Progression Note    Patient Details  Name: Roberto Lynch MRN: [id number] Date of Birth: 1956/04/20  [facility] CM/SW Contact  Glennon Mac, RN Phone Number: 01/30/2020, 12:54 PM  Clinical Narrative:  Pt now out of restraints; behaviors have improved.  Notified Carinna Batten in admissions with [facility]; she states that they should be able to admit this week, but are currently tight on beds.  Admissions dept will follow and notify this Case Manager when bed available.    Addendum: 108 Updated Kristy with [facility] as to discharge plan/plan for [facility] hospital.   Phone: [phone], ext [phone]; she is available for any other dc needs.   Expected Discharge Plan: Long Term Acute Care (LTAC) Barriers to Discharge: Continued Medical Work up  Expected Discharge Plan and Services Expected Discharge Plan: Long Term Acute Care (LTAC)   Discharge Planning Services: CM Consult   Living arrangements for the past 2 months: Single Family Home                                       Social Determinants of Health (SDOH) Interventions    Readmission Risk Interventions No flowsheet data found.  Quintella Baton, RN, BSN  Trauma/Neuro ICU Case Manager [phone]

## 2020-01-30 NOTE — Progress Notes (Signed)
Sutures removed per verbal order. Patient tolerated well. Trach cleaned. Slight redness around trach site. No breakdown seen.
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
## 2020-01-31 DIAGNOSIS — Z930 Tracheostomy status: Secondary | ICD-10-CM

## 2020-01-31 DIAGNOSIS — J9601 Acute respiratory failure with hypoxia: Secondary | ICD-10-CM | POA: Diagnosis not present

## 2020-01-31 LAB — MAGNESIUM: Magnesium: 2.1 mg/dL (ref 1.7–2.4)

## 2020-01-31 LAB — BASIC METABOLIC PANEL
Anion gap: 11 (ref 5–15)
BUN: 14 mg/dL (ref 8–23)
CO2: 31 mmol/L (ref 22–32)
Calcium: 9.6 mg/dL (ref 8.9–10.3)
Chloride: 101 mmol/L (ref 98–111)
Creatinine, Ser: 0.88 mg/dL (ref 0.61–1.24)
GFR, Estimated: >60 mL/min (ref >60)
Glucose, Bld: 116 mg/dL — ABNORMAL HIGH (ref 70–99)
Potassium: 3.1 mmol/L — ABNORMAL LOW (ref 3.5–5.1)
Sodium: 143 mmol/L (ref 135–145)

## 2020-01-31 LAB — GLUCOSE, CAPILLARY
Glucose-Capillary: 129 mg/dL — ABNORMAL HIGH (ref 70–99)
Glucose-Capillary: 109 mg/dL — ABNORMAL HIGH (ref 70–99)
Glucose-Capillary: 136 mg/dL — ABNORMAL HIGH (ref 70–99)
Glucose-Capillary: 133 mg/dL — ABNORMAL HIGH (ref 70–99)
Glucose-Capillary: 112 mg/dL — ABNORMAL HIGH (ref 70–99)
Glucose-Capillary: 126 mg/dL — ABNORMAL HIGH (ref 70–99)

## 2020-01-31 LAB — PHOSPHORUS: Phosphorus: 3.3 mg/dL (ref 2.5–4.6)

## 2020-01-31 MED ORDER — POTASSIUM CHLORIDE 20 MEQ/15ML (10%) PO SOLN
20.0000 meq | Freq: Once | ORAL | Status: AC
  Administered 2020-01-31: 20 meq via ORAL
  Filled 2020-01-31: qty 15

## 2020-01-31 MED ORDER — POTASSIUM CHLORIDE 10 MEQ/100ML IV SOLN
10.0000 meq | INTRAVENOUS | Status: AC
  Administered 2020-01-31 (×2): 10 meq via INTRAVENOUS
  Filled 2020-01-31 (×2): qty 100
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
## 2020-01-31 NOTE — Progress Notes (Signed)
Attending note attestation for Roberto Lynch progress note 10/12: I have seen and examined the patient. History, labs and imaging reviewed.  Interval History:  10/12: impulsive this am and restless. Breathing is ok when he is able to get secretions out otherwise will have to sit up need suctioning and then appears to brady down. Still with fair amount of secretions that often unable to completely clear 10/11: pt cont with tct. Fair amount of secretions precluding transfer from ICU, but soon.     Labs reviewed, significant for  K 3.1  Imaging: no acute infiltrates noted, personally reviewed by me.  Assessment/plan: Resp failure hypoxic:  -s/p trach -cont wean -once secretions/hr improved stable for transfer from icu.   Bradycardia:  -appear to correlate with coughing fits or need for suction.  -will follow. If persists as secretions improve may warrant cardiology note and eval.   Hypokalemia:  -replace care time 25 mins. This represents my time independent of the NP's/PA's/med student/residents time taking care of the pt. This is excluding procedures   Briant Sites DO [facility] 01/31/2020, 7:50 AM
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
## 2020-02-01 DIAGNOSIS — R0689 Other abnormalities of breathing: Secondary | ICD-10-CM | POA: Diagnosis not present

## 2020-02-01 DIAGNOSIS — R0600 Dyspnea, unspecified: Secondary | ICD-10-CM | POA: Diagnosis not present

## 2020-02-01 LAB — GLUCOSE, CAPILLARY
Glucose-Capillary: 116 mg/dL — ABNORMAL HIGH (ref 70–99)
Glucose-Capillary: 119 mg/dL — ABNORMAL HIGH (ref 70–99)
Glucose-Capillary: 118 mg/dL — ABNORMAL HIGH (ref 70–99)
Glucose-Capillary: 122 mg/dL — ABNORMAL HIGH (ref 70–99)
Glucose-Capillary: 121 mg/dL — ABNORMAL HIGH (ref 70–99)
Glucose-Capillary: 110 mg/dL — ABNORMAL HIGH (ref 70–99)

## 2020-02-01 LAB — BASIC METABOLIC PANEL
Anion gap: 12 (ref 5–15)
BUN: 11 mg/dL (ref 8–23)
CO2: 29 mmol/L (ref 22–32)
Calcium: 9.8 mg/dL (ref 8.9–10.3)
Chloride: 101 mmol/L (ref 98–111)
Creatinine, Ser: 0.81 mg/dL (ref 0.61–1.24)
GFR, Estimated: >60 mL/min (ref >60)
Glucose, Bld: 122 mg/dL — ABNORMAL HIGH (ref 70–99)
Potassium: 3.3 mmol/L — ABNORMAL LOW (ref 3.5–5.1)
Sodium: 142 mmol/L (ref 135–145)

## 2020-02-01 MED ORDER — POTASSIUM CHLORIDE 20 MEQ/15ML (10%) PO SOLN
40.0000 meq | ORAL | Status: AC
  Administered 2020-02-01: 40 meq
  Filled 2020-02-01: qty 30

## 2020-02-01 MED ORDER — HYDRALAZINE HCL 20 MG/ML IJ SOLN: 5.0000 mg | Freq: Four times a day (QID) | INTRAMUSCULAR | Status: DC | PRN

## 2020-02-01 MED ORDER — AMLODIPINE BESYLATE 10 MG PO TABS
10.0000 mg | ORAL_TABLET | Freq: Every day | ORAL | Status: DC
  Administered 2020-02-01 – 2020-02-05 (×5): 10 mg
  Filled 2020-02-01 (×6): qty 1

## 2020-02-01 MED ORDER — FREE WATER
100.0000 mL | Status: DC
  Administered 2020-02-01 – 2020-02-05 (×27): 100 mL
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
## 2020-02-01 NOTE — TOC Progression Note (Signed)
Transition of Care [facility]) - Progression Note    Patient Details  Name: Roberto Lynch MRN: [id number] Date of Birth: 12-Aug-1955  Transition of Care [facility]) CM/SW Contact  Astrid Drafts Berna Spare, RN Phone Number: 02/01/2020, 5:15 PM  Clinical Narrative:  Spoke with [facility] admissions staff; still no bed available, and not anticipating bed availability this week.  Will provide updates as available.      Expected Discharge Plan: Long Term Acute Care (LTAC) Barriers to Discharge: Continued Medical Work up  Expected Discharge Plan and Services Expected Discharge Plan: Long Term Acute Care (LTAC)   Discharge Planning Services: CM Consult   Living arrangements for the past 2 months: Single Family Home                                       Social Determinants of Health (SDOH) Interventions    Readmission Risk Interventions No flowsheet data found.  Quintella Baton, RN, BSN  Trauma/Neuro ICU Case Manager [phone]

## 2020-02-01 NOTE — Progress Notes (Signed)
Order received for trach downsize to 6.0 uncuffed shiley trach.  An 8.0 cuffed shiley trach removed without difficulty, and a 6.0 uncuffed shiley replaced without difficulty.  Positive end tidal color change noted after change.  Patient tolerated well; no complications noted.

## 2020-02-01 NOTE — Progress Notes (Signed)
[name] notified about pt's transfer to 4N14

## 2020-02-02 DIAGNOSIS — S92111D Displaced fracture of neck of right talus, subsequent encounter for fracture with routine healing: Secondary | ICD-10-CM

## 2020-02-02 DIAGNOSIS — R1312 Dysphagia, oropharyngeal phase: Secondary | ICD-10-CM

## 2020-02-02 DIAGNOSIS — R410 Disorientation, unspecified: Secondary | ICD-10-CM

## 2020-02-02 LAB — GLUCOSE, CAPILLARY
Glucose-Capillary: 101 mg/dL — ABNORMAL HIGH (ref 70–99)
Glucose-Capillary: 105 mg/dL — ABNORMAL HIGH (ref 70–99)
Glucose-Capillary: 110 mg/dL — ABNORMAL HIGH (ref 70–99)
Glucose-Capillary: 110 mg/dL — ABNORMAL HIGH (ref 70–99)
Glucose-Capillary: 115 mg/dL — ABNORMAL HIGH (ref 70–99)
Glucose-Capillary: 122 mg/dL — ABNORMAL HIGH (ref 70–99)
Glucose-Capillary: 145 mg/dL — ABNORMAL HIGH (ref 70–99)

## 2020-02-02 MED ORDER — HALOPERIDOL LACTATE 5 MG/ML IJ SOLN
2.0000 mg | Freq: Once | INTRAMUSCULAR | Status: AC
  Administered 2020-02-02: 2 mg via INTRAVENOUS
  Filled 2020-02-02: qty 1
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
## 2020-02-02 NOTE — Progress Notes (Signed)
Patient's wife and brother updated on patient's status.  Password stated.  Patient's wife thanks this nurse for the update
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
## 2020-02-02 NOTE — Progress Notes (Signed)
eLink Physician-Brief Progress Note Patient Name: Roberto Lynch DOB: [birth year] MRN: [id number]   Date of Service  02/02/2020  HPI/Events of Note  Patient with agitated delirium, attempting to climb out of bed thereby posing a fall risk. QTc 0.44.  eICU Interventions  Haldol 2 mg iv x 1, bilateral soft wrist restraints ordered for safety.        Thomasene Lot Braylin Xu 02/02/2020, 6:08 AM
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
## 2020-02-03 ENCOUNTER — Inpatient Hospital Stay (HOSPITAL_COMMUNITY): Payer: 59

## 2020-02-03 DIAGNOSIS — J9601 Acute respiratory failure with hypoxia: Secondary | ICD-10-CM | POA: Diagnosis not present

## 2020-02-03 DIAGNOSIS — Z930 Tracheostomy status: Secondary | ICD-10-CM

## 2020-02-03 DIAGNOSIS — E876 Hypokalemia: Secondary | ICD-10-CM

## 2020-02-03 LAB — CBC
HCT: 41.7 % (ref 39.0–52.0)
Hemoglobin: 13.2 g/dL (ref 13.0–17.0)
MCH: 31.7 pg (ref 26.0–34.0)
MCHC: 31.7 g/dL (ref 30.0–36.0)
MCV: 100 fL (ref 80.0–100.0)
Platelets: 200 10*3/uL (ref 150–400)
RBC: 4.17 MIL/uL — ABNORMAL LOW (ref 4.22–5.81)
RDW: 13.5 % (ref 11.5–15.5)
WBC: 8.2 10*3/uL (ref 4.0–10.5)
nRBC: 0.2 % (ref 0.0–0.2)

## 2020-02-03 LAB — GLUCOSE, CAPILLARY
Glucose-Capillary: 116 mg/dL — ABNORMAL HIGH (ref 70–99)
Glucose-Capillary: 108 mg/dL — ABNORMAL HIGH (ref 70–99)
Glucose-Capillary: 102 mg/dL — ABNORMAL HIGH (ref 70–99)
Glucose-Capillary: 110 mg/dL — ABNORMAL HIGH (ref 70–99)
Glucose-Capillary: 122 mg/dL — ABNORMAL HIGH (ref 70–99)
Glucose-Capillary: 121 mg/dL — ABNORMAL HIGH (ref 70–99)

## 2020-02-03 LAB — MAGNESIUM: Magnesium: 2 mg/dL (ref 1.7–2.4)

## 2020-02-03 LAB — RENAL FUNCTION PANEL
Albumin: 2.8 g/dL — ABNORMAL LOW (ref 3.5–5.0)
Anion gap: 11 (ref 5–15)
BUN: 15 mg/dL (ref 8–23)
CO2: 26 mmol/L (ref 22–32)
Calcium: 10 mg/dL (ref 8.9–10.3)
Chloride: 104 mmol/L (ref 98–111)
Creatinine, Ser: 0.86 mg/dL (ref 0.61–1.24)
GFR, Estimated: >60 mL/min (ref >60)
Glucose, Bld: 130 mg/dL — ABNORMAL HIGH (ref 70–99)
Phosphorus: 3.5 mg/dL (ref 2.5–4.6)
Potassium: 3.3 mmol/L — ABNORMAL LOW (ref 3.5–5.1)
Sodium: 141 mmol/L (ref 135–145)

## 2020-02-03 MED ORDER — BUDESONIDE 0.25 MG/2ML IN SUSP
0.2500 mg | Freq: Two times a day (BID) | RESPIRATORY_TRACT | Status: DC
  Administered 2020-02-03 – 2020-02-08 (×10): 0.25 mg via RESPIRATORY_TRACT
  Filled 2020-02-03 (×10): qty 2

## 2020-02-03 MED ORDER — PROSOURCE TF PO LIQD
45.0000 mL | Freq: Two times a day (BID) | ORAL | Status: DC
  Administered 2020-02-04 – 2020-02-05 (×4): 45 mL
  Filled 2020-02-03 (×5): qty 45

## 2020-02-03 MED ORDER — ORAL CARE MOUTH RINSE
15.0000 mL | Freq: Two times a day (BID) | OROMUCOSAL | Status: DC
  Administered 2020-02-04 – 2020-02-08 (×10): 15 mL via OROMUCOSAL

## 2020-02-03 MED ORDER — OSMOLITE 1.5 CAL PO LIQD
1000.0000 mL | ORAL | Status: DC
  Administered 2020-02-03 – 2020-02-05 (×3): 1000 mL
  Filled 2020-02-03: qty 1000

## 2020-02-03 MED ORDER — POTASSIUM CHLORIDE CRYS ER 20 MEQ PO TBCR: 40.0000 meq | EXTENDED_RELEASE_TABLET | ORAL | Status: DC

## 2020-02-03 MED ORDER — GERHARDT'S BUTT CREAM
TOPICAL_CREAM | CUTANEOUS | Status: DC | PRN
  Filled 2020-02-03: qty 1

## 2020-02-03 MED ORDER — POTASSIUM CHLORIDE 20 MEQ/15ML (10%) PO SOLN
40.0000 meq | ORAL | Status: AC
  Administered 2020-02-03 (×2): 40 meq
  Filled 2020-02-03 (×2): qty 30

## 2020-02-03 NOTE — Progress Notes (Signed)
Patient is doing well today. Restraints have been off since I arrived at 0700. Patients agitation has improved greatly. Patient is joking and in good sprits.
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
## 2020-02-03 NOTE — Progress Notes (Signed)
Per dietitian order nocturnal feed at 90 ml/hr spoke with Dr. Alanda Slim for clarification patient preciously at 60 ml/hr and concern for intolerance at that rate and concern for decrease oral intake for breakfast due to being too full. Patient ate lunch well. Per Dr. Alanda Slim verbal order given to start nocturnal feed at 60 ml/hr 6pm-6am. Patient did not receive dinner tray until 1825 and is still eating. Will pass on to oncoming nurse to start nocturnal feed once dinner is complete.
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
## 2020-02-03 NOTE — Progress Notes (Signed)
Patients wife Argil Mahl brought in [employer] comp forms to be singed I placed them on the chart and notified Dr. Alanda Slim via message.
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
## 2020-02-03 NOTE — Progress Notes (Signed)
Patient off the floor for swallow study.

## 2020-02-03 NOTE — Progress Notes (Signed)
Modified Barium Swallow Progress Note  Patient Details  Name: Roberto Lynch MRN: [id number] Date of Birth: 02/02/1956  Today's Date: 02/03/2020  Modified Barium Swallow completed.  Full report located under Chart Review in the Imaging Section.  Brief recommendations include the following:  Clinical Impression   Pt started the study very alert with swift swallowing given thin liquid trials. He initiated swallows at the valleculae, and despite reduced anterior hyoid movement and base of tongue retraction, was able to clear his pharynx well. He had occasional trace, but transient penetration (PAS#2). With purees he starts to take a little longer to complete posterior transit through his oral cavity, and by the time he got to solids he seemed to be starting to fatigue. He primarily masticates with his anterior dentition, also pushing the solids around with his tongue. Mild vallecular residue is present with purees, increasing to more moderate amounts that fill more of the valleculae with soft solids, especially when he has more trouble thoroughly chewing the bolus. Residue clears well with a liquid wash, but when pt was more fatigued, he also had a single episode of trace but silent aspiration. This was a result of not adequately coordinating airway closure during large, consecutive boluses. Recommend starting Dys 2 diet and thin liquids but would encourage single sips as much as possible. I think he would also benefit a lot from assistance with repositioning for meals and monitoring for signs of fatigue, taking rest breaks PRN.    Swallow Evaluation Recommendations       SLP Diet Recommendations: Dysphagia 2 (Fine chop) solids;Thin liquid   Liquid Administration via: Cup;Straw   Medication Administration: Crushed with puree   Supervision: Staff to assist with self feeding;Full supervision/cueing for compensatory strategies   Compensations: Slow rate   Postural Changes: Seated upright at 90  degrees   Oral Care Recommendations: Oral care BID   Other Recommendations: Place PMSV during PO intake    Mahala Menghini., M.A. CCC-SLP Acute Rehabilitation Services Pager [phone] Office [phone]  02/03/2020,10:52 AM

## 2020-02-03 NOTE — Plan of Care (Signed)
  Problem: Health Behavior/Discharge Planning: Goal: Ability to manage health-related needs will improve Outcome: Progressing   Problem: Clinical Measurements: Goal: Diagnostic test results will improve Outcome: Progressing   Problem: Clinical Measurements: Goal: Respiratory complications will improve Outcome: Progressing   
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
## 2020-02-04 LAB — GLUCOSE, CAPILLARY
Glucose-Capillary: 103 mg/dL — ABNORMAL HIGH (ref 70–99)
Glucose-Capillary: 120 mg/dL — ABNORMAL HIGH (ref 70–99)
Glucose-Capillary: 114 mg/dL — ABNORMAL HIGH (ref 70–99)
Glucose-Capillary: 105 mg/dL — ABNORMAL HIGH (ref 70–99)
Glucose-Capillary: 100 mg/dL — ABNORMAL HIGH (ref 70–99)

## 2020-02-04 LAB — POTASSIUM: Potassium: 4.2 mmol/L (ref 3.5–5.1)

## 2020-02-04 LAB — MAGNESIUM: Magnesium: 2.2 mg/dL (ref 1.7–2.4)

## 2020-02-04 MED ORDER — OXYCODONE HCL 5 MG/5ML PO SOLN
5.0000 mg | ORAL | Status: DC | PRN
  Administered 2020-02-04 – 2020-02-05 (×2): 5 mg
  Filled 2020-02-04 (×2): qty 5

## 2020-02-04 MED ORDER — CLONAZEPAM 0.5 MG PO TBDP
0.5000 mg | ORAL_TABLET | Freq: Two times a day (BID) | ORAL | Status: DC
  Administered 2020-02-04 – 2020-02-05 (×3): 0.5 mg
  Filled 2020-02-04 (×3): qty 1

## 2020-02-04 MED ORDER — QUETIAPINE FUMARATE 100 MG PO TABS
100.0000 mg | ORAL_TABLET | Freq: Two times a day (BID) | ORAL | Status: DC
  Administered 2020-02-04 – 2020-02-05 (×2): 100 mg
  Filled 2020-02-04 (×2): qty 1
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
## 2020-02-04 NOTE — Progress Notes (Signed)
Pt removed trach. Rt put trach back in place with RN at bedside.  ETCO2 detector turned yellow . Pt sats remained at 94. Clear bilateral breath sounds present

## 2020-02-04 NOTE — Progress Notes (Signed)
Inpatient Rehab Admissions:  Inpatient Rehab Consult received.  I met with patient and son at the bedside for rehabilitation assessment and to discuss goals and expectations of an inpatient rehab admission.  Both acknowledged understanding of CIR goals and expectations.  Both interested in CIR for pt.  Pt appears to be an appropriate CIR candidate. Awaiting further OT treatment and discharge recommendations.  Signed: Gayland Curry, [place], [place] Admissions Coordinator [phone]

## 2020-02-05 LAB — GLUCOSE, CAPILLARY
Glucose-Capillary: 97 mg/dL (ref 70–99)
Glucose-Capillary: 112 mg/dL — ABNORMAL HIGH (ref 70–99)
Glucose-Capillary: 100 mg/dL — ABNORMAL HIGH (ref 70–99)
Glucose-Capillary: 114 mg/dL — ABNORMAL HIGH (ref 70–99)
Glucose-Capillary: 121 mg/dL — ABNORMAL HIGH (ref 70–99)
Glucose-Capillary: 117 mg/dL — ABNORMAL HIGH (ref 70–99)

## 2020-02-05 MED ORDER — QUETIAPINE FUMARATE 100 MG PO TABS
200.0000 mg | ORAL_TABLET | Freq: Two times a day (BID) | ORAL | Status: DC
  Administered 2020-02-05: 200 mg
  Filled 2020-02-05 (×2): qty 2

## 2020-02-05 MED ORDER — BETHANECHOL CHLORIDE 10 MG PO TABS
10.0000 mg | ORAL_TABLET | Freq: Three times a day (TID) | ORAL | Status: DC
  Administered 2020-02-05 (×2): 10 mg
  Filled 2020-02-05 (×3): qty 1

## 2020-02-05 MED ORDER — OXYCODONE HCL 5 MG/5ML PO SOLN
5.0000 mg | Freq: Four times a day (QID) | ORAL | Status: DC | PRN
  Filled 2020-02-05: qty 5
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
## 2020-02-05 NOTE — Progress Notes (Signed)
Inpatient Rehab Admissions Coordinator:  Spoke with pt's wife, Elinor Dodge on the phone. Explained CIR goals and recommendations to her.  She acknowledged understanding.  She is interested in pursuing CIR for pt.  She confirmed appropriate disposition for pt to be considered a candidate for CIR.  Awaiting further OT treatment and discharge recommendation.  Will continue to follow.   Wolfgang Phoenix, MS, CCC-SLP Admissions Coordinator [phone]
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
## 2020-02-06 LAB — CK: Total CK: 20 U/L — ABNORMAL LOW (ref 49–397)

## 2020-02-06 LAB — POTASSIUM: Potassium: 4 mmol/L (ref 3.5–5.1)

## 2020-02-06 LAB — GLUCOSE, CAPILLARY
Glucose-Capillary: 106 mg/dL — ABNORMAL HIGH (ref 70–99)
Glucose-Capillary: 119 mg/dL — ABNORMAL HIGH (ref 70–99)
Glucose-Capillary: 124 mg/dL — ABNORMAL HIGH (ref 70–99)
Glucose-Capillary: 112 mg/dL — ABNORMAL HIGH (ref 70–99)

## 2020-02-06 LAB — HEMOGLOBIN AND HEMATOCRIT, BLOOD
HCT: 43.9 % (ref 39.0–52.0)
Hemoglobin: 14.1 g/dL (ref 13.0–17.0)

## 2020-02-06 LAB — MAGNESIUM: Magnesium: 2.1 mg/dL (ref 1.7–2.4)

## 2020-02-06 MED ORDER — PANTOPRAZOLE SODIUM 40 MG PO TBEC
40.0000 mg | DELAYED_RELEASE_TABLET | Freq: Two times a day (BID) | ORAL | Status: DC
  Administered 2020-02-06 – 2020-02-08 (×4): 40 mg via ORAL
  Filled 2020-02-06 (×4): qty 1

## 2020-02-06 MED ORDER — AMLODIPINE BESYLATE 10 MG PO TABS
10.0000 mg | ORAL_TABLET | Freq: Every day | ORAL | Status: DC
  Administered 2020-02-07 – 2020-02-08 (×2): 10 mg via ORAL
  Filled 2020-02-06 (×3): qty 1

## 2020-02-06 MED ORDER — PROSOURCE PLUS PO LIQD
30.0000 mL | Freq: Two times a day (BID) | ORAL | Status: DC
  Administered 2020-02-07 – 2020-02-08 (×3): 30 mL via ORAL
  Filled 2020-02-06 (×2): qty 30

## 2020-02-06 MED ORDER — THIAMINE HCL 100 MG PO TABS
100.0000 mg | ORAL_TABLET | Freq: Every day | ORAL | Status: DC
  Administered 2020-02-07 – 2020-02-08 (×2): 100 mg via ORAL
  Filled 2020-02-06 (×3): qty 1

## 2020-02-06 MED ORDER — MELATONIN 5 MG PO TABS
5.0000 mg | ORAL_TABLET | Freq: Every day | ORAL | Status: DC
  Administered 2020-02-06 – 2020-02-07 (×2): 5 mg via ORAL
  Filled 2020-02-06 (×2): qty 1

## 2020-02-06 MED ORDER — AMIODARONE HCL 200 MG PO TABS
200.0000 mg | ORAL_TABLET | Freq: Every day | ORAL | Status: DC
  Administered 2020-02-07 – 2020-02-08 (×2): 200 mg via ORAL
  Filled 2020-02-06 (×3): qty 1

## 2020-02-06 MED ORDER — POLYETHYLENE GLYCOL 3350 17 G PO PACK
17.0000 g | PACK | Freq: Every day | ORAL | Status: DC
  Filled 2020-02-06: qty 1

## 2020-02-06 MED ORDER — CLONAZEPAM 0.5 MG PO TBDP
1.0000 mg | ORAL_TABLET | Freq: Two times a day (BID) | ORAL | Status: DC
  Filled 2020-02-06: qty 2

## 2020-02-06 MED ORDER — VITAMIN D 25 MCG (1000 UNIT) PO TABS
2000.0000 [IU] | ORAL_TABLET | Freq: Every day | ORAL | Status: DC
  Administered 2020-02-06 – 2020-02-08 (×3): 2000 [IU] via ORAL
  Filled 2020-02-06 (×3): qty 2

## 2020-02-06 MED ORDER — APIXABAN 5 MG PO TABS
5.0000 mg | ORAL_TABLET | Freq: Two times a day (BID) | ORAL | Status: DC
  Administered 2020-02-06 – 2020-02-08 (×4): 5 mg via ORAL
  Filled 2020-02-06 (×4): qty 1

## 2020-02-06 MED ORDER — QUETIAPINE FUMARATE 100 MG PO TABS
200.0000 mg | ORAL_TABLET | Freq: Two times a day (BID) | ORAL | Status: DC
  Administered 2020-02-06 – 2020-02-08 (×4): 200 mg via ORAL
  Filled 2020-02-06 (×4): qty 2

## 2020-02-06 MED ORDER — CLONAZEPAM 0.5 MG PO TBDP
1.0000 mg | ORAL_TABLET | Freq: Two times a day (BID) | ORAL | Status: DC
  Administered 2020-02-06 – 2020-02-08 (×4): 1 mg via ORAL
  Filled 2020-02-06 (×4): qty 2

## 2020-02-06 MED ORDER — ENSURE ENLIVE PO LIQD
237.0000 mL | Freq: Two times a day (BID) | ORAL | Status: DC
  Administered 2020-02-06 – 2020-02-08 (×5): 237 mL via ORAL

## 2020-02-06 MED ORDER — BETHANECHOL CHLORIDE 10 MG PO TABS
10.0000 mg | ORAL_TABLET | Freq: Three times a day (TID) | ORAL | Status: DC
  Administered 2020-02-06 – 2020-02-08 (×7): 10 mg via ORAL
  Filled 2020-02-06 (×7): qty 1

## 2020-02-06 MED ORDER — LORAZEPAM 2 MG/ML IJ SOLN
0.5000 mg | Freq: Three times a day (TID) | INTRAMUSCULAR | Status: DC | PRN
  Administered 2020-02-07: 0.5 mg via INTRAVENOUS
  Filled 2020-02-06: qty 1

## 2020-02-06 MED ORDER — LORAZEPAM 2 MG/ML IJ SOLN
0.5000 mg | Freq: Once | INTRAMUSCULAR | Status: AC
  Administered 2020-02-06: 0.5 mg via INTRAVENOUS
  Filled 2020-02-06: qty 1

## 2020-02-06 MED ORDER — FOLIC ACID 1 MG PO TABS
1.0000 mg | ORAL_TABLET | Freq: Every day | ORAL | Status: DC
  Administered 2020-02-07 – 2020-02-08 (×2): 1 mg via ORAL
  Filled 2020-02-06 (×2): qty 1

## 2020-02-06 NOTE — Progress Notes (Signed)
Removed patients PMV speaking valve for night time rest.
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
## 2020-02-06 NOTE — Progress Notes (Signed)
Attempted replacement of g-tube at the bedside.  Tube progresses past skin level but unable to introduce through gastrotomy.  Order in place for IR to replace as able.  Letha Cape 11:01 AM 02/06/2020

## 2020-02-06 NOTE — Progress Notes (Signed)
Shelbie Hutching speaking valve was not available. A charge nurse and I were searching passeyy muir valve in his room, but we couldn't find. It.
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
## 2020-02-06 NOTE — Progress Notes (Signed)
Apparently patient got confused and agitated overnight and pulled out his PEG tube.  Will see if IR able to replace tube.  Letha Cape 7:55 AM 02/06/2020

## 2020-02-06 NOTE — Progress Notes (Addendum)
0030: RN was in bedside and charting. Patient quickly grabbed PEG tube and pulled it out. Tube feeding stopped. Patient is restless. He is high risk to pull trach. Mitten were applied and Dr. Bruna Potter notified.  Will continue to assess.   0100: Patient is agitated with mitten. Haldol IV PRN  administered for agitation and awaiting to see the effect. Will continue to assess.   0130: patient tore mitten. Wrist restraints applied per Dr. Bruna Potter order. He is calm for a second, but he is banging the rails.    0430: Rsetraints were off for cleaning him up. He was trying to punch RNs and tried to get out of bed. Restraints were immediately applied. Also, Engineer, materials came and talked to him. Dr. Bruna Potter notified and ativan 0.25 ml given.    At 0535: he is sleeping but easy to wake up.  Patient is brady down while deep sleeping. Vital signs are stable. Dr. Bruna Potter is aware. Will continue to assess.

## 2020-02-06 NOTE — Progress Notes (Signed)
During this shift, patient had SWR until about 1pm when patient was more oriented and aware of his surroundings and situation. Patient has been able to feed himself, reoriented to place, recognizes wife and had meaningful conversations via telephone and in hospital visit. IR was unable to reinsert PEG so foley was placed as a place holder until patient is able to have PEG replaced on 02/07/20. Tolerated well. Patient continues to have episodes of confusion to time, place, and situation but has had no episodes of violence, trying to get out of bed, or pull at things. Patient safety maintained with freq checks, bed alarm audible and activated, call bell within reach,and all calls answered promptly.

## 2020-02-07 ENCOUNTER — Telehealth: Payer: Self-pay | Admitting: Acute Care

## 2020-02-07 LAB — RENAL FUNCTION PANEL
Albumin: 3.2 g/dL — ABNORMAL LOW (ref 3.5–5.0)
Anion gap: 11 (ref 5–15)
BUN: 13 mg/dL (ref 8–23)
CO2: 25 mmol/L (ref 22–32)
Calcium: 10.2 mg/dL (ref 8.9–10.3)
Chloride: 102 mmol/L (ref 98–111)
Creatinine, Ser: 0.99 mg/dL (ref 0.61–1.24)
GFR, Estimated: >60 mL/min (ref >60)
Glucose, Bld: 107 mg/dL — ABNORMAL HIGH (ref 70–99)
Phosphorus: 4 mg/dL (ref 2.5–4.6)
Potassium: 4 mmol/L (ref 3.5–5.1)
Sodium: 138 mmol/L (ref 135–145)

## 2020-02-07 LAB — CBC
HCT: 44 % (ref 39.0–52.0)
Hemoglobin: 14.2 g/dL (ref 13.0–17.0)
MCH: 32.2 pg (ref 26.0–34.0)
MCHC: 32.3 g/dL (ref 30.0–36.0)
MCV: 99.8 fL (ref 80.0–100.0)
Platelets: 266 10*3/uL (ref 150–400)
RBC: 4.41 MIL/uL (ref 4.22–5.81)
RDW: 13.4 % (ref 11.5–15.5)
WBC: 8.9 10*3/uL (ref 4.0–10.5)
nRBC: 0 % (ref 0.0–0.2)

## 2020-02-07 LAB — GLUCOSE, CAPILLARY
Glucose-Capillary: 107 mg/dL — ABNORMAL HIGH (ref 70–99)
Glucose-Capillary: 109 mg/dL — ABNORMAL HIGH (ref 70–99)
Glucose-Capillary: 113 mg/dL — ABNORMAL HIGH (ref 70–99)
Glucose-Capillary: 113 mg/dL — ABNORMAL HIGH (ref 70–99)
Glucose-Capillary: 117 mg/dL — ABNORMAL HIGH (ref 70–99)
Glucose-Capillary: 144 mg/dL — ABNORMAL HIGH (ref 70–99)

## 2020-02-07 LAB — MAGNESIUM: Magnesium: 2.1 mg/dL (ref 1.7–2.4)

## 2020-02-07 NOTE — Progress Notes (Signed)
IP rehab admissions - I met with patient at the bedside today.  He says he is ready to do therapies and work hard to be able to get back home.  I have called and will open the case to request acute inpatient rehab admission.  Call me for questions.  [phone]
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
## 2020-02-07 NOTE — Telephone Encounter (Signed)
r Dr. Kendrick Fries, this patient needs follow up for CPAP therapy once he has been decannulated. He will go to CIR or SNF before going home, but will need follow up at our office as soon as decannulated. Thanks
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
## 2020-02-08 ENCOUNTER — Inpatient Hospital Stay (HOSPITAL_COMMUNITY)
Admission: RE | Admit: 2020-02-08 | Discharge: 2020-02-15 | DRG: 559 | Disposition: A | Discharge status: Home Health | Payer: 59 | Source: Intra-hospital | Attending: Physical Medicine & Rehabilitation | Admitting: Physical Medicine & Rehabilitation

## 2020-02-08 ENCOUNTER — Encounter (HOSPITAL_COMMUNITY): Payer: Self-pay | Admitting: Physical Medicine & Rehabilitation

## 2020-02-08 ENCOUNTER — Other Ambulatory Visit: Payer: Self-pay

## 2020-02-08 ENCOUNTER — Inpatient Hospital Stay (HOSPITAL_COMMUNITY): Payer: 59

## 2020-02-08 DIAGNOSIS — Z91030 Bee allergy status: Secondary | ICD-10-CM | POA: Diagnosis not present

## 2020-02-08 DIAGNOSIS — M898X9 Other specified disorders of bone, unspecified site: Secondary | ICD-10-CM

## 2020-02-08 DIAGNOSIS — S92111D Displaced fracture of neck of right talus, subsequent encounter for fracture with routine healing: Principal | ICD-10-CM

## 2020-02-08 DIAGNOSIS — Z7982 Long term (current) use of aspirin: Secondary | ICD-10-CM

## 2020-02-08 DIAGNOSIS — G650 Sequelae of Guillain-Barre syndrome: Secondary | ICD-10-CM | POA: Diagnosis present

## 2020-02-08 DIAGNOSIS — J449 Chronic obstructive pulmonary disease, unspecified: Secondary | ICD-10-CM | POA: Diagnosis present

## 2020-02-08 DIAGNOSIS — I4891 Unspecified atrial fibrillation: Secondary | ICD-10-CM | POA: Diagnosis present

## 2020-02-08 DIAGNOSIS — T07XXXA Unspecified multiple injuries, initial encounter: Secondary | ICD-10-CM

## 2020-02-08 DIAGNOSIS — Z931 Gastrostomy status: Secondary | ICD-10-CM | POA: Diagnosis not present

## 2020-02-08 DIAGNOSIS — E662 Morbid (severe) obesity with alveolar hypoventilation: Secondary | ICD-10-CM | POA: Diagnosis present

## 2020-02-08 DIAGNOSIS — I10 Essential (primary) hypertension: Secondary | ICD-10-CM | POA: Diagnosis present

## 2020-02-08 DIAGNOSIS — Z79899 Other long term (current) drug therapy: Secondary | ICD-10-CM | POA: Diagnosis not present

## 2020-02-08 DIAGNOSIS — Z791 Long term (current) use of non-steroidal anti-inflammatories (NSAID): Secondary | ICD-10-CM | POA: Diagnosis not present

## 2020-02-08 DIAGNOSIS — R1310 Dysphagia, unspecified: Secondary | ICD-10-CM | POA: Diagnosis present

## 2020-02-08 DIAGNOSIS — E7220 Disorder of urea cycle metabolism, unspecified: Secondary | ICD-10-CM | POA: Diagnosis present

## 2020-02-08 DIAGNOSIS — G5790 Unspecified mononeuropathy of unspecified lower limb: Secondary | ICD-10-CM | POA: Diagnosis present

## 2020-02-08 DIAGNOSIS — J9809 Other diseases of bronchus, not elsewhere classified: Secondary | ICD-10-CM | POA: Diagnosis present

## 2020-02-08 DIAGNOSIS — Z86718 Personal history of other venous thrombosis and embolism: Secondary | ICD-10-CM

## 2020-02-08 DIAGNOSIS — J9612 Chronic respiratory failure with hypercapnia: Secondary | ICD-10-CM

## 2020-02-08 DIAGNOSIS — K7290 Hepatic failure, unspecified without coma: Secondary | ICD-10-CM

## 2020-02-08 DIAGNOSIS — F17210 Nicotine dependence, cigarettes, uncomplicated: Secondary | ICD-10-CM | POA: Diagnosis present

## 2020-02-08 DIAGNOSIS — I480 Paroxysmal atrial fibrillation: Secondary | ICD-10-CM

## 2020-02-08 DIAGNOSIS — J9622 Acute and chronic respiratory failure with hypercapnia: Secondary | ICD-10-CM | POA: Diagnosis present

## 2020-02-08 DIAGNOSIS — S0181XD Laceration without foreign body of other part of head, subsequent encounter: Secondary | ICD-10-CM

## 2020-02-08 DIAGNOSIS — M9080 Osteopathy in diseases classified elsewhere, unspecified site: Secondary | ICD-10-CM

## 2020-02-08 DIAGNOSIS — Z930 Tracheostomy status: Secondary | ICD-10-CM

## 2020-02-08 DIAGNOSIS — S2221XD Fracture of manubrium, subsequent encounter for fracture with routine healing: Secondary | ICD-10-CM

## 2020-02-08 DIAGNOSIS — K7682 Hepatic encephalopathy: Secondary | ICD-10-CM

## 2020-02-08 DIAGNOSIS — G629 Polyneuropathy, unspecified: Secondary | ICD-10-CM | POA: Diagnosis present

## 2020-02-08 DIAGNOSIS — E559 Vitamin D deficiency, unspecified: Secondary | ICD-10-CM

## 2020-02-08 DIAGNOSIS — Z7951 Long term (current) use of inhaled steroids: Secondary | ICD-10-CM

## 2020-02-08 DIAGNOSIS — F1020 Alcohol dependence, uncomplicated: Secondary | ICD-10-CM | POA: Diagnosis present

## 2020-02-08 DIAGNOSIS — F05 Delirium due to known physiological condition: Secondary | ICD-10-CM | POA: Diagnosis not present

## 2020-02-08 DIAGNOSIS — E889 Metabolic disorder, unspecified: Secondary | ICD-10-CM

## 2020-02-08 DIAGNOSIS — Z09 Encounter for follow-up examination after completed treatment for conditions other than malignant neoplasm: Secondary | ICD-10-CM

## 2020-02-08 DIAGNOSIS — Z720 Tobacco use: Secondary | ICD-10-CM

## 2020-02-08 DIAGNOSIS — S2239XD Fracture of one rib, unspecified side, subsequent encounter for fracture with routine healing: Secondary | ICD-10-CM

## 2020-02-08 DIAGNOSIS — T1490XA Injury, unspecified, initial encounter: Secondary | ICD-10-CM | POA: Diagnosis present

## 2020-02-08 LAB — GLUCOSE, CAPILLARY
Glucose-Capillary: 130 mg/dL — ABNORMAL HIGH (ref 70–99)
Glucose-Capillary: 105 mg/dL — ABNORMAL HIGH (ref 70–99)
Glucose-Capillary: 107 mg/dL — ABNORMAL HIGH (ref 70–99)
Glucose-Capillary: 127 mg/dL — ABNORMAL HIGH (ref 70–99)
Glucose-Capillary: 135 mg/dL — ABNORMAL HIGH (ref 70–99)

## 2020-02-08 MED ORDER — PANTOPRAZOLE SODIUM 40 MG PO TBEC
40.0000 mg | DELAYED_RELEASE_TABLET | Freq: Two times a day (BID) | ORAL | Status: DC
Start: 2020-02-08 — End: 2020-02-15

## 2020-02-08 MED ORDER — DOCUSATE SODIUM 100 MG PO CAPS
100.0000 mg | ORAL_CAPSULE | Freq: Every day | ORAL | Status: DC
  Administered 2020-02-09 – 2020-02-15 (×6): 100 mg via ORAL
  Filled 2020-02-08 (×7): qty 1

## 2020-02-08 MED ORDER — APIXABAN 5 MG PO TABS
5.0000 mg | ORAL_TABLET | Freq: Two times a day (BID) | ORAL | Status: DC
  Administered 2020-02-08 – 2020-02-14 (×12): 5 mg via ORAL
  Filled 2020-02-08 (×12): qty 1

## 2020-02-08 MED ORDER — AMIODARONE HCL 200 MG PO TABS
200.0000 mg | ORAL_TABLET | Freq: Every day | ORAL | Status: DC
Start: 2020-02-09 — End: 2020-02-15

## 2020-02-08 MED ORDER — GUAIFENESIN 100 MG/5ML PO SOLN
5.0000 mL | Freq: Four times a day (QID) | ORAL | Status: DC
  Administered 2020-02-09 – 2020-02-15 (×20): 100 mg via ORAL
  Filled 2020-02-08 (×12): qty 5
  Filled 2020-02-08: qty 25
  Filled 2020-02-08 (×3): qty 5
  Filled 2020-02-08: qty 25
  Filled 2020-02-08 (×2): qty 5
  Filled 2020-02-08: qty 25
  Filled 2020-02-08 (×2): qty 5

## 2020-02-08 MED ORDER — LORAZEPAM 0.5 MG PO TABS
0.5000 mg | ORAL_TABLET | Freq: Once | ORAL | Status: DC
  Filled 2020-02-08: qty 1

## 2020-02-08 MED ORDER — THIAMINE HCL 100 MG PO TABS
100.0000 mg | ORAL_TABLET | Freq: Every day | ORAL | Status: DC
Start: 2020-02-09 — End: 2020-02-15

## 2020-02-08 MED ORDER — SODIUM CHLORIDE 3 % IN NEBU
4.0000 mL | INHALATION_SOLUTION | Freq: Two times a day (BID) | RESPIRATORY_TRACT | Status: DC
  Administered 2020-02-08 – 2020-02-15 (×13): 4 mL via RESPIRATORY_TRACT
  Filled 2020-02-08 (×15): qty 4

## 2020-02-08 MED ORDER — VITAMIN D 25 MCG (1000 UNIT) PO TABS
2000.0000 [IU] | ORAL_TABLET | Freq: Every day | ORAL | Status: DC
  Administered 2020-02-09 – 2020-02-15 (×7): 2000 [IU] via ORAL
  Filled 2020-02-08 (×8): qty 2

## 2020-02-08 MED ORDER — DOCUSATE SODIUM 50 MG/5ML PO LIQD: 100.0000 mg | Freq: Every day | ORAL | Status: DC

## 2020-02-08 MED ORDER — FOLIC ACID 1 MG PO TABS: 1.0000 mg | ORAL_TABLET | Freq: Every day | ORAL | Status: DC

## 2020-02-08 MED ORDER — AMLODIPINE BESYLATE 10 MG PO TABS
10.0000 mg | ORAL_TABLET | Freq: Every day | ORAL | Status: DC
  Administered 2020-02-09 – 2020-02-15 (×7): 10 mg via ORAL
  Filled 2020-02-08 (×7): qty 1

## 2020-02-08 MED ORDER — IPRATROPIUM-ALBUTEROL 0.5-2.5 (3) MG/3ML IN SOLN: 3.0000 mL | Freq: Four times a day (QID) | RESPIRATORY_TRACT | Status: DC | PRN

## 2020-02-08 MED ORDER — AMLODIPINE BESYLATE 10 MG PO TABS
10.0000 mg | ORAL_TABLET | Freq: Every day | ORAL | Status: DC
Start: 2020-02-09 — End: 2020-02-15

## 2020-02-08 MED ORDER — ORAL CARE MOUTH RINSE
15.0000 mL | Freq: Two times a day (BID) | OROMUCOSAL | Status: DC
  Administered 2020-02-09 – 2020-02-13 (×10): 15 mL via OROMUCOSAL

## 2020-02-08 MED ORDER — BETHANECHOL CHLORIDE 10 MG PO TABS
10.0000 mg | ORAL_TABLET | Freq: Three times a day (TID) | ORAL | Status: DC
Start: 2020-02-08 — End: 2020-02-15

## 2020-02-08 MED ORDER — CLONAZEPAM 1 MG PO TBDP
1.0000 mg | ORAL_TABLET | Freq: Two times a day (BID) | ORAL | 0 refills | Status: DC
Start: 2020-02-08 — End: 2020-02-15

## 2020-02-08 MED ORDER — ADULT MULTIVITAMIN W/MINERALS CH: 1.0000 | ORAL_TABLET | Freq: Every day | ORAL | Status: DC

## 2020-02-08 MED ORDER — CLONAZEPAM 0.25 MG PO TBDP
1.0000 mg | ORAL_TABLET | Freq: Two times a day (BID) | ORAL | Status: DC
  Administered 2020-02-08 – 2020-02-13 (×10): 1 mg via ORAL
  Filled 2020-02-08 (×10): qty 4

## 2020-02-08 MED ORDER — MELATONIN 5 MG PO TABS
5.0000 mg | ORAL_TABLET | Freq: Every day | ORAL | 0 refills | Status: DC
Start: 2020-02-08 — End: 2020-02-15

## 2020-02-08 MED ORDER — ONDANSETRON HCL 4 MG/2ML IJ SOLN: 4.0000 mg | Freq: Four times a day (QID) | INTRAMUSCULAR | Status: DC | PRN

## 2020-02-08 MED ORDER — ALBUTEROL SULFATE (2.5 MG/3ML) 0.083% IN NEBU: 2.5000 mg | INHALATION_SOLUTION | RESPIRATORY_TRACT | Status: DC | PRN

## 2020-02-08 MED ORDER — ADULT MULTIVITAMIN W/MINERALS CH
1.0000 | ORAL_TABLET | Freq: Every day | ORAL | Status: DC
  Administered 2020-02-09 – 2020-02-15 (×7): 1 via ORAL
  Filled 2020-02-08 (×7): qty 1

## 2020-02-08 MED ORDER — QUETIAPINE FUMARATE 200 MG PO TABS
200.0000 mg | ORAL_TABLET | Freq: Two times a day (BID) | ORAL | Status: DC
  Administered 2020-02-08 – 2020-02-10 (×4): 200 mg via ORAL
  Filled 2020-02-08 (×4): qty 1

## 2020-02-08 MED ORDER — ARFORMOTEROL TARTRATE 15 MCG/2ML IN NEBU
15.0000 ug | INHALATION_SOLUTION | Freq: Two times a day (BID) | RESPIRATORY_TRACT | Status: DC
  Administered 2020-02-08 – 2020-02-15 (×13): 15 ug via RESPIRATORY_TRACT
  Filled 2020-02-08 (×15): qty 2

## 2020-02-08 MED ORDER — BETHANECHOL CHLORIDE 10 MG PO TABS
10.0000 mg | ORAL_TABLET | Freq: Three times a day (TID) | ORAL | Status: DC
  Administered 2020-02-08 – 2020-02-15 (×21): 10 mg via ORAL
  Filled 2020-02-08 (×21): qty 1

## 2020-02-08 MED ORDER — LORAZEPAM 2 MG/ML IJ SOLN
0.5000 mg | Freq: Three times a day (TID) | INTRAMUSCULAR | Status: DC | PRN
  Administered 2020-02-08: 0.5 mg via INTRAVENOUS
  Filled 2020-02-08: qty 1

## 2020-02-08 MED ORDER — FOLIC ACID 1 MG PO TABS
1.0000 mg | ORAL_TABLET | Freq: Every day | ORAL | Status: DC
  Administered 2020-02-09 – 2020-02-15 (×7): 1 mg via ORAL
  Filled 2020-02-08 (×7): qty 1

## 2020-02-08 MED ORDER — OXYCODONE HCL 5 MG/5ML PO SOLN: 5.0000 mg | ORAL | Status: DC | PRN

## 2020-02-08 MED ORDER — ENSURE ENLIVE PO LIQD
237.0000 mL | Freq: Two times a day (BID) | ORAL | Status: DC
  Administered 2020-02-09 – 2020-02-14 (×11): 237 mL via ORAL

## 2020-02-08 MED ORDER — APIXABAN 5 MG PO TABS
5.0000 mg | ORAL_TABLET | Freq: Two times a day (BID) | ORAL | Status: DC
Start: 2020-02-08 — End: 2020-02-15

## 2020-02-08 MED ORDER — PANTOPRAZOLE SODIUM 40 MG PO TBEC
40.0000 mg | DELAYED_RELEASE_TABLET | Freq: Two times a day (BID) | ORAL | Status: DC
  Administered 2020-02-08 – 2020-02-15 (×14): 40 mg via ORAL
  Filled 2020-02-08 (×14): qty 1

## 2020-02-08 MED ORDER — QUETIAPINE FUMARATE 200 MG PO TABS: 200.0000 mg | ORAL_TABLET | Freq: Two times a day (BID) | ORAL | Status: DC

## 2020-02-08 MED ORDER — AMIODARONE HCL 200 MG PO TABS
200.0000 mg | ORAL_TABLET | Freq: Every day | ORAL | Status: DC
  Administered 2020-02-09 – 2020-02-15 (×7): 200 mg via ORAL
  Filled 2020-02-08 (×7): qty 1

## 2020-02-08 MED ORDER — GERHARDT'S BUTT CREAM
TOPICAL_CREAM | CUTANEOUS | Status: DC | PRN
  Filled 2020-02-08: qty 1

## 2020-02-08 MED ORDER — THIAMINE HCL 100 MG PO TABS
100.0000 mg | ORAL_TABLET | Freq: Every day | ORAL | Status: DC
  Administered 2020-02-09 – 2020-02-15 (×7): 100 mg via ORAL
  Filled 2020-02-08 (×7): qty 1

## 2020-02-08 MED ORDER — MELATONIN 5 MG PO TABS: 5.0000 mg | ORAL_TABLET | Freq: Every day | ORAL | Status: DC

## 2020-02-08 MED ORDER — BUDESONIDE 0.25 MG/2ML IN SUSP
0.2500 mg | Freq: Two times a day (BID) | RESPIRATORY_TRACT | Status: DC
Start: 2020-02-08 — End: 2020-02-15
  Administered 2020-02-08 – 2020-02-15 (×12): 0.25 mg via RESPIRATORY_TRACT
  Filled 2020-02-08 (×16): qty 2

## 2020-02-08 MED ORDER — ONDANSETRON HCL 4 MG PO TABS: 4.0000 mg | ORAL_TABLET | Freq: Four times a day (QID) | ORAL | Status: DC | PRN

## 2020-02-08 NOTE — Progress Notes (Signed)
Patient arrived on unit, oriented to unit. Reviewed medications, therapy schedule, rehab routine and plan of care. Patient is agitated and trying to get out of bed. Telesitter ordered and patient instructed on use of call light. Patient reports no pain and is AX2 [employer]
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
## 2020-02-08 NOTE — Discharge Summary (Signed)
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 last 168 hours. No results for input(s): AMMONIA in the last 168 hours. CBC: Recent Labs  Lab 02/03/20 0206 02/06/20 0213 02/07/20 0432  WBC 8.2  --  8.9  HGB 13.2 14.1 14.2  HCT 41.7 43.9 44.0  MCV 100.0  --  99.8  PLT 200  --  266   Cardiac Enzymes: Recent Labs  Lab 02/06/20 0213  CKTOTAL 20*   BNP: Invalid input(s): POCBNP CBG: Recent Labs  Lab 02/07/20 2026 02/07/20 2336 02/08/20 0603 02/08/20 0751 02/08/20 1133  GLUCAP 107* 113* 105* 107* 127*   D-Dimer No results for input(s): DDIMER in the last 72 hours. Hgb A1c No results for input(s): HGBA1C in the last 72 hours. Lipid Profile No results for input(s): CHOL, HDL, LDLCALC, TRIG, CHOLHDL, LDLDIRECT in the last 72 hours. Thyroid function studies No results for input(s): TSH, T4TOTAL, T3FREE, THYROIDAB in the last 72 hours.  Invalid input(s): FREET3 Anemia work up No results for input(s): VITAMINB12, FOLATE, FERRITIN, TIBC, IRON, RETICCTPCT in the last 72 hours. Urinalysis    Component Value Date/Time   COLORURINE YELLOW 01/28/2020 1422   APPEARANCEUR HAZY (A) 01/28/2020 1422   LABSPEC 1.016 01/28/2020 1422   PHURINE 6.0 01/28/2020 1422   GLUCOSEU NEGATIVE  01/28/2020 1422   HGBUR LARGE (A) 01/28/2020 1422   BILIRUBINUR NEGATIVE 01/28/2020 1422   KETONESUR NEGATIVE 01/28/2020 1422   PROTEINUR 30 (A) 01/28/2020 1422   NITRITE POSITIVE (A) 01/28/2020 1422   LEUKOCYTESUR LARGE (A) 01/28/2020 1422   Sepsis Labs Invalid input(s): PROCALCITONIN,  WBC,  LACTICIDVEN Microbiology  No results found for this or any previous visit (from the past 240 hour(s)).   Time coordinating discharge: 35  minutes  SIGNED: Lanae Boast, MD  [facility] 02/08/2020, 12:23 PM  If 7PM-7AM, please contact night-coverage [email or web address]

## 2020-02-08 NOTE — Progress Notes (Signed)
Trach downsize ordered.  Without difficulty, a 6.0 uncuffed shiley trach was removed and a 4.0 uncuffed shiley was replaced.  Easy  cap color change was positive.  Patient tolerated well.

## 2020-02-08 NOTE — Progress Notes (Signed)
Inpatient Rehabilitation Medication Review by a Pharmacist  A complete drug regimen review was completed for this patient to identify any potential clinically significant medication issues.  Clinically significant medication issues were identified:  no  Check AMION for pharmacist assigned to patient if future medication questions/issues arise during this admission.  Pharmacist comments: Plan from d/c summary is to wean quetiapine. Bystolic, aspirin, diclofenac gel, meloxicam, hyoscyamine stopped.  Time spent performing this drug regimen review (minutes):  10   Thank you for involving pharmacy in this patient's care.  Loura Back, PharmD, BCPS Clinical Pharmacist Clinical phone for 02/08/2020 until 10p is [phone] 02/08/2020 5:53 PM  **Pharmacist phone directory can be found on [email or web address] listed under [facility]**

## 2020-02-08 NOTE — TOC Transition Note (Signed)
Transition of Care (TOC) - CM/SW Discharge Note Donn Pierini RN,BSN Transitions of Care Unit 4NP (non trauma) - RN Case Manager See Treatment Team for direct Phone #   Patient Details  Name: Roberto Lynch MRN: [id number] Date of Birth: 02/04/1956  Transition of Care [facility]) CM/SW Contact:  Darrold Span, RN Phone Number: 02/08/2020, 12:38 PM   Clinical Narrative:    Per [facility] with INPT rehab pt has insurance auth and bed available today. MDs have cleared pt to go to INPT rehab. Per Genie with [facility] INPT rehab plan will be to admit to rehab later today.    Final next level of care: IP Rehab Facility Barriers to Discharge: Barriers Resolved   Patient Goals and CMS Choice Patient states their goals for this hospitalization and ongoing recovery are:: rehab CMS Medicare.gov Compare Post Acute Care list provided to:: Patient Choice offered to / list presented to : Patient  Discharge Placement                  INPT rehab- [facility]      Discharge Plan and Services   Discharge Planning Services: CM Consult Post Acute Care Choice: IP Rehab                    HH Arranged: NA HH Agency: NA        Social Determinants of Health (SDOH) Interventions     Readmission Risk Interventions Readmission Risk Prevention Plan 02/08/2020  Transportation Screening Complete  PCP or Specialist Appt within 3-5 Days (No Data)  HRI or Home Care Consult Complete  Social Work Consult for Recovery Care Planning/Counseling Complete  Palliative Care Screening Not Applicable  Medication Review Oceanographer) Complete  Some recent data might be hidden
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
## 2020-02-08 NOTE — Progress Notes (Addendum)
IP rehab admissions - I have approval for CIR admission today.  Will check with MDs to see if patient is medically ready for admit today.  Call me for questions.  [phone]  I have clearance from PCCM and have discussed with attending.  Will admit to CIR today.  [phone]

## 2020-02-08 NOTE — Progress Notes (Signed)
Jamse Arn, MD  Physician  Physical Medicine and Rehabilitation  PMR Pre-admission    Signed  Date of Service:  02/05/2020 4:14 PM      Related encounter: ED to Hosp-Admission (Current) from 12/27/2019 in [place]       Show:Clear all [x] Manual[x] Template[x] Copied  Added by: [x] Lind Covert, Runell Gess, CCC-SLP[x] Retta Diones, RN[x] Jamse Arn, MD  [] Hover for details PMR Admission Coordinator Pre-Admission Assessment  Patient: Roberto Lynch is an 64 y.o., male MRN: [id number] DOB: 08/03/1955 Height: 5' 9"  (175.3 cm) Weight: 117.8 kg  Insurance Information HMO:     PPO: Yes     PCP:      IPA:      80/20:      OTHER:  PRIMARY: Generic [employer]      Policy#: [id number]      Subscriber: patient CM Name: [name]      Phone#: [phone]     Fax#: [phone] Pre-Cert#: [id number] from 02/08/20 to 02/14/20 with update due on 02/14/20      Employer: [employer] Benefits:  Phone #: [phone]     Name: [name]. Date: 04/22/19     Deduct: $2800 (met $2800)      Out of Pocket Max: $2800 (met $2800)      Life Max: N/A CIR: 100%      SNF: 100% with 60 day limit Outpatient: 100% with 60 visit limit     Co-Pay: none Home Health: 100% with 60 visit limit      Co-Pay: none DME: 100%     Co-Pay: none Providers: in-network  SECONDARY:       Policy#:      Phone#:   Development worker, community:       Phone#:   The Engineer, petroleum" for patients in Inpatient Rehabilitation Facilities with attached "Privacy Act [place] Records" was provided and verbally reviewed with: N/A  Emergency Contact Information         Contact Information    Name Relation Home Work Mobile   Hefley,GWENDOLYN Spouse [phone]  Morningside, Tat Momoli Brother   [phone]   Turner,Kayla Daughter [phone]   Wynne, Jury Father  [phone]        Current Medical History  Patient Admitting Diagnosis: Right talus fx, Resp failure, debility  History of Present Illness: A 64 y.o. male with a history of HTN, COPD, Hx Guillain-Barr syndrome with residual BLE neuropathy who presented to [facility] on 12/27/2019. Pt found to have R talus fx, possible R fibular head fx, scalp laceration, RUQ hematoma, superior R manubrium fx w/ fxs of the first costochondral junctions, R hydronephrosis, found to be in afib with RVR. Pt is now s/p ORIF of R talus fx on 9/7, extubated 9/12. pt now s/p trach and PEG placement on 10/4 by general surgery. Currently PEG is out and he is on a D2thin liquids diet.  PCCM says that oxygen will be weaned and then removal of trach will be considered at a later time.  PT/OT/SLP evaluations were completed with recommendations for inpatient rehab admission.  PT/OT/SLP evaluations were completed with recommendations for inpatient rehab admission.  Patient to be admitted to CIR today.   Patient's medical record from [facility] has been reviewed by the rehabilitation admission coordinator and physician.  Past Medical History      Past Medical History:  Diagnosis Date  . DVT (  deep venous thrombosis) ([facility])   . Guillain Barr syndrome ([place])   . Habitual alcohol use    1 pint whiskey dailey  . Hypertension   . Lower extremity neuropathy   . Nicotine dependence 12/28/2019  . Vitamin D deficiency 12/30/2019    Family History   family history includes High blood pressure in his brother and father.  Prior Rehab/Hospitalizations Has the patient had prior rehab or hospitalizations prior to admission? No  Has the patient had major surgery during 100 days prior to admission? Yes             Current Medications  Current Facility-Administered Medications:  .  0.9 %  sodium chloride infusion, , Intravenous, PRN, Saverio Danker, PA-C, Stopped at 01/07/20 0323 .  albuterol (PROVENTIL) (2.5 MG/3ML) 0.083%  nebulizer solution 2.5 mg, 2.5 mg, Nebulization, Q4H PRN, Audria Nine, DO .  amiodarone (PACERONE) tablet 200 mg, 200 mg, Per Tube, Daily, Saverio Danker, PA-C, 200 mg at 02/05/20 1201 .  amLODipine (NORVASC) tablet 10 mg, 10 mg, Per Tube, Daily, Gleason, Otilio Carpen, PA-C, 10 mg at 02/05/20 1204 .  apixaban (ELIQUIS) tablet 5 mg, 5 mg, Per Tube, BID, Jacky Kindle, MD, 5 mg at 02/05/20 1202 .  arformoterol (BROVANA) nebulizer solution 15 mcg, 15 mcg, Nebulization, Q12H, Saverio Danker, PA-C, 15 mcg at 02/05/20 0816 .  bethanechol (URECHOLINE) tablet 10 mg, 10 mg, Per Tube, TID, Gonfa, Taye T, MD .  budesonide (PULMICORT) nebulizer solution 0.25 mg, 0.25 mg, Nebulization, Q12H, Julian Hy, DO, 0.25 mg at 02/05/20 0816 .  chlorhexidine gluconate (MEDLINE KIT) (PERIDEX) 0.12 % solution 15 mL, 15 mL, Mouth Rinse, BID, Saverio Danker, PA-C, 15 mL at 02/05/20 1205 .  Chlorhexidine Gluconate Cloth 2 % PADS 6 each, 6 each, Topical, Daily, Saverio Danker, PA-C, 6 each at 02/05/20 1205 .  clonazePAM (KLONOPIN) disintegrating tablet 0.5 mg, 0.5 mg, Per Tube, BID, Cyndia Skeeters, Taye T, MD, 0.5 mg at 02/05/20 1203 .  docusate (COLACE) 50 MG/5ML liquid 100 mg, 100 mg, Per Tube, Daily, Saverio Danker, PA-C, 100 mg at 02/02/20 1009 .  ergocalciferol (DRISDOL) 200 MCG/ML drops 8,000 Units, 8,000 Units, Per Tube, Daily, Jacky Kindle, MD, 8,000 Units at 02/05/20 1201 .  feeding supplement (OSMOLITE 1.5 CAL) liquid 1,000 mL, 1,000 mL, Per Tube, Continuous, Gonfa, Taye T, MD, Last Rate: 90 mL/hr at 02/04/20 2113, 1,000 mL at 02/04/20 2113 .  feeding supplement (PROSource TF) liquid 45 mL, 45 mL, Per Tube, BID, Gonfa, Taye T, MD, 45 mL at 02/05/20 1206 .  folic acid (FOLVITE) tablet 1 mg, 1 mg, Per Tube, Daily, Saverio Danker, PA-C, 1 mg at 02/05/20 1202 .  free water 100 mL, 100 mL, Per Tube, Q4H, Marshall, Jessica, DO, 100 mL at 02/05/20 1256 .  Gerhardt's butt cream, , Topical, PRN, Gonfa, Taye T, MD .  guaiFENesin  (ROBITUSSIN) 100 MG/5ML solution 100 mg, 5 mL, Oral, Q6H, Sampson Goon, MD, 100 mg at 02/05/20 1206 .  haloperidol lactate (HALDOL) injection 5 mg, 5 mg, Intravenous, Q6H PRN, Merlene Laughter F, NP, 5 mg at 02/05/20 1418 .  hydrALAZINE (APRESOLINE) injection 5 mg, 5 mg, Intravenous, Q6H PRN, Audria Nine, DO .  ipratropium-albuterol (DUONEB) 0.5-2.5 (3) MG/3ML nebulizer solution 3 mL, 3 mL, Nebulization, Q6H PRN, Saverio Danker, PA-C, 3 mL at 02/03/20 2038 .  MEDLINE mouth rinse, 15 mL, Mouth Rinse, q12n4p, Gonfa, Taye T, MD, 15 mL at 02/05/20 1206 .  melatonin tablet 3 mg, 3 mg, Oral, QHS, Rosana Hoes,  Guinevere Scarlet, NP, 3 mg at 02/04/20 2112 .  multivitamin with minerals tablet 1 tablet, 1 tablet, Per Tube, Daily, Saverio Danker, PA-C, 1 tablet at 02/05/20 1202 .  ondansetron (ZOFRAN) tablet 4 mg, 4 mg, Oral, Q6H PRN, 4 mg at 01/13/20 2213 **OR** ondansetron (ZOFRAN) injection 4 mg, 4 mg, Intravenous, Q6H PRN, Saverio Danker, PA-C, 4 mg at 01/05/20 2121 .  oxyCODONE (ROXICODONE) 5 MG/5ML solution 5 mg, 5 mg, Per Tube, Q6H PRN, Cyndia Skeeters, Taye T, MD .  pantoprazole sodium (PROTONIX) 40 mg/20 mL oral suspension 40 mg, 40 mg, Per Tube, BID, Saverio Danker, PA-C, 40 mg at 02/05/20 1207 .  polyethylene glycol (MIRALAX / GLYCOLAX) packet 17 g, 17 g, Per Tube, Daily, Saverio Danker, PA-C, 17 g at 02/05/20 1200 .  QUEtiapine (SEROQUEL) tablet 200 mg, 200 mg, Per Tube, BID, Gonfa, Taye T, MD .  sodium chloride flush (NS) 0.9 % injection 10-40 mL, 10-40 mL, Intracatheter, PRN, Saverio Danker, PA-C .  sodium chloride HYPERTONIC 3 % nebulizer solution 4 mL, 4 mL, Nebulization, BID, Altamese Smoot, MD, 4 mL at 02/05/20 0816 .  thiamine tablet 100 mg, 100 mg, Per Tube, Daily, 100 mg at 02/05/20 1208 **OR** [DISCONTINUED] thiamine (B-1) injection 100 mg, 100 mg, Intravenous, Daily, Ainsley Spinner, PA-C  Patients Current Diet:     Diet Order                  DIET DYS 2 Room service appropriate? Yes with Assist;  Fluid consistency: Thin  Diet effective now                  Precautions / Restrictions Precautions Precautions: Fall Precaution Comments: peg tube, trach,  Restrictions Weight Bearing Restrictions: Yes RLE Weight Bearing: Non weight bearing Other Position/Activity Restrictions: casted   Has the patient had 2 or more falls or a fall with injury in the past year? No  Prior Activity Level Community (5-7x/wk): driving and working  Prior Functional Level Self Care: Did the patient need help bathing, dressing, using the toilet or eating? Independent  Indoor Mobility: Did the patient need assistance with walking from room to room (with or without device)? Independent  Stairs: Did the patient need assistance with internal or external stairs (with or without device)? Independent  Functional Cognition: Did the patient need help planning regular tasks such as shopping or remembering to take medications? Independent  Home Assistive Devices / Equipment Home Assistive Devices/Equipment: None  Prior Device Use: Indicate devices/aids used by the patient prior to current illness, exacerbation or injury? None of the above  Current Functional Level Cognition  Arousal/Alertness: Awake/alert Overall Cognitive Status: Impaired/Different from baseline Difficult to assess due to: Tracheostomy Current Attention Level: Sustained Orientation Level: Oriented to person Following Commands: Follows one step commands consistently Safety/Judgement: Decreased awareness of safety, Decreased awareness of deficits General Comments: pt making jokes with therapy staff and brother, remains impulsive but easily redirected Attention: Focused Focused Attention: Impaired Focused Attention Impairment: Functional basic Memory Impairment: Retrieval deficit Behaviors: Restless Safety/Judgment: Impaired    Extremity Assessment (includes Sensation/Coordination)  Upper Extremity Assessment: RUE  deficits/detail, LUE deficits/detail, Generalized weakness RUE Deficits / Details: PROM WFL no active movement but in bil UE restraints and mittens LUE Deficits / Details: PROM WFL no active movement in restraints and mittens  Lower Extremity Assessment: Defer to PT evaluation    ADLs  Overall ADL's : Needs assistance/impaired Eating/Feeding: NPO Grooming: Total assistance Upper Body Bathing: Total assistance Lower Body Bathing: Total assistance  Upper Body Dressing : Total assistance Lower Body Dressing: Total assistance General ADL Comments: session focus on lifting to wheelchair and maneuvering in hallway    Mobility  Overal bed mobility: Needs Assistance Bed Mobility: Supine to Sit, Sit to Supine Rolling: Max assist, +2 for physical assistance, +2 for safety/equipment Sidelying to sit: +2 for physical assistance, Total assist Supine to sit: Mod assist, HOB elevated Sit to supine: Mod assist General bed mobility comments: pt able to use bedrails to pull up in bed, requires cues to maintain NWB RLE    Transfers  Overall transfer level: Needs assistance Equipment used: 1 person hand held assist Transfer via Lift Equipment: Maximove Transfers: Sit to/from Stand Sit to Stand: Max assist, From elevated surface General transfer comment: PT provides BUE support and L knee block, PT facilitating weight shift toward L side to maintain NWB RLE. Pt performs 3 sit to stands    Ambulation / Gait / Stairs / Emergency planning/management officer  Ambulation/Gait General Gait Details: unable due to R LE NWB Wheelchair Mobility Wheelchair mobility: Yes Wheelchair propulsion: Both upper extremities Wheelchair parts: Needs assistance Distance: 20 (additional trial of 10' x2) Wheelchair Assistance Details (indicate cue type and reason): pt requires assistance for management of leg rests, cues for improved propulsion technique and to avoid injuring fingers against walls when mobilizing    Posture /  Balance Dynamic Sitting Balance Sitting balance - Comments: minG with UE support of rail or BUE support of bed Balance Overall balance assessment: Needs assistance Sitting-balance support: Single extremity supported, Feet supported Sitting balance-Leahy Scale: Poor Sitting balance - Comments: minG with UE support of rail or BUE support of bed Postural control: Posterior lean Standing balance-Leahy Scale: Zero Standing balance comment: maxA with BUE support of PT and L knee block    Special needs/care consideration Oxygen 5L 28%, Trach size Shiley #6 cuffless, Skin Ecchymosis: scattered/bilateral; Catheter: Left abdomen; Surgical incision: Right leg; Wound: moisture associated skin damage perineum, External urinary catheter; Bowel incontinence; Peg tube was pulled out by patient and then left our, on D2thin diet now and Designated visitor Baylee Mccorkel, wife   Previous Home Environment (from acute therapy documentation) Living Arrangements: Spouse/significant other, Children Available Help at Discharge: Family Type of Home: House Home Layout: One level Home Access: Ramped entrance Bathroom Shower/Tub: Chiropodist: Standard (has bedside commode) Bathroom Accessibility: Yes How Accessible: Accessible via wheelchair, Accessible via [place]: No Additional Comments: due to decreased participation unable to fully assess functional mobility; pt unable to answer questions regarding home, no family present  Discharge Living Setting Plans for Discharge Living Setting: Patient's home Type of Home at Discharge: House Discharge Home Layout: One level Discharge Home Access: [place] entrance Discharge Bathroom Shower/Tub: [place] unit Discharge Bathroom Toilet: Standard (has bedside commode) Discharge Bathroom Accessibility: Yes How Accessible: Accessible via wheelchair, Accessible via walker Does the patient have any problems obtaining your  medications?: No  Social/Family/Support Systems Patient Roles: Spouse, Parent Anticipated Caregiver: Dametrius Sanjuan, wife (niece, son, daughter will also be caregivers) Anticipated Caregiver's Contact Information: [phone] Discharge Plan Discussed with Primary Caregiver: Yes Is Caregiver In Agreement with Plan?: Yes Does Caregiver/Family have Issues with Lodging/Transportation while Pt is in Rehab?: No  Goals Pt/Family Agrees to Admission and willing to participate: Yes Program Orientation Provided & Reviewed with Pt/Caregiver Including Roles  & Responsibilities: Yes  Decrease burden of Care through IP rehab admission: NA          Possible need for SNF placement  upon discharge: NA  Patient Condition: I have reviewed medical records from [facility], spoken with CSW, and patient and spouse. I met with patient at the bedside for inpatient rehabilitation assessment.  Patient will benefit from ongoing PT, OT and SLP, can actively participate in 3 hours of therapy a day 5 days of the week, and can make measurable gains during the admission.  Patient will also benefit from the coordinated team approach during an Inpatient Acute Rehabilitation admission.  The patient will receive intensive therapy as well as Rehabilitation physician, nursing, social worker, and care management interventions.  Due to bladder management, bowel management, safety, skin/wound care, disease management, medication administration, pain management and patient education the patient requires 24 hour a day rehabilitation nursing.  The patient is currently mod assis with mobility and mod/max assist with basic ADLs.  Discharge setting and therapy post discharge at home with home health is anticipated.  Patient has agreed to participate in the Acute Inpatient Rehabilitation Program and will admit today.  Preadmission Screen Completed By:  Bethel Born, 02/05/2020 4:15 PM and updated by Karene Fry, RN  02/08/20 ______________________________________________________________________   Discussed status with Dr. Posey Pronto on 02/08/20 at 47 and received approval for admission today.  Admission Coordinator:  Bethel Born, [place], time 1209/Date 02/08/20 and Karene Fry, RN 02/08/20   Assessment/Plan: Diagnosis: Polytrauma 1. Does the need for close, 24 hr/day Medical supervision in concert with the patient's rehab needs make it unreasonable for this patient to be served in a less intensive setting? Yes 2. Co-Morbidities requiring supervision/potential complications: HTN (monitor and provide prns in accordance with increased physical exertion and pain), COPD, Hx Guillain-Barr syndrome with residual BLE neuropathy 3. Due to bladder management, bowel management, safety, skin/wound care, disease management, medication administration and patient education, does the patient require 24 hr/day rehab nursing? Yes 4. Does the patient require coordinated care of a physician, rehab nurse, PT, OT, and SLP to address physical and functional deficits in the context of the above medical diagnosis(es)? Yes Addressing deficits in the following areas: balance, endurance, locomotion, strength, transferring, bowel/bladder control, bathing, dressing, toileting, speech, swallowing and psychosocial support 5. Can the patient actively participate in an intensive therapy program of at least 3 hrs of therapy 5 days a week? Yes 6. The potential for patient to make measurable gains while on inpatient rehab is excellent 7. Anticipated functional outcomes upon discharge from inpatient rehab: supervision and min assist PT, supervision and min assist OT, modified independent and supervision SLP 8. Estimated rehab length of stay to reach the above functional goals is: 13-17 days. 9. Anticipated discharge destination: Home 10. Overall Rehab/Functional Prognosis: good   MD Signature: Delice Lesch, MD, ABPMR         Revision History                                         Note Details  Author Jamse Arn, MD File Time 02/08/2020 12:40 PM  Author Type Physician Status Signed  Last Editor Jamse Arn, MD Service Physical Medicine and Rehabilitation
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
## 2020-02-08 NOTE — Progress Notes (Signed)
Nurse walked into the room and the patient was on all fours in the bed attempting to scoot out of the bed. When nurse asked the patient to sit back down, he stated "I am leaving and getting out of here, lower the damn rail!" PRN ativan given with no results. Pt transferred via wheelchair to 4W for room closer to nurses station and locked unit, patient calling all family members while at nurses station telling them to "come pick me up". Report given to Abundio Miu, RN

## 2020-02-08 NOTE — Progress Notes (Signed)
Spoke with patients wife Gwen regarding Peg. I informed Gewn that the peg will not be replaced at this time. Pt. PO intake is excellent. We will continue to monitor pt intake. Gwen verbalizes understanding of the information and was encouraged to reach out for any other question that may arise.

## 2020-02-08 NOTE — Progress Notes (Signed)
Patient transferred to CIR via wheelchair along with all belongins. Wife Dedra Skeens is at the bedside accompanying patient to CIR. Nurse was notified of patients arrival to room.

## 2020-02-09 ENCOUNTER — Inpatient Hospital Stay (HOSPITAL_COMMUNITY): Payer: 59

## 2020-02-09 ENCOUNTER — Inpatient Hospital Stay (HOSPITAL_COMMUNITY): Payer: 59 | Admitting: Speech Pathology

## 2020-02-09 ENCOUNTER — Inpatient Hospital Stay (HOSPITAL_COMMUNITY): Payer: 59 | Admitting: Physical Therapy

## 2020-02-09 DIAGNOSIS — J9622 Acute and chronic respiratory failure with hypercapnia: Secondary | ICD-10-CM

## 2020-02-09 DIAGNOSIS — T07XXXA Unspecified multiple injuries, initial encounter: Secondary | ICD-10-CM

## 2020-02-09 DIAGNOSIS — J9612 Chronic respiratory failure with hypercapnia: Secondary | ICD-10-CM

## 2020-02-09 DIAGNOSIS — R1310 Dysphagia, unspecified: Secondary | ICD-10-CM

## 2020-02-09 DIAGNOSIS — K7290 Hepatic failure, unspecified without coma: Secondary | ICD-10-CM

## 2020-02-09 DIAGNOSIS — I480 Paroxysmal atrial fibrillation: Secondary | ICD-10-CM

## 2020-02-09 DIAGNOSIS — I4891 Unspecified atrial fibrillation: Secondary | ICD-10-CM

## 2020-02-09 DIAGNOSIS — E7220 Disorder of urea cycle metabolism, unspecified: Secondary | ICD-10-CM

## 2020-02-09 DIAGNOSIS — Z930 Tracheostomy status: Secondary | ICD-10-CM

## 2020-02-09 LAB — CBC WITH DIFFERENTIAL/PLATELET
Abs Immature Granulocytes: 0.11 10*3/uL — ABNORMAL HIGH (ref 0.00–0.07)
Basophils Absolute: 0.1 10*3/uL (ref 0.0–0.1)
Basophils Relative: 1 %
Eosinophils Absolute: 0.4 10*3/uL (ref 0.0–0.5)
Eosinophils Relative: 5 %
HCT: 44.7 % (ref 39.0–52.0)
Hemoglobin: 14.4 g/dL (ref 13.0–17.0)
Immature Granulocytes: 1 %
Lymphocytes Relative: 22 %
Lymphs Abs: 2 10*3/uL (ref 0.7–4.0)
MCH: 31.9 pg (ref 26.0–34.0)
MCHC: 32.2 g/dL (ref 30.0–36.0)
MCV: 99.1 fL (ref 80.0–100.0)
Monocytes Absolute: 0.7 10*3/uL (ref 0.1–1.0)
Monocytes Relative: 8 %
Neutro Abs: 5.7 10*3/uL (ref 1.7–7.7)
Neutrophils Relative %: 63 %
Platelets: 304 10*3/uL (ref 150–400)
RBC: 4.51 MIL/uL (ref 4.22–5.81)
RDW: 13.4 % (ref 11.5–15.5)
WBC: 9 10*3/uL (ref 4.0–10.5)
nRBC: 0 % (ref 0.0–0.2)

## 2020-02-09 LAB — COMPREHENSIVE METABOLIC PANEL
ALT: 50 U/L — ABNORMAL HIGH (ref 0–44)
AST: 31 U/L (ref 15–41)
Albumin: 3.4 g/dL — ABNORMAL LOW (ref 3.5–5.0)
Alkaline Phosphatase: 106 U/L (ref 38–126)
Anion gap: 11 (ref 5–15)
BUN: 13 mg/dL (ref 8–23)
CO2: 26 mmol/L (ref 22–32)
Calcium: 10.4 mg/dL — ABNORMAL HIGH (ref 8.9–10.3)
Chloride: 101 mmol/L (ref 98–111)
Creatinine, Ser: 0.99 mg/dL (ref 0.61–1.24)
GFR, Estimated: >60 mL/min (ref >60)
Glucose, Bld: 126 mg/dL — ABNORMAL HIGH (ref 70–99)
Potassium: 4 mmol/L (ref 3.5–5.1)
Sodium: 138 mmol/L (ref 135–145)
Total Bilirubin: 0.6 mg/dL (ref 0.3–1.2)
Total Protein: 7.2 g/dL (ref 6.5–8.1)

## 2020-02-09 LAB — AMMONIA: Ammonia: 41 umol/L — ABNORMAL HIGH (ref 9–35)

## 2020-02-09 MED ORDER — MELATONIN 5 MG PO TABS
10.0000 mg | ORAL_TABLET | Freq: Every day | ORAL | Status: DC
  Administered 2020-02-09 – 2020-02-14 (×6): 10 mg via ORAL
  Filled 2020-02-09 (×6): qty 2

## 2020-02-09 MED ORDER — LACTULOSE 10 GM/15ML PO SOLN
10.0000 g | Freq: Three times a day (TID) | ORAL | Status: DC
  Administered 2020-02-09 – 2020-02-11 (×6): 10 g via ORAL
  Filled 2020-02-09 (×6): qty 15

## 2020-02-09 MED ORDER — ACETAMINOPHEN 325 MG PO TABS
650.0000 mg | ORAL_TABLET | ORAL | Status: DC | PRN
  Administered 2020-02-10: 650 mg via ORAL
  Filled 2020-02-09 (×2): qty 2

## 2020-02-09 MED ORDER — ALUM & MAG HYDROXIDE-SIMETH 200-200-20 MG/5ML PO SUSP: 30.0000 mL | ORAL | Status: DC | PRN
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
## 2020-02-09 NOTE — Progress Notes (Signed)
Occupational Therapy Session Note  Patient Details  Name: Roberto Lynch MRN: [id number] Date of Birth: February 01, 1956  Today's Date: 02/09/2020 OT Individual Time: 1535-1600 OT Individual Time Calculation (min): 25 min    Short Term Goals: Week 1:  OT Short Term Goal 1 (Week 1): Pt will transfer with LRAD and 1 person to decrease BOC OT Short Term Goal 2 (Week 1): PT will maintain NWB precautions with MOD VC during ADLs/mobility OT Short Term Goal 3 (Week 1): Pt will thread BLE into pants with S and AE PRN  Skilled Therapeutic Interventions/Progress Updates:  1;1. Pt received in bed agreeable to OT after just being cleaned up by a large incontinent BM. Pt completes supine>sitting with HOB elevated and baed rails with S. OT reviews sock aide for donning L sock with demo. Pt completes donning sock 2 trials with max fading to mod VC for AE use. Exited session with pt seated in bed, exit alarm on and call light in reach  Therapy Documentation Precautions:  Precautions Precaution Comments: trach (with PMV) Required Braces or Orthoses: Splint/Cast Splint/Cast: R LE Restrictions Weight Bearing Restrictions: Yes RLE Weight Bearing: Non weight bearing General: General Chart Reviewed: Yes Family/Caregiver Present: No Vital Signs: Therapy Vitals Pulse Rate: 85 Oxygen Therapy SpO2: 95 % O2 Device: Room Air Pain: Pain Assessment Pain Score: 0-No pain ADL: ADL Eating: Set up Where Assessed-Eating: Bed level Grooming: Supervision/safety Where Assessed-Grooming: Edge of bed Upper Body Bathing: Supervision/safety Where Assessed-Upper Body Bathing: Other (Comment) (BSC) Lower Body Bathing: Moderate assistance Where Assessed-Lower Body Bathing: Other (Comment) (BSC) Upper Body Dressing: Setup (BSC) Lower Body Dressing: Moderate assistance Where Assessed-Lower Body Dressing: Bed level, Edge of bed Toileting: Dependent Toilet Transfer: Dependent (+2 to maintain NWB precautions) Toilet  Transfer Method: Neurosurgeon Equipment: Drop arm bedside commode, Extra wide drop arm bedside commode Vision Baseline Vision/History: Wears glasses Wears Glasses: At all times Perception  Perception: Within Functional Limits Praxis Praxis: Intact Exercises:   Other Treatments:     Therapy/Group: Individual Therapy  Shon Hale 02/09/2020, 12:39 PM

## 2020-02-09 NOTE — Progress Notes (Signed)
Inpatient Rehabilitation Center Individual Statement of Services  Patient Name:  XZAVION DOSWELL  Date:  02/09/2020  Welcome to the Inpatient Rehabilitation Center.  Our goal is to provide you with an individualized program based on your diagnosis and situation, designed to meet your specific needs.  With this comprehensive rehabilitation program, you will be expected to participate in at least 3 hours of rehabilitation therapies Monday-Friday, with modified therapy programming on the weekends.  Your rehabilitation program will include the following services:  Physical Therapy (PT), Occupational Therapy (OT), Speech Therapy (ST), 24 hour per day rehabilitation nursing, Neuropsychology, Care Coordinator, Rehabilitation Medicine, Nutrition Services and Pharmacy Services  Weekly team conferences will be held on Wednesday to discuss your progress.  Your Inpatient Rehabilitation Care Coordinator will talk with you frequently to get your input and to update you on team discussions.  Team conferences with you and your family in attendance may also be held.  Expected length of stay: 17-21 days Overall anticipated outcome: supervision-CGA level  Depending on your progress and recovery, your program may change. Your Inpatient Rehabilitation Care Coordinator will coordinate services and will keep you informed of any changes. Your Inpatient Rehabilitation Care Coordinator's name and contact numbers are listed  below.  The following services may also be recommended but are not provided by the Inpatient Rehabilitation Center:   Driving Evaluations  Home Health Rehabiltiation Services  Outpatient Rehabilitation Services  Vocational Rehabilitation   Arrangements will be made to provide these services after discharge if needed.  Arrangements include referral to agencies that provide these services.  Your insurance has been verified to be:  [place] Your primary doctor is:  Joette Catching  Pertinent  information will be shared with your doctor and your insurance company.  Inpatient Rehabilitation Care Coordinator:  Dossie Der, [place] [phone] or Luna Glasgow  Information discussed with and copy given to patient by: Lucy Chris, 02/09/2020, 1:49 PM
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
## 2020-02-09 NOTE — Plan of Care (Signed)
  Problem: Consults Goal: RH GENERAL PATIENT EDUCATION Description: See Patient Education module for education specifics. Outcome: Progressing Goal: Skin Care Protocol Initiated - if Braden Score 18 or less Description: Skin will remain intact during hospital stay. Outcome: Progressing Goal: Nutrition Consult-if indicated Outcome: Progressing   Problem: RH BOWEL ELIMINATION Goal: RH STG MANAGE BOWEL WITH ASSISTANCE Description: Patient will manage bowels with assistance Outcome: Progressing Goal: RH STG MANAGE BOWEL W/MEDICATION W/ASSISTANCE Description: STG Manage Bowel with Medication with Assistance. Outcome: Progressing   Problem: RH BLADDER ELIMINATION Goal: RH STG MANAGE BLADDER WITH ASSISTANCE Description: STG Manage Bladder With Assistance Outcome: Progressing Goal: RH STG MANAGE BLADDER WITH EQUIPMENT WITH ASSISTANCE Description: STG Manage Bladder With Equipment With Assistance Outcome: Progressing   Problem: RH SKIN INTEGRITY Goal: RH STG SKIN FREE OF INFECTION/BREAKDOWN Description: Patient's skin will remain intact during hospital admission. Outcome: Progressing Goal: RH STG MAINTAIN SKIN INTEGRITY WITH ASSISTANCE Description: STG Maintain Skin Integrity With Assistance. Outcome: Progressing   Problem: RH SAFETY Goal: RH STG ADHERE TO SAFETY PRECAUTIONS W/ASSISTANCE/DEVICE Description: STG Adhere to Safety Precautions With Assistance/Device. Outcome: Progressing Goal: RH STG DECREASED RISK OF FALL WITH ASSISTANCE Description: STG Decreased Risk of Fall With Assistance. Outcome: Progressing   Problem: RH PAIN MANAGEMENT Goal: RH STG PAIN MANAGED AT OR BELOW [name] PAIN GOAL Outcome: Progressing
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
## 2020-02-09 NOTE — Progress Notes (Signed)
Inpatient Rehabilitation  Patient information reviewed and entered into eRehab system by Summerlynn Glauser M. Derek Huneycutt, M.A., CCC/SLP, PPS Coordinator.  Information including medical coding, functional ability and quality indicators will be reviewed and updated through discharge.    

## 2020-02-09 NOTE — Progress Notes (Signed)
   02/09/20 0658  What Happened  Was fall witnessed? Yes  Who witnessed fall? Sherylann Vangorden RN  Patients activity before fall ambulating-unassisted (Patient was slide himself out the bed)  Point of contact other (comment) (knees)  Was patient injured? No  Follow Up  MD notified Dan Anguila  Time MD notified 0731  Family notified Yes - comment (Gwen)  Time family notified 0731  Additional tests No  Simple treatment Other (comment) (assess)  Progress note created (see row info) Yes  Adult Fall Risk Assessment  Risk Factor Category (scoring not indicated) Not Applicable  Age 3  Fall History: Fall within 6 months prior to admission 0  Elimination; Bowel and/or Urine Incontinence 2  Elimination; Bowel and/or Urine Urgency/Frequency 2  Medications: includes PCA/Opiates, Anti-convulsants, Anti-hypertensives, Diuretics, Hypnotics, Laxatives, Sedatives, and Psychotropics 5  Patient Care Equipment 1  Mobility-Assistance 2  Mobility-Gait 2  Mobility-Sensory Deficit 2  Altered awareness of immediate physical environment 1  Impulsiveness 2  Lack of understanding of one's physical/cognitive limitations 4  Total Score 24  Patient Fall Risk Level High fall risk  Adult Fall Risk Interventions  Required Bundle Interventions *See Row Information* High fall risk - low, moderate, and high requirements implemented  Additional Interventions Use of appropriate toileting equipment (bedpan, BSC, etc.);Camera surveillance (with patient/family notification & education);Room near nurses station  Screening for Fall Injury Risk (To be completed on HIGH fall risk patients) - Assessing Need for Low Bed  Risk For Fall Injury- Low Bed Criteria TeleSitter Camera in use  Will Implement Low Bed and Floor Mats Yes  Screening for Fall Injury Risk (To be completed on HIGH fall risk patients who do not meet crieteria for Low Bed) - Assessing Need for Floor Mats Only  Risk For Fall Injury- Criteria for Floor Mats  Confusion/dementia (+NuDESC, CIWA, TBI, etc.)  Will Implement Floor Mats Yes  Vitals  Temp 98.9 F (37.2 C)  BP (!) 142/81  MAP (mmHg) 98  BP Location Left Arm  BP Method Automatic  Patient Position (if appropriate) Lying  Pulse Rate 91  Pulse Rate Source Monitor  Resp 20  Oxygen Therapy  SpO2 95 %  O2 Device Room Air  Pain Assessment  Pain Scale 0-10  Pain Score 0  Neurological  Neuro (WDL) X  Level of Consciousness Alert  Orientation Level Oriented to person;Oriented to place  Cognition Impulsive;Poor judgement;Poor attention/concentration;Poor safety awareness  Speech Clear   

## 2020-02-09 NOTE — Progress Notes (Deleted)
   02/09/20 0658  What Happened  Was fall witnessed? Yes  Who witnessed fall? Abundio Miu RN  Patients activity before fall ambulating-unassisted (Patient was slide himself out the bed)  Point of contact other (comment) (knees)  Was patient injured? No  Follow Up  MD notified Madaline Savage  Time MD notified [phone]  Family notified Yes - comment Dedra Skeens)  Time family notified [phone]  Additional tests No  Simple treatment Other (comment) (assess)  Progress note created (see row info) Yes  Adult Fall Risk Assessment  Risk Factor Category (scoring not indicated) Not Applicable  Age 64  Fall History: Fall within 6 months prior to admission 0  Elimination; Bowel and/or Urine Incontinence 2  Elimination; Bowel and/or Urine Urgency/Frequency 2  Medications: includes PCA/Opiates, Anti-convulsants, Anti-hypertensives, Diuretics, Hypnotics, Laxatives, Sedatives, and Psychotropics 5  Patient Care Equipment 1  Mobility-Assistance 2  Mobility-Gait 2  Mobility-Sensory Deficit 2  Altered awareness of immediate physical environment 1  Impulsiveness 2  Lack of understanding of one's physical/cognitive limitations 4  Total Score 24  Patient Fall Risk Level High fall risk  Adult Fall Risk Interventions  Required Bundle Interventions *See Row Information* High fall risk - low, moderate, and high requirements implemented  Additional Interventions Use of appropriate toileting equipment (bedpan, BSC, etc.);Camera surveillance (with patient/family notification & education);Room near nurses station  Screening for Fall Injury Risk (To be completed on HIGH fall risk patients) - Assessing Need for Low Bed  Risk For Fall Injury- Low Bed Criteria TeleSitter Camera in use  Will Implement Low Bed and Floor Mats Yes  Screening for Fall Injury Risk (To be completed on HIGH fall risk patients who do not meet crieteria for Low Bed) - Assessing Need for Floor Mats Only  Risk For Fall Injury- Criteria for Floor Mats  Confusion/dementia (+NuDESC, CIWA, TBI, etc.)  Will Implement Floor Mats Yes  Vitals  Temp 98.9 F (37.2 C)  BP (!) 142/81  MAP (mmHg) 98  BP Location Left Arm  BP Method Automatic  Patient Position (if appropriate) Lying  Pulse Rate 91  Pulse Rate Source Monitor  Resp 20  Oxygen Therapy  SpO2 95 %  O2 Device Room Air  Pain Assessment  Pain Scale 0-10  Pain Score 0  Neurological  Neuro (WDL) X  Level of Consciousness Alert  Orientation Level Oriented to person;Oriented to place  Cognition Impulsive;Poor judgement;Poor attention/concentration;Poor safety awareness  Speech Clear
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
## 2020-02-09 NOTE — Progress Notes (Signed)
Nursing reports rough night --patient restless and was up most of the night. He tried to walk this am and slid to the floor. Also has been standing unassisted on acute per reports. Attempted to reinforce safety precautions with patient who was tangential and hard to redirect. He has broken his ankle before and "knows what to do" "walked on a hard boot". He denies any cognitive issues with poor insight and lack of awareness of deficits. Does not feel that there is anything wrong with his memory. Will order X ray for work up.

## 2020-02-10 ENCOUNTER — Inpatient Hospital Stay (HOSPITAL_COMMUNITY): Payer: 59 | Admitting: Occupational Therapy

## 2020-02-10 ENCOUNTER — Inpatient Hospital Stay (HOSPITAL_COMMUNITY): Payer: 59 | Admitting: Speech Pathology

## 2020-02-10 ENCOUNTER — Inpatient Hospital Stay (HOSPITAL_COMMUNITY): Payer: 59 | Admitting: Physical Therapy

## 2020-02-10 DIAGNOSIS — F05 Delirium due to known physiological condition: Secondary | ICD-10-CM

## 2020-02-10 MED ORDER — ZOLPIDEM TARTRATE 5 MG PO TABS
2.5000 mg | ORAL_TABLET | Freq: Every evening | ORAL | Status: DC | PRN
  Administered 2020-02-10: 2.5 mg via ORAL
  Filled 2020-02-10: qty 1

## 2020-02-10 MED ORDER — QUETIAPINE FUMARATE 200 MG PO TABS
200.0000 mg | ORAL_TABLET | Freq: Every day | ORAL | Status: DC
  Administered 2020-02-11 – 2020-02-12 (×2): 200 mg via ORAL
  Filled 2020-02-10 (×2): qty 1

## 2020-02-10 NOTE — Progress Notes (Signed)
Occupational Therapy Session Note  Patient Details  Name: Roberto Lynch MRN: [id number] Date of Birth: [birth year]  Today's Date: 02/10/2020 OT Individual Time: 1135-1220 OT Individual Time Calculation (min): 45 min  and Today's Date: 02/10/2020 OT Missed Time: 30 Minutes Missed Time Reason: Patient fatigue   Short Term Goals: Week 1:  OT Short Term Goal 1 (Week 1): Pt will transfer with LRAD and 1 person to decrease BOC OT Short Term Goal 2 (Week 1): PT will maintain NWB precautions with MOD VC during ADLs/mobility OT Short Term Goal 3 (Week 1): Pt will thread BLE into pants with S and AE PRN  Skilled Therapeutic Interventions/Progress Updates:    Treatment session with focus on self-care retraining and safety awareness during mobility.  Pt received in bed asleep, not waking to stimulus.  RN reports that pt did not sleep overnight and was restless this am.  Therapist returned after speaking with MD who reports that he did arouse for his visit.  Upon return, pt aroused to voice.  Pt demonstrating tangential speech throughout session, often going from topic to topic with decreased awareness.  Therapist providing max cues to sustain attention to current task.  Pt agreeable to bathing and dressing, then becoming perceptive on removal of cast to shower.  Engaged in bed mobility with min assist and cues to attend to task.  Pt completed perineal hygiene in supine with setup and cues for thoroughness.  Donned clean incontinence brief with pt rolling with min assist.  Pt able to sit to EOB with CGA and cues for NWB through RLE.  Pt completed UB bathing seated at EOB with frequent cues for attention to task as extremely internally distracted.  Pt able to don shirt with setup assist.  Pt thread BLE through shorts with min cues for technique.  Pt with attempts to complete sit > stand, however when therapist providing cues and support to deter WB through RLE pt agreeable to completing LB dressing at bed level.   Educated on lateral leans, pt unable to complete fully therefore pt returned to supine to pull pants over hips.  SLP arriving to observe pt with meal tray, therefore passed off to SLP.  Pt missed 30 mins due to initial fatigue and SLP arrival to observe meal.  Therapy Documentation Precautions:  Precautions Precaution Comments: trach (with PMV) Required Braces or Orthoses: Splint/Cast Splint/Cast: R LE Restrictions Weight Bearing Restrictions: Yes RLE Weight Bearing: Non weight bearing General: General OT Amount of Missed Time: 30 Minutes Vital Signs: Therapy Vitals Temp: 98.4 F (36.9 C) Temp Source: Oral Pulse Rate: 83 Resp: 17 BP: (!) 148/98 Patient Position (if appropriate): Sitting Oxygen Therapy SpO2: 93 % O2 Device: Tracheostomy Collar Pain:  Pt with no c/o pain   Therapy/Group: Individual Therapy  Rosalio Loud 02/10/2020, 3:06 PM
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
## 2020-02-10 NOTE — Progress Notes (Signed)
Speech Language Pathology Daily Session Note  Patient Details  Name: Roberto Lynch MRN: [id number] Date of Birth: 1955/10/11  Today's Date: 02/10/2020 SLP Individual Time: 1220-1240 SLP Individual Time Calculation (min): 20 min  Short Term Goals: Week 1: SLP Short Term Goal 1 (Week 1): Patient will consume trial PO trays of Dys 3 solids without overt s/s of difficulty or aspiration/penetration SLP Short Term Goal 2 (Week 1): Patient will demonstrate orientation to time, situation, place with minA. SLP Short Term Goal 3 (Week 1): Patient will demonstrate adequate safety awareness during functional tasks with modA SLP Short Term Goal 4 (Week 1): Patient will perform mildly complex problem solving tasks with modA SLP Short Term Goal 5 (Week 1): Patient will increase vocal intensity and clarity at phrase-sentence levels with 80% accuracy and minA  Skilled Therapeutic Interventions:   Patient seen for skilled  ST session focusing on swallow function goals with SLP observing patient with upgraded solids meal tray (Dys 3 from Dys 2). Patient able to self feed with minimal setup assistance and did not exhibit any overt s/s of aspiration or penetration and did not appear with any fatigue. Patient was not noticeably impulsive with oral intake of solids or liquids. He was aware of the change in texture, telling SLP, the other stuff was ground up and this is shredded; its a step between that and regular." Patient continues to benefit from skilled SLP intervention to maximize swallow, speech-language and cognitive function prior to discharge.  Pain Pain Assessment Pain Scale: 0-10 Pain Score: 0-No pain  Therapy/Group: Individual Therapy  Angela Nevin, MA, CCC-SLP Speech Therapy

## 2020-02-10 NOTE — Plan of Care (Signed)
  Problem: RH Balance Goal: LTG Patient will maintain dynamic sitting balance (PT) Description: LTG:  Patient will maintain dynamic sitting balance with assistance during mobility activities (PT) Flowsheets (Taken 02/10/2020 1008) LTG: Pt will maintain dynamic sitting balance during mobility activities with:: Independent with assistive device  Goal: LTG Patient will maintain dynamic standing balance (PT) Description: LTG:  Patient will maintain dynamic standing balance with assistance during mobility activities (PT) Flowsheets (Taken 02/10/2020 1008) LTG: Pt will maintain dynamic standing balance during mobility activities with:: Minimal Assistance - Patient > 75%   Problem: Sit to Stand Goal: LTG:  Patient will perform sit to stand with assistance level (PT) Description: LTG:  Patient will perform sit to stand with assistance level (PT) Flowsheets (Taken 02/10/2020 1008) LTG: PT will perform sit to stand in preparation for functional mobility with assistance level: Minimal Assistance - Patient > 75%   Problem: RH Bed Mobility Goal: LTG Patient will perform bed mobility with assist (PT) Description: LTG: Patient will perform bed mobility with assistance, with/without cues (PT). Flowsheets (Taken 02/10/2020 1008) LTG: Pt will perform bed mobility with assistance level of: Supervision/Verbal cueing   Problem: RH Bed to Chair Transfers Goal: LTG Patient will perform bed/chair transfers w/assist (PT) Description: LTG: Patient will perform bed to chair transfers with assistance (PT). Flowsheets (Taken 02/10/2020 1008) LTG: Pt will perform Bed to Chair Transfers with assistance level: Minimal Assistance - Patient > 75%   Problem: RH Car Transfers Goal: LTG Patient will perform car transfers with assist (PT) Description: LTG: Patient will perform car transfers with assistance (PT). Flowsheets (Taken 02/10/2020 1008) LTG: Pt will perform car transfers with assist:: Minimal Assistance -  Patient > 75%   Problem: RH Furniture Transfers Goal: LTG Patient will perform furniture transfers w/assist (OT/PT) Description: LTG: Patient will perform furniture transfers  with assistance (OT/PT). Flowsheets (Taken 02/10/2020 1008) LTG: Pt will perform furniture transfers with assist:: Minimal Assistance - Patient > 75%   Problem: RH Ambulation Goal: LTG Patient will ambulate in controlled environment (PT) Description: LTG: Patient will ambulate in a controlled environment, # of feet with assistance (PT). Flowsheets (Taken 02/10/2020 1008) LTG: Ambulation distance in controlled environment: TBD   Problem: RH Wheelchair Mobility Goal: LTG Patient will propel w/c in controlled environment (PT) Description: LTG: Patient will propel wheelchair in controlled environment, # of feet with assist (PT) Flowsheets (Taken 02/10/2020 1008) LTG: Pt will propel w/c in controlled environ  assist needed:: Supervision/Verbal cueing LTG: Propel w/c distance in controlled environment: 164ft Goal: LTG Patient will propel w/c in home environment (PT) Description: LTG: Patient will propel wheelchair in home environment, # of feet with assistance (PT). Flowsheets (Taken 02/10/2020 1008) LTG: Pt will propel w/c in home environ  assist needed:: Supervision/Verbal cueing LTG: Propel w/c distance in home environment: 47ft

## 2020-02-10 NOTE — Progress Notes (Signed)
Pt only slept an hour through the night. Pt needed to be reoriented multiple times that it was night time. Pt still did not sleep.
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
## 2020-02-10 NOTE — Progress Notes (Signed)
Orthopedic Tech Progress Note Patient Details:  Roberto Lynch 08/22/1955 [id number] PA LOVE removed stitch from heel, and MANNY held foot while I apply CAST.Marland Kitchen Casting Type of Cast: Short leg cast Cast Location: RLE Cast Material: Fiberglass  Post Interventions Patient Tolerated: Well Instructions Provided: Care of device     [name] 02/10/2020, 3:19 PM

## 2020-02-10 NOTE — Progress Notes (Signed)
Physical Therapy Session Note  Patient Details  Name: Roberto Lynch MRN: [id number] Date of Birth: 07/21/1955  Today's Date: 02/10/2020 PT Individual Time: 1430-1538 PT Individual Time Calculation (min): 68 min   Short Term Goals: Week 1:  PT Short Term Goal 1 (Week 1): Pt will transfer to and from bed with mod assist and maintain NWB 50% of the time PT Short Term Goal 2 (Week 1): Pt will propell WC 100 ft with supervisoin assist PT Short Term Goal 3 (Week 1): Pt will perform sit<>stand in RW with mod assist and maintain NWB 50% of the time  Skilled Therapeutic Interventions/Progress Updates:   Pt received supine in bed and agreeable to PT. PT obtained WC with hard back for improved postural control, sitting tolerance and safety when sitting in WC. Supine>sit transfer with supervision assist from partially elevated surface. Squat pivot transfer to [facility] with max assist and max cues safety, UE placement, NWB and technique. Pt attempted to move WC regardless of cues from PT to proper safe set up. Unable to maintain NWB. WC propulsion x 73f with min assist from PT. [facility]ed sit<>[facility] with PT preventing WB through the RLE, but pt unable to power into standing.  SB [facility] and max cues for NWB in the RLE. Pt continued to demonstrate inability to perform scoot without use of RLE, but able to verbalize NWB precautions.   PT raised WC height from hemi to standard to allow improved safety and independence with transfers to and from bed. [facility] returned to [facility] and [facility] bell in reach, alarm set and all needs met.         Therapy Documentation Precautions:  Precautions Precaution Comments: trach (with PMV) Required Braces or Orthoses: Splint/Cast Splint/Cast: R LE Restrictions Weight Bearing Restrictions: Yes RLE Weight Bearing: Non weight bearing    Vital Signs: Therapy Vitals Temp: 98.4 F (36.9 C) Temp  Source: Oral Pulse Rate: 83 Resp: 17 BP: (!) 148/98 Patient Position (if appropriate): Sitting Oxygen Therapy SpO2: 93 % O2 Device: Tracheostomy Collar Pain: Denies    Therapy/Group: Individual Therapy  ALorie Phenix10/22/2021, 3:53 PM
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
## 2020-02-11 MED ORDER — ZOLPIDEM TARTRATE 5 MG PO TABS
5.0000 mg | ORAL_TABLET | Freq: Every evening | ORAL | Status: DC | PRN
  Administered 2020-02-11: 5 mg via ORAL
  Filled 2020-02-11: qty 1

## 2020-02-11 MED ORDER — LACTULOSE 10 GM/15ML PO SOLN
20.0000 g | Freq: Three times a day (TID) | ORAL | Status: DC
  Administered 2020-02-11 – 2020-02-15 (×13): 20 g via ORAL
  Filled 2020-02-11 (×14): qty 30

## 2020-02-11 NOTE — Progress Notes (Signed)
[place] PHYSICAL MEDICINE & REHABILITATION PROGRESS NOTE  Subjective/Complaints: Patient seen sitting up at the edge of his bed eating breakfast this morning.  He did not sleep well overnight per nursing.  Patient states that he still has his days and nights mixed up.  Encourage patient to stay awake during the day today.  He is more appropriate this morning, having a discussion regarding medications.  No agitation noted overnight.  He was seen by Ortho yesterday, notes reviewed-cast changed.  ROS: Denies CP, SOB, N/V/D  Objective: Vital Signs: Blood pressure (!) 132/93, pulse 72, temperature 97.7 F (36.5 C), resp. rate 18, height 5\' 9"  (1.753 m), weight 116.5 kg, SpO2 93 %. DG Ankle Complete Right  Result Date: 02/09/2020 CLINICAL DATA:  Right talar fracture, ORIF, follow-up examination EXAM: RIGHT ANKLE - COMPLETE 3+ VIEW COMPARISON:  01/27/2020, 12/27/2019 FINDINGS: Three view radiograph of the right ankle performed within an external immobilizer. Talar ORIF has been performed with a lateral plate and multiple screws. Fracture fragments are in anatomic alignment. There is evidence of interval healing with callus formation bridging the talar neck fracture fragments, though the fracture plane does appear partially visualized in keeping with incomplete healing. There is mild in congruence of the posterior facet of the a subtalar joints, unchanged from prior examination. Multiple ossific fragments are seen lateral to the talus and distal fibula, unchanged from multiple prior examinations representing fracture fragments of the original talar fracture. The ankle mortise is intact. IMPRESSION: Talar ORIF with fracture fragments in anatomic alignment with progressive but incomplete healing. Persistent mild articular in congruence involving the posterior facet of the subtalar joint. Not well assessed on this examination. Electronically Signed   By: 02/26/2020 MD   On: 02/09/2020 10:34   Recent  Labs    02/09/20 0945  WBC 9.0  HGB 14.4  HCT 44.7  PLT 304   Recent Labs    02/09/20 0945  NA 138  K 4.0  CL 101  CO2 26  GLUCOSE 126*  BUN 13  CREATININE 0.99  CALCIUM 10.4*    Intake/Output Summary (Last 24 hours) at 02/11/2020 1012 Last data filed at 02/11/2020 0805 Gross per 24 hour  Intake 600 ml  Output 750 ml  Net -150 ml        Physical Exam: BP (!) 132/93 (BP Location: Left Arm)   Pulse 72   Temp 97.7 F (36.5 C)   Resp 18   Ht 5\' 9"  (1.753 m)   Wt 116.5 kg   SpO2 93%   BMI 37.93 kg/m  Constitutional: No distress . Vital signs reviewed.  Morbidly obese. HENT: Normocephalic.  Atraumatic.  Poor dentition. Neck: Trach with PMV and trach collar. Eyes: EOMI. No discharge. Cardiovascular: No JVD.  Irregularly irregular. Respiratory: Normal effort.  No stridor.  Bilateral clear to auscultation. GI: Non-distended.  BS +. Skin: Warm and dry.  Right lower extremity with cast CDI Psych: Normal mood.  Normal behavior. Musc: Right lower extremity edema and tenderness Neuro: Alert and oriented, except for president Motor: 4-4+/5 throughout, stable  Assessment/Plan: 1. Functional deficits secondary to polytrauma which require 3+ hours per day of interdisciplinary therapy in a comprehensive inpatient rehab setting.  Physiatrist is providing close team supervision and 24 hour management of active medical problems listed below.  Physiatrist and rehab team continue to assess barriers to discharge/monitor patient progress toward functional and medical goals   Care Tool:  Bathing    Body parts bathed by patient: Right arm,  Left arm, Chest, Abdomen, Front perineal area, Right upper leg, Left upper leg, Face   Body parts bathed by helper: Buttocks, Left lower leg Body parts n/a: Right lower leg   Bathing assist Assist Level: Moderate Assistance - Patient 50 - 74%     Upper Body Dressing/Undressing Upper body dressing   What is the patient wearing?: Pull  over shirt    Upper body assist Assist Level: Supervision/Verbal cueing    Lower Body Dressing/Undressing Lower body dressing      What is the patient wearing?: Underwear/pull up, Pants     Lower body assist Assist for lower body dressing: Minimal Assistance - Patient > 75%     Toileting Toileting    Toileting assist Assist for toileting: Maximal Assistance - Patient 25 - 49%     Transfers Chair/bed transfer  Transfers assist  Chair/bed transfer activity did not occur: Safety/medical concerns        Locomotion Ambulation   Ambulation assist   Ambulation activity did not occur: Safety/medical concerns          Walk 10 feet activity   Assist  Walk 10 feet activity did not occur: Safety/medical concerns        Walk 50 feet activity   Assist Walk 50 feet with 2 turns activity did not occur: Safety/medical concerns         Walk 150 feet activity   Assist Walk 150 feet activity did not occur: Safety/medical concerns         Walk 10 feet on uneven surface  activity   Assist Walk 10 feet on uneven surfaces activity did not occur: Safety/medical concerns         Wheelchair     Assist Will patient use wheelchair at discharge?: Yes Type of Wheelchair: Manual    Wheelchair assist level: Minimal Assistance - Patient > 75% Max wheelchair distance: 100    Wheelchair 50 feet with 2 turns activity    Assist        Assist Level: Minimal Assistance - Patient > 75%   Wheelchair 150 feet activity     Assist      Assist Level: Moderate Assistance - Patient 50 - 74%    Medical Problem List and Plan: 1. 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  Continue CIR  2.  Antithrombotics: -DVT/anticoagulation:  Pharmaceutical: Other (comment) Eliquis             -antiplatelet therapy: NA 3. Pain Management: Oxycodone prn   Controlled  with meds on 10/23  Monitor with increased exertion 4. Mood: Team support  Klonopin 1 mg twice daily             -antipsychotic agents: Seroquel 200 twice daily, changed to daily on 10/23 5. Neuropsych: This patient is not capable of making decisions on his own behalf.  Telesitter for safety 6. Skin/Wound Care: Routine skin care and pressure relief measures 7. Fluids/Electrolytes/Nutrition: Routine I/Os. 8. 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 Vida Roller be discharged to home with trach and evaluated by outpatient Pulm for assessment of BIPAP needs prior to decannulation.   9. COPD: Continue Brovana/budesonide/hypertonic nebs bid,  and robitussin every 6 hours.   10. Hepatic encephalopathy/ETOH abuse: No longer requiring prn Haldol.   Hyperammonemia-lactulose started, increased on 10/22, increased again on 10/23 10. A fib w/RVR:   Amiodarone  Eliquis  Rate controlled on 10/23  Monitor with increased activity 11. Right ankle Fx:   X-ray showing healing, cast changed per Ortho 12.  Metabolic bone disease: Vitamin D supplement. 13.  Dysphagia  D3 thins, advance diet as tolerated 14.  Sundowning  See #4, #10  Continue melatonin  Ambien 2.5 ordered on 10/22, increased on 10/23  LOS: 3 days A FACE TO FACE EVALUATION WAS PERFORMED  Cecile Guevara Karis Juba 02/11/2020, 10:12 AM

## 2020-02-11 NOTE — Progress Notes (Signed)
Between the 1400 and 1500 hour patient was wheeled of unit down stairs by patient's daughter's boyfriend without saying anything to staff. Patient's son was with them in a w/c. Patient brought back pictures of his excursion on his smart phone. His daughter was pictured giving patient cigarettes to smoke. MD was notified and Dr. Allena Katz gave orders to put those responsible for taking patient off unit and the patient's daughter on the no visit list. [facility] notified and names were given for the no visit list.

## 2020-02-12 ENCOUNTER — Inpatient Hospital Stay (HOSPITAL_COMMUNITY): Payer: 59 | Admitting: Occupational Therapy

## 2020-02-12 ENCOUNTER — Inpatient Hospital Stay (HOSPITAL_COMMUNITY): Payer: 59 | Admitting: Speech Pathology

## 2020-02-12 ENCOUNTER — Inpatient Hospital Stay (HOSPITAL_COMMUNITY): Payer: 59 | Admitting: Physical Therapy

## 2020-02-12 DIAGNOSIS — Z720 Tobacco use: Secondary | ICD-10-CM

## 2020-02-12 MED ORDER — QUETIAPINE FUMARATE 200 MG PO TABS
200.0000 mg | ORAL_TABLET | Freq: Two times a day (BID) | ORAL | Status: DC
  Administered 2020-02-13 – 2020-02-15 (×5): 200 mg via ORAL
  Filled 2020-02-12 (×5): qty 1

## 2020-02-12 MED ORDER — LORAZEPAM 2 MG/ML IJ SOLN
0.5000 mg | Freq: Three times a day (TID) | INTRAMUSCULAR | Status: DC | PRN
  Filled 2020-02-12: qty 1

## 2020-02-12 NOTE — Progress Notes (Signed)
Patient threw phone at nurse tech because he was made that no one would come and pick him up to take him home. MD gave order to change route of Ativan. Nurse tech talked with patient and patient calmed down and said he would like to go to sleep and asked for his melatonin. DR. Allena Katz stated we could give the Seroquel now with the melatonin and hold off on giving the Ativan for now. Meds were given as such. Patient much calmer at this point.

## 2020-02-12 NOTE — Progress Notes (Signed)
[place] PHYSICAL MEDICINE & REHABILITATION PROGRESS NOTE  Subjective/Complaints: Patient seen sitting up in bed this morning.  He states he slept well overnight.  Confirmed with sleep chart.  Yesterday, patient was taken off of the unit by patient's daughters boyfriend (told grandson by nursing) without informing staff, with pictures of him smoking outside.  Today patient states that he is having his son-in-law come to take patient home.  Attempted to educate patient.  ROS: Denies CP, SOB, N/V/D  Objective: Vital Signs: Blood pressure 123/77, pulse 84, temperature 98.4 F (36.9 C), resp. rate 20, height 5\' 9"  (1.753 m), weight 115.9 kg, SpO2 91 %. No results found. Recent Labs    02/09/20 0945  WBC 9.0  HGB 14.4  HCT 44.7  PLT 304   Recent Labs    02/09/20 0945  NA 138  K 4.0  CL 101  CO2 26  GLUCOSE 126*  BUN 13  CREATININE 0.99  CALCIUM 10.4*    Intake/Output Summary (Last 24 hours) at 02/12/2020 0915 Last data filed at 02/12/2020 0859 Gross per 24 hour  Intake 840 ml  Output 100 ml  Net 740 ml        Physical Exam: BP 123/77 (BP Location: Left Arm)   Pulse 84   Temp 98.4 F (36.9 C)   Resp 20   Ht 5\' 9"  (1.753 m)   Wt 115.9 kg   SpO2 91%   BMI 37.73 kg/m  Constitutional: No distress . Vital signs reviewed.  Morbidly obese. HENT: Normocephalic.  Atraumatic.  Poor dentition. Neck: Trach with PMV and trach collar. Eyes: EOMI. No discharge. Cardiovascular: No JVD.  Irregularly irregular. Respiratory: Normal effort.  No stridor.  Bilateral clear to auscultation. GI: Non-distended.  BS +. Skin: Warm and dry.  Right lower extremity cast CDI Psych: Normal mood.  Normal behavior. Musc: Cast to right lower extremity, otherwise no edema or tenderness. Neuro: Alert and oriented x2 Motor: 4-4+/5 throughout, unchanged  Assessment/Plan: 1. Functional deficits secondary to polytrauma which require 3+ hours per day of interdisciplinary therapy in a  comprehensive inpatient rehab setting.  Physiatrist is providing close team supervision and 24 hour management of active medical problems listed below.  Physiatrist and rehab team continue to assess barriers to discharge/monitor patient progress toward functional and medical goals   Care Tool:  Bathing    Body parts bathed by patient: Right arm, Left arm, Chest, Abdomen, Front perineal area, Right upper leg, Left upper leg, Face   Body parts bathed by helper: Buttocks, Left lower leg Body parts n/a: Right lower leg   Bathing assist Assist Level: Moderate Assistance - Patient 50 - 74%     Upper Body Dressing/Undressing Upper body dressing   What is the patient wearing?: Pull over shirt    Upper body assist Assist Level: Supervision/Verbal cueing    Lower Body Dressing/Undressing Lower body dressing      What is the patient wearing?: Underwear/pull up, Pants     Lower body assist Assist for lower body dressing: Minimal Assistance - Patient > 75%     Toileting Toileting    Toileting assist Assist for toileting: Moderate Assistance - Patient 50 - 74%     Transfers Chair/bed transfer  Transfers assist  Chair/bed transfer activity did not occur: Safety/medical concerns        Locomotion Ambulation   Ambulation assist   Ambulation activity did not occur: Safety/medical concerns          Walk 10 feet activity  Assist  Walk 10 feet activity did not occur: Safety/medical concerns        Walk 50 feet activity   Assist Walk 50 feet with 2 turns activity did not occur: Safety/medical concerns         Walk 150 feet activity   Assist Walk 150 feet activity did not occur: Safety/medical concerns         Walk 10 feet on uneven surface  activity   Assist Walk 10 feet on uneven surfaces activity did not occur: Safety/medical concerns         Wheelchair     Assist Will patient use wheelchair at discharge?: Yes Type of Wheelchair:  Manual    Wheelchair assist level: Minimal Assistance - Patient > 75% Max wheelchair distance: 100    Wheelchair 50 feet with 2 turns activity    Assist        Assist Level: Minimal Assistance - Patient > 75%   Wheelchair 150 feet activity     Assist      Assist Level: Moderate Assistance - Patient 50 - 74%    Medical Problem List and Plan: 1. 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  Continue CIR  2.  Antithrombotics: -DVT/anticoagulation:  Pharmaceutical: Other (comment) Eliquis             -antiplatelet therapy: NA 3. Pain Management: Oxycodone prn   Controlled with meds on 10/24  Monitor with increased exertion 4. Mood: Team support  Klonopin 1 mg twice daily             -antipsychotic agents: Seroquel 200 twice daily, changed to daily on 10/23 5. Neuropsych: This patient is not capable of making decisions on his own behalf.  Telesitter for safety 6. Skin/Wound Care: Routine skin care and pressure relief measures 7. Fluids/Electrolytes/Nutrition: Routine I/Os. 8. 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 Vida Roller be discharged to home with trach and evaluated by outpatient Pulm for assessment of BIPAP needs prior to decannulation.   9. COPD: Continue Brovana/budesonide/hypertonic nebs bid,  and robitussin every 6 hours.   10. Hepatic encephalopathy/ETOH abuse: No longer requiring prn Haldol.   Hyperammonemia-lactulose started, increased on 10/22, increased again on 10/23   Will consider further increase if necessary 10. A fib w/RVR:   Amiodarone             Eliquis  Rate controlled on 10/24  Monitor with increased activity 11. Right ankle Fx:   X-ray showing healing, cast changed per Ortho 12.  Metabolic bone disease: Vitamin D supplement. 13.  Dysphagia  D3 thins, advance diet as tolerated 14.  Sundowning  See #4, #10   Continue melatonin  Ambien 2.5 ordered on 10/22, increased on 10/23  Improving 15.  Tobacco abuse  Educated patient on risks as well as policies  LOS: 4 days A FACE TO FACE EVALUATION WAS PERFORMED  Issaac Shipper Karis Juba 02/12/2020, 9:15 AM

## 2020-02-12 NOTE — Progress Notes (Signed)
Occupational Therapy Session Note  Patient Details  Name: Roberto Lynch MRN: [id number] Date of Birth: [birth year]  Today's Date: 02/12/2020 OT Individual Time: 8295-6213 and 1255-1336 OT Individual Time Calculation (min): 57 min and 41 min  Short Term Goals: Week 1:  OT Short Term Goal 1 (Week 1): Pt will transfer with LRAD and 1 person to decrease BOC OT Short Term Goal 2 (Week 1): PT will maintain NWB precautions with MOD VC during ADLs/mobility OT Short Term Goal 3 (Week 1): Pt will thread BLE into pants with S and AE PRN  Skilled Therapeutic Interventions/Progress Updates:    Pt greeted EOB after eating breakfast. He was adamant that he was going home today and deciding whether he was going to take a bath in a tub or Jacuzzi. Pt also stating that he had a pair of steel toed boots at home (drew a picture for OT) that he wanted to wear instead of his Rt leg cast. OT advised him to relay d/c plans to MD. Per pt, he drew this picture for MD this morning. Pt verbose with tangential speech throughout tx, exhibiting decreased safety awareness and consideration of his Rt LE weightbearing precautions, needing cues and manual assistance for adherence. Supervision for UB bathing/dressing for mgt of trach collar. Assistance for washing his Lt foot. Though he stated that standing on his Lt leg alone at this time was "impossible," he was adamant that he wanted to complete LB self care at sit<stand level. With encouragement and safety education, pt amenable to complete LB tasks bedlevel. He was able to lower shorts and brief to complete pericare in the front and back. Min A to fully elevate clothing over hips after via rolling; note that he was able to do most of this himself via bridging as long as OT held the Rt LE in an extended position for NWB. Oral care and handwashing completed while sitting EOB afterwards, supervision for supine<sit from flat bed while using the bedrail. Pt remained EOB with RT for  respiratory care. Left him with call bell within reach and bed alarm set.     2nd Session 1:1 tx (41 min) Pt greeted EOB after eating lunch, trach tubing disconnected from plastic collar attachment. OT reconnected tubing and educated pt to not disconnect it for safety. Questionable carryover of education due to cognitive deficits. Per pt, his spouse is arriving around 5pm to take him home. Tried to work on anticipatory awareness by discussing trach care, basic transfers while maintaining NWB Rt LE, and even car transfer for leaving the hospital. Pt adamant that he will just stand for transfers and install bars around home so he can easily pull himself up into standing. Pt reports his wife works in housekeeping and that she "sees it a lot"- pertaining to trach care. Pt continues to refuse safety education in regards to staying at [facility] until he is at a manageable functional level for scheduled d/c home with wife. Transitioned to AE training using the sock aide and reacher. Pt able to doff gripper socks using reacher and then don both gripper socks using the extra wide sock aide (grossly donning sock over his Rt LE cast). Note that pt initiated untying the knots on sock aide and then tying new knots. ?Unsure if this actually made a difference in ability to use device. At end of session pt declined returning to supine, left him with all needs within reach and bed alarm set.   Therapy Documentation Precautions:  Precautions Precaution  Comments: trach (with PMV) Required Braces or Orthoses: Splint/Cast Splint/Cast: R LE Restrictions Weight Bearing Restrictions: Yes RLE Weight Bearing: Non weight bearing Vital Signs: Therapy Vitals Pulse Rate: 82 Resp: 20 Patient Position (if appropriate): Lying Oxygen Therapy SpO2: 94 % O2 Device: Tracheostomy Collar FiO2 (%): 21 % Pain: pt denied pain during session  Pain Assessment Pain Scale: 0-10 Pain Score: 0-No pain ADL: ADL Eating: Set up Where  Assessed-Eating: Bed level Grooming: Supervision/safety Where Assessed-Grooming: Edge of bed Upper Body Bathing: Supervision/safety Where Assessed-Upper Body Bathing: Other (Comment) (BSC) Lower Body Bathing: Moderate assistance Where Assessed-Lower Body Bathing: Other (Comment) (BSC) Upper Body Dressing: Setup (BSC) Lower Body Dressing: Moderate assistance Where Assessed-Lower Body Dressing: Bed level, Edge of bed Toileting: Dependent Toilet Transfer: Dependent (+2 to maintain NWB precautions) Toilet Transfer Method: Neurosurgeon Equipment: Drop arm bedside commode, Extra wide drop arm bedside commode      Therapy/Group: Individual Therapy  Norwin Aleman A Adithi Gammon 02/12/2020, 12:30 PM

## 2020-02-12 NOTE — Progress Notes (Signed)
Speech Language Pathology Daily Session Note  Patient Details  Name: Roberto Lynch MRN: [id number] Date of Birth: [birth year]  Today's Date: 02/12/2020 SLP Individual Time: 1000-1041 SLP Individual Time Calculation (min): 41 min  Short Term Goals: Week 1: SLP Short Term Goal 1 (Week 1): Patient will consume trial PO trays of Dys 3 solids without overt s/s of difficulty or aspiration/penetration SLP Short Term Goal 2 (Week 1): Patient will demonstrate orientation to time, situation, place with minA. SLP Short Term Goal 3 (Week 1): Patient will demonstrate adequate safety awareness during functional tasks with modA SLP Short Term Goal 4 (Week 1): Patient will perform mildly complex problem solving tasks with modA SLP Short Term Goal 5 (Week 1): Patient will increase vocal intensity and clarity at phrase-sentence levels with 80% accuracy and minA  Skilled Therapeutic Interventions: Pt was seen for skilled ST targeting cognitive-linguistic goals. Upon arrival, pt stated that he was making plans to discharge home today. During this conversation, overall Max A provided for reasoning and awareness of current medical, physical, and cognitive impairments that would prohibit a safe d/c home today. Pt remained adamant throughout session despite SLP's attempts at education and rationale behind CIR, although he wold agree that his therapies are helping him progress. Pt also requested more information about laryngeal anatomy and trach/speaking valve, therefore verbal explanations with use of [employer] and a visual handout provided. He also required Mod a multimodal cues to problem solve use of channel vs volume buttons to accurately work his TV remote and Mod A cues for redirection to functional tasks and topics of conversation throughout session. Pt left sitting edge of bed with alarm set and needs within reach. Continue per current plan of care.          Pain Pain Assessment Pain Scale:  0-10 Pain Score: 0-No pain  Therapy/Group: Individual Therapy  Little Ishikawa 02/12/2020, 11:27 AM

## 2020-02-12 NOTE — Progress Notes (Signed)
Physical Therapy Session Note  Patient Details  Name: Roberto Lynch MRN: [id number] Date of Birth: [birth year]  Today's Date: 02/12/2020 PT Individual Time: 5093-2671 PT Individual Time Calculation (min): 68 min   Short Term Goals: Week 1:  PT Short Term Goal 1 (Week 1): Pt will transfer to and from bed with mod assist and maintain NWB 50% of the time PT Short Term Goal 2 (Week 1): Pt will propell WC 100 ft with supervisoin assist PT Short Term Goal 3 (Week 1): Pt will perform sit<>stand in RW with mod assist and maintain NWB 50% of the time  Skilled Therapeutic Interventions/Progress Updates:    Pt received seated on BSC in room with nursing present, this therapist takes over for nursing. Pt performs sit to stand to RW but does not adhere to [facility] precautions during transfer. 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 However, frequently throughout session pt requesting to use crutches as he believes he could adhere to [facility] precautions with use of crutches despite not being able to with RW. Education with patient about safety and that he would not be safe to utilize crutches at this time. Pt is dependent for pericare in standing then transferred to bed. Pt able to don brief with max A via lateral leans L/R as well as pull pants up over hips in similar manner. Pt keeps stating "why can't I just stand up, that would make this easier". Reminded pt of WBing precautions and his inability to maintain precautions with standing. Slide board transfer to w/c with assist x 2 for safety. Pt requires max cueing to adhere to [facility] precautions during SB transfer. Pt requesting to weigh himself so taken to dayroom. [name] weight while seated in w/c 310.10 lbs. Slide board transfer w/c to/from mat table with min A with pt demonstrating better understanding of how to perform transfer but still requiring max cueing for NWBing  adherence as well as head/hips relationship. [name] wheelchair then weighed at 51.6 lbs. Pt informed he weighs 258.5 lbs. Pt requests w/c gloves prior to performance of w/c mobility, provided pt with size XL gloves. Manual w/c propulsion x 100 ft with use of BUE at Supervision level. Pt frequently on the phone during session calling various family members to request they come pick him up from the hospital to take him home. Pt not receptive to education regarding ELOS, current functional deficits, safety, equipment he will need to d/c home safely, etc. This therapist spoke with pt's brother Roberto Lynch on the phone during session and he reports the family is aware of pt's ELOS and does not intend to come pick the patient up despite pt's requests. Pt exhibits very poor insight and safety awareness throughout session. Pt left seated in w/c in room with needs in reach, quick release belt and chair alarm in place, telesitter in place, nursing aware of pt's location.  Therapy Documentation Precautions:  Precautions Precaution Comments: trach (with PMV) Required Braces or Orthoses: Splint/Cast Splint/Cast: R LE Restrictions Weight Bearing Restrictions: Yes RLE Weight Bearing: Non weight bearing   Therapy/Group: Individual Therapy   Peter Congo, PT, DPT  02/12/2020, 5:07 PM

## 2020-02-12 NOTE — Progress Notes (Signed)
Observed patient up in bedside chair on phone upon initial rounding, verbalize his fluctuations and agitation with "wanting to leave this damn place and go home, because he has some personal business to take care of. Allow patient to ventilate his concerns, but informed him he can not leave tonight and need to discuss these concerns with his MD in the morning,he spoke with his family member and eventually took his medications. No acute distress or discomfort noted.

## 2020-02-13 ENCOUNTER — Inpatient Hospital Stay (HOSPITAL_COMMUNITY): Payer: 59

## 2020-02-13 ENCOUNTER — Inpatient Hospital Stay (HOSPITAL_COMMUNITY): Payer: 59 | Admitting: Occupational Therapy

## 2020-02-13 LAB — CBC
HCT: 43.2 % (ref 39.0–52.0)
Hemoglobin: 13.8 g/dL (ref 13.0–17.0)
MCH: 31.5 pg (ref 26.0–34.0)
MCHC: 31.9 g/dL (ref 30.0–36.0)
MCV: 98.6 fL (ref 80.0–100.0)
Platelets: 294 10*3/uL (ref 150–400)
RBC: 4.38 MIL/uL (ref 4.22–5.81)
RDW: 13.1 % (ref 11.5–15.5)
WBC: 7.4 10*3/uL (ref 4.0–10.5)
nRBC: 0 % (ref 0.0–0.2)

## 2020-02-13 LAB — BASIC METABOLIC PANEL
Anion gap: 10 (ref 5–15)
BUN: 14 mg/dL (ref 8–23)
CO2: 25 mmol/L (ref 22–32)
Calcium: 10 mg/dL (ref 8.9–10.3)
Chloride: 103 mmol/L (ref 98–111)
Creatinine, Ser: 0.82 mg/dL (ref 0.61–1.24)
GFR, Estimated: >60 mL/min (ref >60)
Glucose, Bld: 109 mg/dL — ABNORMAL HIGH (ref 70–99)
Potassium: 3.7 mmol/L (ref 3.5–5.1)
Sodium: 138 mmol/L (ref 135–145)

## 2020-02-13 MED ORDER — CLONAZEPAM 0.25 MG PO TBDP
1.0000 mg | ORAL_TABLET | Freq: Three times a day (TID) | ORAL | Status: DC | PRN
  Administered 2020-02-13 – 2020-02-14 (×2): 1 mg via ORAL
  Filled 2020-02-13 (×2): qty 4

## 2020-02-13 MED ORDER — LORAZEPAM 0.5 MG PO TABS
0.5000 mg | ORAL_TABLET | Freq: Once | ORAL | Status: AC | PRN
  Administered 2020-02-13: 0.5 mg via ORAL
  Filled 2020-02-13: qty 1

## 2020-02-13 NOTE — Progress Notes (Signed)
Physical Therapy Session Note  Patient Details  Name: Roberto Lynch MRN: [id number] Date of Birth: [birth year]  Today's Date: 02/13/2020 PT Individual Time: 5638-7564 PT Individual Time Calculation (min): 69 min   Short Term Goals: Week 1:  PT Short Term Goal 1 (Week 1): Pt will transfer to and from bed with mod assist and maintain NWB 50% of the time PT Short Term Goal 2 (Week 1): Pt will propell WC 100 ft with supervisoin assist PT Short Term Goal 3 (Week 1): Pt will perform sit<>stand in RW with mod assist and maintain NWB 50% of the time  Skilled Therapeutic Interventions/Progress Updates:   Received pt supine in bed flipped around with head at footboard, pt agreeable to therapy, and denied any pain during session. Pt extremely verbose and frequently using profanity throughout session. Session with emphasis on functional mobility/transfers, generalized strengthening, dynamic standing balance/coordination, and improved activity tolerance. Pt performed bed mobility with supervision and required increased time with all mobility as pt is hypertalkative constantly expressing frustration with MD regarding NWB precautions. Therapist attempted to educate pt on healing timeline and recovery process with emphasis on importance of maintaining precautions, however pt completely unreceptive. Pt transferred bed<>WC stand<>pivot with RW and mod A with pt not adhering to R LE NWB precautions despite cues. Pt donned WC gloves with increased time and performed WC mobility 155ft using bilateral UEs and supervision with increased time and cues to avoid running into obstacles on L side. Pt asking therapist multiple times for massages and required max cues for re-direction and to focus on therapy. Pt transferred WC<>mat via slideboard with min A with cues to avoid WB on R LE however pt not receptive to education and putting full weight on R LE. Pt required cues for scooting technique and hand placement but pt ultimately  stated "I'm doing what I want". Pt transferred sit<>stand with RW and mod A from elevated mat x 3 trials but unable to maintain R LE NWB precautions. While standing pt able to maintain R LE NWB status ~10 seconds x 1 and ~25 seconds x 2 with TDWB status prior to sitting. Pt with increased frustration with therapist's cues for WB status calling this therapist a "wicked witch". Mat<>WC via slideboard with min A. Pt transported back to room in [facility] total A and transferred WC<>bed via slideboard with min A and sit<>supine with supervision. Concluded session with pt supine in bed, needs within reach, and bed alarm on. Pt requesting to speak with MD, RN made aware.   Therapy Documentation Precautions:  Precautions Precaution Comments: trach (with PMV) Required Braces or Orthoses: Splint/Cast Splint/Cast: R LE Restrictions Weight Bearing Restrictions: Yes RLE Weight Bearing: Non weight bearing  Therapy/Group: Individual Therapy [name] PT, DPT   02/13/2020, 7:23 AM

## 2020-02-13 NOTE — Progress Notes (Signed)
Patient asleep at intervals throughout shift, appears in no acute distress or discomfort, Lung sounds diminished throughout,Trach care provided and oral suction provided .Sleep chart continue slept approximately 7 /12 hours this. Monitor and assisted closely, Tele sitter in place with monitoring, denies pain.Continue medical regime.

## 2020-02-13 NOTE — Progress Notes (Signed)
Occupational Therapy Session Note  Patient Details  Name: Roberto Lynch MRN: [id number] Date of Birth: [birth year]  Today's Date: 02/13/2020 OT Individual Time: 8768-1157 OT Individual Time Calculation (min): 58 min   Short Term Goals: Week 1:  OT Short Term Goal 1 (Week 1): Pt will transfer with LRAD and 1 person to decrease BOC OT Short Term Goal 2 (Week 1): PT will maintain NWB precautions with MOD VC during ADLs/mobility OT Short Term Goal 3 (Week 1): Pt will thread BLE into pants with S and AE PRN  Skilled Therapeutic Interventions/Progress Updates:    Pt greeted EOB with no c/o pain. Adamant that he is d/c home today, stating he's going to get an [employer] driver because family won't take him home. Reiterated to pt the benefits of staying at [facility] for increased safety and functional independence, pt refusing education. He declined bathing/dressing, opting to start session by completing oral care. He did so with setup assistance. Pt reporting that he's going to crawl up his stairs at home and walk to his toilet. Educated pt that these methods are not safe at this time given his WB precautions, discussed that he can transfer to the toilet using the slideboard. Pt adamant that he will stand to transfer at home but agreeable to having the board at home to use as needed. Slideboard<w/c completed with Min A to place board and vcs for NWB Rt LE. Note that pt stood up during transfer after he was cued to sit for the entire transfer. Pt did complete slideboard<drop arm BSC over toilet, laterally scooting for entirety of transfer but still visibly bearing weight in the Rt LE. 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 Educated pt on using lateral lean technique, OT demonstrated lowering arm of commode to increase ease of toileting tasks. Pt refusing education, adamant that he will just stand  at home to complete these tasks. Slideboard<w/c<EOB completed with assistance to place slideboard only. Pt periodically standing up during transferring despite cues to sit and scoot laterally for entire transfer. 02 sats while EOB wearing PMSV (without trach collar) 98%, no s/s SOB during tx without trach collar. Trach collar was then reapplied. Left pt EOB with all needs within reach and bed alarm set, adamant that he will talk to the hospital administrator today to allow him to go home, still refusing OT education to stay in CIR for therapeutic and safety reasons.    Therapy Documentation Precautions:  Precautions Precaution Comments: trach (with PMV) Required Braces or Orthoses: Splint/Cast Splint/Cast: R LE Restrictions Weight Bearing Restrictions: Yes RLE Weight Bearing: Non weight bearing ADL: ADL Eating: Set up Where Assessed-Eating: Bed level Grooming: Supervision/safety Where Assessed-Grooming: Edge of bed Upper Body Bathing: Supervision/safety Where Assessed-Upper Body Bathing: Other (Comment) (BSC) Lower Body Bathing: Moderate assistance Where Assessed-Lower Body Bathing: Other (Comment) (BSC) Upper Body Dressing: Setup (BSC) Lower Body Dressing: Moderate assistance Where Assessed-Lower Body Dressing: Bed level, Edge of bed Toileting: Dependent Toilet Transfer: Dependent (+2 to maintain NWB precautions) Toilet Transfer Method: Neurosurgeon Equipment: Drop arm bedside commode, Extra wide drop arm bedside commode      Therapy/Group: Individual Therapy  Jmari Pelc A Shanaia Sievers 02/13/2020, 12:17 PM

## 2020-02-13 NOTE — Progress Notes (Addendum)
[place] PHYSICAL MEDICINE & REHABILITATION PROGRESS NOTE  Subjective/Complaints: Roberto Lynch states he would like to go home- he is frustrated regarding weightbearing status of right leg. Discussed with SLP who states he has said this before and does not have capacity to make his own decisions.  Systolic BP slightly elevated.   ROS: Denies CP, SOB, N/V/D  Objective: Vital Signs: Blood pressure (!) 144/87, pulse 90, temperature 98 F (36.7 C), resp. rate 18, height 5\' 9"  (1.753 m), weight 115.9 kg, SpO2 96 %. No results found. Recent Labs    02/13/20 0734  WBC 7.4  HGB 13.8  HCT 43.2  PLT 294   Recent Labs    02/13/20 0734  NA 138  K 3.7  CL 103  CO2 25  GLUCOSE 109*  BUN 14  CREATININE 0.82  CALCIUM 10.0    Intake/Output Summary (Last 24 hours) at 02/13/2020 1320 Last data filed at 02/13/2020 0900 Gross per 24 hour  Intake 940 ml  Output 600 ml  Net 340 ml        Physical Exam: BP (!) 144/87 (BP Location: Left Arm)   Pulse 90   Temp 98 F (36.7 C)   Resp 18   Ht 5\' 9"  (1.753 m)   Wt 115.9 kg   SpO2 96%   BMI 37.73 kg/m   General: Alert, No apparent distress HEENT: Head is normocephalic, Poor dentition. Neck: Trach with PMV and trach collar. Heart: Irregularly irregular.. No murmurs rubs or gallops Chest: CTA bilaterally without wheezes, rales, or rhonchi; no distress Abdomen: Soft, non-tender, non-distended, bowel sounds positive. Extremities: No clubbing, cyanosis, or edema. Pulses are 2+ Skin: Right lower extremity cast CDI Neuro: Alert and oriented x2 Musculoskeletal: Cast to right lower extremity, otherwise no edema or tenderness. Psych: [name] affect is appropriate. Pt is cooperative  Assessment/Plan: 1. Functional deficits secondary to polytrauma which require 3+ hours per day of interdisciplinary therapy in a comprehensive inpatient rehab setting.  Physiatrist is providing close team supervision and 24 hour management of active medical  problems listed below.  Physiatrist and rehab team continue to assess barriers to discharge/monitor patient progress toward functional and medical goals   Care Tool:  Bathing    Body parts bathed by patient: Right arm, Left arm, Chest, Abdomen, Front perineal area, Right upper leg, Left upper leg, Face, Buttocks   Body parts bathed by helper: Left lower leg Body parts n/a: Right lower leg   Bathing assist Assist Level: Minimal Assistance - Patient > 75%     Upper Body Dressing/Undressing Upper body dressing   What is the patient wearing?: Pull over shirt    Upper body assist Assist Level: Supervision/Verbal cueing    Lower Body Dressing/Undressing Lower body dressing      What is the patient wearing?: Underwear/pull up, Pants     Lower body assist Assist for lower body dressing: Minimal Assistance - Patient > 75%     Toileting Toileting    Toileting assist Assist for toileting: Moderate Assistance - Patient 50 - 74%     Transfers Chair/bed transfer  Transfers assist  Chair/bed transfer activity did not occur: Safety/medical concerns  Chair/bed transfer assist level: Minimal Assistance - Patient > 75% (slideboard)     Locomotion Ambulation   Ambulation assist   Ambulation activity did not occur: Safety/medical concerns          Walk 10 feet activity   Assist  Walk 10 feet activity did not occur: Safety/medical concerns  Walk 50 feet activity   Assist Walk 50 feet with 2 turns activity did not occur: Safety/medical concerns         Walk 150 feet activity   Assist Walk 150 feet activity did not occur: Safety/medical concerns         Walk 10 feet on uneven surface  activity   Assist Walk 10 feet on uneven surfaces activity did not occur: Safety/medical concerns         Wheelchair     Assist Will patient use wheelchair at discharge?: Yes Type of Wheelchair: Manual    Wheelchair assist level: Supervision/Verbal  cueing Max wheelchair distance: 110ft    Wheelchair 50 feet with 2 turns activity    Assist        Assist Level: Supervision/Verbal cueing   Wheelchair 150 feet activity     Assist      Assist Level: Supervision/Verbal cueing    Medical Problem List and Plan: 1. 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  Continue CIR  2.  Antithrombotics: -DVT/anticoagulation:  Pharmaceutical: Other (comment) Eliquis             -antiplatelet therapy: NA 3. Pain Management: Oxycodone prn   Controlled with meds 10/25  Monitor with increased exertion 4. Mood: Team support  Klonopin 1 mg twice daily- increased to TID PRN for agitation. One time Ativan 0.5 was also administered 10/25 for patient outburst- trying to d/c AMA without capacity              -antipsychotic agents: Seroquel 200 twice daily, changed to daily on 10/23 5. Neuropsych: This patient is not capable of making decisions on his own behalf. Discussed with SW neuropsych consult.   Telesitter for safety 6. Skin/Wound Care: Routine skin care and pressure relief measures 7. Fluids/Electrolytes/Nutrition: Routine I/Os. 8. 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 Vida Roller be discharged to home with trach and evaluated by outpatient Pulm for assessment of BIPAP needs prior to decannulation.   9. COPD: Continue Brovana/budesonide/hypertonic nebs bid,  and robitussin every 6 hours.   10. Hepatic encephalopathy/ETOH abuse: No longer requiring prn Haldol.   Hyperammonemia-lactulose started, increased on 10/22, increased again on 10/23   Will consider further increase if necessary 10. A fib w/RVR:   Amiodarone             Eliquis  Rate controlled 10/25  Monitor with increased activity 11. Right ankle Fx:   X-ray showing healing, cast changed per Ortho 12.  Metabolic bone disease: Vitamin D  supplement. 13.  Dysphagia  D3 thins, advance diet as tolerated 14.  Sundowning  See #4, #10  Continue melatonin  Ambien 2.5 ordered on 10/22, increased on 10/23  Improving 15.  Tobacco abuse  Educated patient on risks as well as policies  LOS: 5 days A FACE TO FACE EVALUATION WAS PERFORMED  Roberto Lynch 02/13/2020, 1:20 PM

## 2020-02-13 NOTE — Progress Notes (Signed)
Spoke with Dr. Carlis Abbott regarding patient becoming agitated and needing to increase medication.  Patient is currently taking Ativan 0.5mg  IM and Clonazepam 1mg  twice a day.  New orders given for Ativan 0.5mg  PO one dose.  Clonazepam 1mg  3 times per day and PRN.  Voiced orders back and confirmed with Dr. .

## 2020-02-13 NOTE — Plan of Care (Signed)
Behavioral Plan   Behavior to decrease/ eliminate: Outburst; request for discharge AMA   Changes to environment:  *Allow patient to lie in bed upside down; (feet at the head of the bed)  *Bed alarm at the low sensitivity level; (Out of bed)   Interventions:  Recommendations for interactions with patient: *Allow patient to write instructions down on paper or in memory book  *Don't interrupt; allow him to speak and then redirect  *Don't rush; allow patient time to process and complete requests  *Avoid talking down to him or telling him what to do; overt direction.  *Set up transfers and remind him not to stand;set up for error prevention. Talk him through what you are asking him to do;step by step instructions  *Lateral leans only for toileting and hygiene; if he tries, redirect on appropriate method to use  *Show him physically how he may be incorrect and let him see rather than be told he is incorrect  *Administer prn anxiety medications around the clock    Attendees: Raechel Chute, PT, Renda Rolls, OT, Lupita Dawn, RN and [place], [place], Chana Bode, RN, and Eliezer Bottom, RN

## 2020-02-13 NOTE — Progress Notes (Signed)
AM shift observation.  Patient was impulsive and did walk to the bathroom, after being told to not get up.  Patient wanting to speak with the administrator, Cheri Guppy RN.  Patient did call once for the restroom.

## 2020-02-13 NOTE — Progress Notes (Signed)
Speech Language Pathology Daily Session Note  Patient Details  Name: Roberto Lynch MRN: [id number] Date of Birth: May 25, 1955  Today's Date: 02/13/2020 SLP Individual Time: 1448-1540 SLP Individual Time Calculation (min): 52 min  Short Term Goals: Week 1: SLP Short Term Goal 1 (Week 1): Patient will consume trial PO trays of Dys 3 solids without overt s/s of difficulty or aspiration/penetration SLP Short Term Goal 2 (Week 1): Patient will demonstrate orientation to time, situation, place with minA. SLP Short Term Goal 3 (Week 1): Patient will demonstrate adequate safety awareness during functional tasks with modA SLP Short Term Goal 4 (Week 1): Patient will perform mildly complex problem solving tasks with modA SLP Short Term Goal 5 (Week 1): Patient will increase vocal intensity and clarity at phrase-sentence levels with 80% accuracy and minA  Skilled Therapeutic Interventions:Skilled ST services focused on cognitive skills. Nursing director was in room talking to pt who was requesting to leave AMA. Nursing director and SLP provided education pertaining to current deficits and safety. Pt called his wife on the phone she appear to support some baseline behavior as well as cognitive changes, but was unclear. Pt was agreeable to participate in formal cognitive assessment Cognistat. Pt demonstrated WFL in all areas except for construction task targeting mildly complex problem solving and had difficulty with recall and selective attention within the task. Pt was somewhat agreeable to errors in construction task and working on problem solving skills in therapy. Pt was left in room with call bell within reach and bed alarm set. SLP recommends to continue skilled services.     Pain Pain Assessment Pain Score: 0-No pain  Therapy/Group: Individual Therapy  Althea Backs  [facility] 02/13/2020, 4:35 PM

## 2020-02-14 ENCOUNTER — Inpatient Hospital Stay (HOSPITAL_COMMUNITY): Payer: 59 | Admitting: Occupational Therapy

## 2020-02-14 ENCOUNTER — Inpatient Hospital Stay (HOSPITAL_COMMUNITY): Payer: 59

## 2020-02-14 ENCOUNTER — Encounter (HOSPITAL_COMMUNITY): Payer: 59 | Admitting: Psychology

## 2020-02-14 NOTE — Progress Notes (Signed)
Patient resting a intervals throughout shift in no acute respiratory distress distress, no extreme agitation or irritability noted, Able to follow Behavior Plan with good responses from patient,Continue to monitor sleep chart results 7/12 hours patient was asleep.Delton See- safety monitoring continue. Bed alarm and call bell in place. Continue regime

## 2020-02-14 NOTE — Progress Notes (Signed)
[place] PHYSICAL MEDICINE & REHABILITATION PROGRESS NOTE  Subjective/Complaints: Mr. Roberto Lynch prefers to be discharged home soon and has been refusing therapies. Alertness and orientation have improved and patient appears to be able to make his own decisions.   ROS: Denies CP, SOB, N/V/D  Objective: Vital Signs: Blood pressure (!) 142/82, pulse 70, temperature 97.9 F (36.6 C), temperature source Oral, resp. rate 20, height 5\' 9"  (1.753 m), weight 117.2 kg, SpO2 97 %. No results found. Recent Labs    02/13/20 0734  WBC 7.4  HGB 13.8  HCT 43.2  PLT 294   Recent Labs    02/13/20 0734  NA 138  K 3.7  CL 103  CO2 25  GLUCOSE 109*  BUN 14  CREATININE 0.82  CALCIUM 10.0    Intake/Output Summary (Last 24 hours) at 02/14/2020 1414 Last data filed at 02/14/2020 0752 Gross per 24 hour  Intake 720 ml  Output 975 ml  Net -255 ml        Physical Exam: BP (!) 142/82 (BP Location: Right Arm)   Pulse 70   Temp 97.9 F (36.6 C) (Oral)   Resp 20   Ht 5\' 9"  (1.753 m)   Wt 117.2 kg   SpO2 97%   BMI 38.16 kg/m    General: Alert and oriented x 3, No apparent distress HEENT: Head is normocephalic, Poor dentition. Neck: Trach with PMV and trach collar. Heart: Irregularly irregular... No murmurs rubs or gallops Chest: CTA bilaterally without wheezes, rales, or rhonchi; no distress Abdomen: Soft, non-tender, non-distended, bowel sounds positive. Extremities: No clubbing, cyanosis, or edema. Pulses are 2+ Skin: Right lower extremity cast CD Neuro: Alert and oriented x2. Cast to right lower extremity, otherwise no edema or tenderness. Musculoskeletal: Full ROM, No pain with AROM or PROM in the neck, trunk, or extremities. Posture appropriate Psych: [name] affect is appropriate. Pt is cooperative  Assessment/Plan: 1. Functional deficits secondary to polytrauma which require 3+ hours per day of interdisciplinary therapy in a comprehensive inpatient rehab setting.  Physiatrist  is providing close team supervision and 24 hour management of active medical problems listed below.  Physiatrist and rehab team continue to assess barriers to discharge/monitor patient progress toward functional and medical goals   Care Tool:  Bathing    Body parts bathed by patient: Right arm, Left arm, Chest, Abdomen, Front perineal area, Right upper leg, Left upper leg, Face, Buttocks   Body parts bathed by helper: Left lower leg Body parts n/a: Right lower leg   Bathing assist Assist Level: Minimal Assistance - Patient > 75%     Upper Body Dressing/Undressing Upper body dressing   What is the patient wearing?: Pull over shirt    Upper body assist Assist Level: Supervision/Verbal cueing    Lower Body Dressing/Undressing Lower body dressing      What is the patient wearing?: Underwear/pull up, Pants     Lower body assist Assist for lower body dressing: Minimal Assistance - Patient > 75%     Toileting Toileting    Toileting assist Assist for toileting: Moderate Assistance - Patient 50 - 74%     Transfers Chair/bed transfer  Transfers assist  Chair/bed transfer activity did not occur: Safety/medical concerns  Chair/bed transfer assist level: Minimal Assistance - Patient > 75% (slideboard)     Locomotion Ambulation   Ambulation assist   Ambulation activity did not occur: Safety/medical concerns          Walk 10 feet activity   Assist  Walk 10 feet activity did not occur: Safety/medical concerns        Walk 50 feet activity   Assist Walk 50 feet with 2 turns activity did not occur: Safety/medical concerns         Walk 150 feet activity   Assist Walk 150 feet activity did not occur: Safety/medical concerns         Walk 10 feet on uneven surface  activity   Assist Walk 10 feet on uneven surfaces activity did not occur: Safety/medical concerns         Wheelchair     Assist Will patient use wheelchair at discharge?:  Yes Type of Wheelchair: Manual    Wheelchair assist level: Supervision/Verbal cueing Max wheelchair distance: 147ft    Wheelchair 50 feet with 2 turns activity    Assist        Assist Level: Supervision/Verbal cueing   Wheelchair 150 feet activity     Assist      Assist Level: Supervision/Verbal cueing    Medical Problem List and Plan: 1. 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  Continue CIR  2.  Antithrombotics: -DVT/anticoagulation:  Pharmaceutical: Other (comment) Eliquis             -antiplatelet therapy: NA 3. Pain Management: Oxycodone prn   Controlled 10/26.   Monitor with increased exertion 4. Mood: Team support  Klonopin 1 mg twice daily- increased to TID PRN for agitation.   In better mood since permitted to D/C earlier.              -antipsychotic agents: Seroquel 200 twice daily, changed to daily on 10/23 5. Neuropsych: This patient is capable of making decisions on his own behalf.   Telesitter for safety 6. Skin/Wound Care: Routine skin care and pressure relief measures 7. Fluids/Electrolytes/Nutrition: Routine I/Os. 8. 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 Vida Roller be discharged to home with trach and evaluated by outpatient Pulm for assessment of BIPAP needs prior to decannulation.   9. COPD: Continue Brovana/budesonide/hypertonic nebs bid,  and robitussin every 6 hours.   10. Hepatic encephalopathy/ETOH abuse: No longer requiring prn Haldol.   Hyperammonemia-lactulose started, increased on 10/22, increased again on 10/23  Will consider further increase if necessary 10. A fib w/RVR:   Amiodarone             Eliquis  Rate controlled 10/25  Monitor with increased activity 11. Right ankle Fx:   X-ray showing healing, cast changed per Ortho 12.  Metabolic bone disease: Vitamin D supplement. 13.  Dysphagia  D3  thins, advance diet as tolerated 14.  Sundowning  See #4, #10  Continue melatonin  Ambien 2.5 ordered on 10/22, increased on 10/23  Improving 15.  Tobacco abuse  Educated patient on risks as well as policies 16. Disposition: Dr. Riley Kill and I discussed with patient regarding his desire to discharge home earlier. Family caregiver training today- wife is present. SW applying to [facility]- none accepting thus far.   LOS: 6 days A FACE TO FACE EVALUATION WAS PERFORMED  Drema Pry Alvira Hecht 02/14/2020, 2:14 PM

## 2020-02-14 NOTE — Progress Notes (Signed)
Patient was found to have cigarettes and matches in patient"s room by PT.  RN asked the patient on why he had these in his room, patient stated "I was going to go smoke."   RN informed him that smoking is not allowed and it is a fire hazard.  Patient stated, "no it's not but you can take them."  RN gave cigarettes and matches to Minus Breeding.
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
## 2020-02-14 NOTE — Progress Notes (Signed)
Physical Therapy Session Note  Patient Details  Name: Roberto Lynch MRN: [id number] Date of Birth: 03-01-1956  Today's Date: 02/14/2020 PT Individual Time: 1415-1456 PT Individual Time Calculation (min): 41 min   Short Term Goals: Week 1:  PT Short Term Goal 1 (Week 1): Pt will transfer to and from bed with mod assist and maintain NWB 50% of the time PT Short Term Goal 2 (Week 1): Pt will propell WC 100 ft with supervisoin assist PT Short Term Goal 3 (Week 1): Pt will perform sit<>stand in RW with mod assist and maintain NWB 50% of the time  Skilled Therapeutic Interventions/Progress Updates:    Patient received sitting edge of bed, agreeable to PT. He denies pain. Patient without humidified air on at this time stating "I don't need that shit." PT attempting to educate patient on following recommended guidelines for his care, including using humidified air as directed and maintaining NWBng status to R LE. Patient becoming argumentative with PT stating that he has "Scientist, research (medical)" (Guillain Irven Easterly) 15 years ago and that his B LE are "too weak" to be able to maintain weight bearing precautions. PT initially misunderstanding with "[employer]il[employer]" was and asked patient to describe, patient then calling this PT a "stupid little girl." He was able to transfer to wc from bed using slideboard and MinA, however, he was unable to maintain weight bearing precautions despite verbal cuing. Patient requesting to trial crutches for AD- PT explaining to patient that to safely use crutches with his injury, he must demonstrate gait pattern correctly (maintaining NWB to R LE) in the // bars first. Patient requiring MinA to come to standing, but immediately shifting ~100% of his weight onto R LE. PT placing R LE onto foot to gauge percentage of weight being applied- patient consistently removing his foot from on top to place on the ground. PT had patient sit back down as he was not able to adhere to weight bearing precautions  to continue task safely. Patient complaining that his plaster cast was cracked on the plantar surface again- PT attempted to explain that the type of cast he has is not meant for weight bearing and that it would continue to crack if he put weight through it. Patient propelling wc ~18ft before stating he was too fatigued to continue. PT propelling patient remainder of way back to his room. Patient requesting to remain up in wc. When PT was placing seatbelt alarm on patient he became very argumentative and degrading toward this PT. Seatbelt alarm on, call light within reach.  *Of note:  PT found cigarettes and matchbook in patients top drawer. RN and charge nurse aware. Patient not receptive to education regarding not smoking inside hospital or on hospital grounds.   Therapy Documentation Precautions:  Precautions Precaution Comments: trach (with PMV) Required Braces or Orthoses: Splint/Cast Splint/Cast: R LE Restrictions Weight Bearing Restrictions: Yes RLE Weight Bearing: Non weight bearing    Therapy/Group: Individual Therapy  Elizebeth Koller, PT, DPT, CBIS 02/14/2020, 7:51 AM

## 2020-02-14 NOTE — Progress Notes (Signed)
Physical Therapy Session Note  Patient Details  Name: Roberto Lynch MRN: [id number] Date of Birth: 1955-05-11  Today's Date: 02/14/2020 PT Individual Time: 9147-8295 PT Individual Time Calculation (min): 49 min   Short Term Goals: Week 1:  PT Short Term Goal 1 (Week 1): Pt will transfer to and from bed with mod assist and maintain NWB 50% of the time PT Short Term Goal 2 (Week 1): Pt will propell WC 100 ft with supervisoin assist PT Short Term Goal 3 (Week 1): Pt will perform sit<>stand in RW with mod assist and maintain NWB 50% of the time  Skilled Therapeutic Interventions/Progress Updates:   Received pt sitting EOB with wife present at bedside, pt agreeable to therapy, and denied any pain during session. Session with emphasis on adhering to WB precautions, family education, functional mobility/transfers, generalized strengthening, dynamic standing balance/coordination, and improved activity tolerance. Pt continues to remain verbose and frequently using profanity during sessions. [name] reported pt does not have RW or WC at home but they have a ramp to enter. CSW present for this part of session. Pt continues to remain frustrated with WB restrictions stating he is switching doctors and requesting to use crutches stating "If you give me crutches I can walk all over this place." Educated pt/pt's wife on reasoning behind this therapist's recommendation for RW over crutches, however pt unreceptive to education. Pt transferred bed<>WC stand<>pivot with RW and mod A with pt not adhering to R LE NWB precautions despite max cues from therapist. Therapist attempted to educate pt on anatomy behind injury; however continues to do things his own way. Educated wife on NWB precautions and anatomy behind injury with wife stating "he's just stubborn." Donned WC gloves and pt performed WC mobility 141ft using bilateral UEs and supervision to therapy gym. [name] then left session to get food. Pt transferred  sit<>stand inside // bars with mod A +2 for safety with cues for technique to maintain precautions. Pt putting full weight in R LE despite cues from therapist. Once standing pt able to maintain L SLS ~15-20 seconds prior to sitting with max encouragement. Pt transferred sit<>stand additional 3 reps with mod A with pt placing R LE on therapist foot and verbal cues "don't squish my toes", however pt still placing 50-75% of weight on R LE. Pt transported back to room in [facility] total A and transferred WC<>bed via slideboard with min A with pt performing partial stands on slideboard again not adhering to WB precautions. Concluded session with pt sitting EOB, needs within reach, and bed alarm on.   Therapy Documentation Precautions:  Precautions Precaution Comments: trach (with PMV) Required Braces or Orthoses: Splint/Cast Splint/Cast: R LE Restrictions Weight Bearing Restrictions: Yes RLE Weight Bearing: Non weight bearing   Therapy/Group: Individual Therapy [name] PT, DPT   02/14/2020, 7:27 AM

## 2020-02-14 NOTE — Progress Notes (Addendum)
Patient ID: Roberto Lynch, male   DOB: November 29, 1955, 64 y.o.   MRN: [id number] Met with Dr Naaman Plummer who talked with wife and pt regarding discharge soon since pt doe snot want to be here and has refused therapies. Went in to discuss with wife and pt the need for education with family member who will be there while pt's wife and daughter works during the day. [name] niece can be here tomorrow at 10:00 for education. Wife will attend therapies today with pt. Pt feels he can manage at home and has no issues with going home. Anna-PT in room and will let worker know equipment needed. With his Christella Scheuermann will probably not be able to get him home health and will need to do OP therapies. If MD feels pt has changed and is able to make is own decisions will need to change in his record.   12:36 PM Met with pt and wife to discuss equipment needed-wheelchair, bsc, transfer board and trach supplies and humidified air-have ordered all through [employer]. Since wife has observed in therapies have asked her to come in tomorrow with her niece to go through training. Niece has dealt with trach since her family member had one. Wife is agreeable to come in with niece tomorrow. Discussed with [employer] no home health agency will accept his referral. Pt is fine with this and feels he doesn't need home health. He has gone to [facility] before and will go back there. Although they only have OPPT no OT nor SPT. Will work on discharge needs. Pt is high risk to return to the hospital due to does not follow recommendations and family members do no seem to be able to make him either.  1:18 PM Called front desk-[name] to inform of [name]-niece and wife coming in for family education tomorrow at 10:00 Have reached out to multiple home health agencies-[facility], [facility], [facility], [facility], [facility], [facility], [facility], etc All would not take pt's referral.

## 2020-02-14 NOTE — Progress Notes (Signed)
Patient requesting a new physician to take over care.  Patient informed that the RN will send a note to the doctor letting him know his request.  The patient stated, "thank you".   Message sent to Dr. Allena Katz and Dr. Carlis Abbott.

## 2020-02-14 NOTE — Progress Notes (Signed)
Occupational Therapy Session Note  Patient Details  Name: Roberto Lynch MRN: [id number] Date of Birth: 1955/07/14  Today's Date: 02/14/2020 OT Individual Time: 0900-1000   &   1300-1330 OT Individual Time Calculation (min): 60 min    &   30 min   Short Term Goals: Week 1:  OT Short Term Goal 1 (Week 1): Pt will transfer with LRAD and 1 person to decrease BOC OT Short Term Goal 2 (Week 1): PT will maintain NWB precautions with MOD VC during ADLs/mobility OT Short Term Goal 3 (Week 1): Pt will thread BLE into pants with S and AE PRN  Skilled Therapeutic Interventions/Progress Updates:    AM session:   Patient in bed, alert.  Able to move from supine to sitting edge of bed with CS.  He expresses his frustration with being in the hospital and plan to go home soon.  He requires ongoing review of importance of NWB through right LE - he does not recall recent MD intervention or plan of care - provided information from recent note to include change of cast and plan for 2 more weeks of NWB status ( on 10/22), provided picture of bone that was fractured and discussed surgical intervention - he continues to express unsafe options.  Reviewed DME needs for discharge to home to include drop arm commode.  He perseverates on the doctor not allowing him to leave - continued attempts to discuss safe plan and his need to demonstrate ability to maintain NWB.   Attempted to complete functional transfers this session but patient continues with perseverative speech.  Wife present and close of session.  Patient remained seated edge of bed, bed alarm set and call bell/tray table in reach.     PM session:   Patient lying in bed, wife present.  Supine to sitting edge of bed with CGA.  Reviewed NWB status and purpose.  Patient continues to argue need for keeping weight off of right LE and demonstrates poor problem solving.  He states that he will be able to maintain NWB with crutches.  He agrees to complete bed to/from w/c  transfer with slide board and verbalizes understanding of process but unable to carryover during transfer and proceeds to pull SB out from under himself mid transfer and put weight through R LE.  He completes LB AROM activities w/c level with cues for attention to task.  Discussed appropriate exercises for current condition.  SB transfer back to bed with min A, improved carryover of technique but poor compliance with NWB.  He returned to lying position in bed, bed alarm set and call bell in reach.      Therapy Documentation Precautions:  Precautions Precaution Comments: trach (with PMV) Required Braces or Orthoses: Splint/Cast Splint/Cast: R LE Restrictions Weight Bearing Restrictions: Yes RLE Weight Bearing: Non weight bearing  Therapy/Group: Individual Therapy  Barrie Lyme 02/14/2020, 7:35 AM

## 2020-02-15 ENCOUNTER — Inpatient Hospital Stay (HOSPITAL_COMMUNITY): Payer: 59 | Admitting: Speech Pathology

## 2020-02-15 ENCOUNTER — Inpatient Hospital Stay (HOSPITAL_COMMUNITY): Payer: 59

## 2020-02-15 ENCOUNTER — Encounter (HOSPITAL_COMMUNITY): Payer: 59 | Admitting: Occupational Therapy

## 2020-02-15 MED ORDER — QUETIAPINE FUMARATE 200 MG PO TABS: 200.0000 mg | ORAL_TABLET | Freq: Two times a day (BID) | ORAL | 0 refills | Status: DC

## 2020-02-15 MED ORDER — VITAMIN D3 25 MCG PO TABS: 2000.0000 [IU] | ORAL_TABLET | Freq: Every day | ORAL | 0 refills | Status: DC

## 2020-02-15 MED ORDER — CLONAZEPAM 1 MG PO TBDP: 1.0000 mg | ORAL_TABLET | Freq: Two times a day (BID) | ORAL | 0 refills | Status: DC | PRN

## 2020-02-15 MED ORDER — AMLODIPINE BESYLATE 10 MG PO TABS: 10.0000 mg | ORAL_TABLET | Freq: Every day | ORAL | 0 refills | Status: DC

## 2020-02-15 MED ORDER — BETHANECHOL CHLORIDE 10 MG PO TABS: 10.0000 mg | ORAL_TABLET | Freq: Three times a day (TID) | ORAL | 0 refills | Status: DC

## 2020-02-15 MED ORDER — MELATONIN 5 MG PO TABS: 10.0000 mg | ORAL_TABLET | Freq: Every day | ORAL | 0 refills | Status: AC

## 2020-02-15 MED ORDER — GUAIFENESIN 100 MG/5ML PO SOLN: 5.0000 mL | Freq: Four times a day (QID) | ORAL | 0 refills | Status: DC

## 2020-02-15 MED ORDER — LACTULOSE 10 GM/15ML PO SOLN: 20.0000 g | Freq: Three times a day (TID) | ORAL | 0 refills | Status: DC

## 2020-02-15 MED ORDER — ALBUTEROL SULFATE HFA 108 (90 BASE) MCG/ACT IN AERS: 2.0000 | INHALATION_SPRAY | Freq: Four times a day (QID) | RESPIRATORY_TRACT | 1 refills | Status: AC | PRN

## 2020-02-15 MED ORDER — THIAMINE HCL 100 MG PO TABS: 100.0000 mg | ORAL_TABLET | Freq: Every day | ORAL | 0 refills | Status: DC

## 2020-02-15 MED ORDER — DOCUSATE SODIUM 100 MG PO CAPS: 100.0000 mg | ORAL_CAPSULE | Freq: Every day | ORAL | 0 refills | Status: DC

## 2020-02-15 MED ORDER — BUDESONIDE-FORMOTEROL FUMARATE 160-4.5 MCG/ACT IN AERO: 2.0000 | INHALATION_SPRAY | Freq: Two times a day (BID) | RESPIRATORY_TRACT | 12 refills | Status: DC

## 2020-02-15 MED ORDER — PANTOPRAZOLE SODIUM 40 MG PO TBEC: 40.0000 mg | DELAYED_RELEASE_TABLET | Freq: Two times a day (BID) | ORAL | 0 refills | Status: AC

## 2020-02-15 MED ORDER — ADULT MULTIVITAMIN W/MINERALS CH: 1.0000 | ORAL_TABLET | Freq: Every day | ORAL | Status: DC

## 2020-02-15 MED ORDER — AMIODARONE HCL 200 MG PO TABS: 200.0000 mg | ORAL_TABLET | Freq: Every day | ORAL | 0 refills | Status: DC

## 2020-02-15 MED ORDER — APIXABAN 5 MG PO TABS: 5.0000 mg | ORAL_TABLET | Freq: Two times a day (BID) | ORAL | 0 refills | Status: DC

## 2020-02-15 MED ORDER — FOLIC ACID 1 MG PO TABS: 1.0000 mg | ORAL_TABLET | Freq: Every day | ORAL | 0 refills | Status: DC

## 2020-02-15 NOTE — Progress Notes (Signed)
PCCM Update: - Patient stable for discharge from a respiratory standpoint.  - He has trach supplies ordered and will be educated by nursing/respiratory on how to maintain the trach. - He has been scheduled for trach care follow up and sleep follow up in our clinic.   Melody Comas, MD [facility] Office: [phone]   See Amion for Pager Details
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
## 2020-02-15 NOTE — Progress Notes (Signed)
Patient slept  Approximately 9 1/2 hours - sleep chart, anticipates discharge todat
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
## 2020-02-15 NOTE — Progress Notes (Addendum)
Patient ID: Roberto Lynch, male   DOB: 1956-01-30, 64 y.o.   MRN: [id number]  Wife and niece in room to learn pt's care and then take him home. Equipment to be delivered this am to room and trach supplies may go to his home. Pt ready to go home and feels can do his own care. Education set up at 10:00 to begin. Pt feels no SP or OT is needed he wants PT and wants to go to OPPT at [facility] where he went before.   10:36 Am According to Deborah-RN respiratory team educated wife and pt on his trach and  care yesterday.

## 2020-02-15 NOTE — Progress Notes (Signed)
Occupational Therapy Session Note  Patient Details  Name: Roberto Lynch MRN: [id number] Date of Birth: 03-Sep-1955  Today's Date: 02/15/2020 OT Individual Time: 3825-0539 OT Individual Time Calculation (min): 42 min    Short Term Goals: Week 1:  OT Short Term Goal 1 (Week 1): Pt will transfer with LRAD and 1 person to decrease BOC OT Short Term Goal 2 (Week 1): PT will maintain NWB precautions with MOD VC during ADLs/mobility OT Short Term Goal 3 (Week 1): Pt will thread BLE into pants with S and AE PRN  Skilled Therapeutic Interventions/Progress Updates:    Treatment session with focus on hands on family education in preparation for d/c home.  Pt received supine in bed with wife and niece present in room.  Engaged in discussion regarding NWB status of RLE and implications of NWB on functional transfers and self-care tasks.  Pt verbalizing frustration with NWB status and reports that he should be able to bear weight by now.  Engaged in sit > stand from EOB with max cues for NWB and cues for placement of RLE to decrease WB.  Pt voicing frustration and reporting "do you want to see how I will do it at home?"  Pt engaged in sit > stand with RLE curled behind himself and then placed RLE on floor once semi-standing with RW.  Therapist educated pt and family members at length, how this technique was unsafe and again reiterated the NWB order.  Discussed completing LB bathing and dressing with lateral leans to diminish instances of WB in standing, therapist demonstrating lateral leans as pt has completed LB dressing with this technique.  Discussed recommendation for drop arm BSC to allow for lateral scoot vs slide board transfers.  Pt completed slide board transfer with CGA bed <> drop arm BSC.  Upon encouragement to have pt complete transfer again with family members to allow hands on and practice with placement of slide board, pt stated "this is how I will do it at home" and positioned w/c at 90* angle from  bed and completed squat pivot transfer without placing any weight through RLE, and therapist providing CGA from behind. Therapist discussed with pt and family that this could be an option, as pt was safe and did not place any weight through RLE.  Pt completed squat pivot transfer x3 in this manner while maintaining NWB through RLE and demonstrating adequate safety awareness.  Discussed at length, recommending bathing at EOB or at sink, as pt wanting to "cut off cast" and get in the jacuzzi.  Reiterated maintaining cast until removed by medical professional and maintaining cast dry.  Therapist providing frequent redirection during therapy session due to pt tangential discussion and frequent impulsivity both with movements and statements.   Pt continues to be a high fall risk due to impulsivity and decreased safety awareness.  Pt does not adhere to NWB during transfers and LB dressing, due to impulsivity.  [name] family has been present for education but due to pt strong personality, pt continues to be at high fall risk.   Therapy Documentation Precautions:  Precautions Precautions: Fall Precaution Comments: trach (with PMV) Required Braces or Orthoses: Splint/Cast Splint/Cast: R LE Restrictions Weight Bearing Restrictions: Yes RLE Weight Bearing: Non weight bearing General: General PT Missed Treatment Reason: Patient unwilling to participate Vital Signs: Therapy Vitals Temp: 98.5 F (36.9 C) Pulse Rate: 81 Resp: 17 BP: (!) 143/99 Patient Position (if appropriate): Sitting Oxygen Therapy SpO2: (!) 87 % O2 Device: Tracheostomy Collar Pain:  Pain Assessment Pain Scale: 0-10 Pain Score: 0-No pain   Therapy/Group: Individual Therapy  Rosalio Loud 02/15/2020, 4:50 PM

## 2020-02-15 NOTE — Progress Notes (Signed)
Speech Language Pathology Discharge Summary  Patient Details  Name: Roberto Lynch MRN: [id number] Date of Birth: 11/30/1955  Today's Date: 02/15/2020 SLP Individual Time: 1200-1235 SLP Individual Time Calculation (min): 35 min   Skilled Therapeutic Interventions:  Patient seen with wife present for family education as patient will be discharging today after therapies. SLP provided handout and demonstrated donning and doffing and cleaning of PMV. Wife understanding and able to return demonstrate. Stressed to patient and family the importance of keeping his PMV and trach clean and dry. Patient is adamant that he will be returning to daily activities such as driving car, tractor, etc. SLP focused education on PMV care. All questions were answered to patient and family's satisfaction and education completed.     Patient has met 8 of 5 long term goals.  Patient to discharge at overall Supervision;Min level.  Reasons goals not met: per patient's request, discharge date was reduced from anticipated 2 and 1/2 weeks to to 6 days   Clinical Impression/Discharge Summary: Patient met 5/8 LTG"s related to cognitive-linguistic, speech/voice and swallow function. He is tolerating a Dys 3 (mechanical soft) solids, thin liquids diet with intermittent supervision and tolerating PMV all waking hours without difficulty. He has improved with his overall alertness and orientation to time, but continues to make absurd statements as well without any awareness. Difficult to differentiate premorbid cognitive function and personality from current cognitive functioning, but he is very set on thinking his current leg injury will go the same course as previous one. Patient has been resistant to therapy and medical interventions but has particpiated in therapies. SLP trained wife on care and donning/doffing PMV speaking valve and she was able to return demonstrate. SLP HH recommended for cognitive therapy, however patient with  poor compliance overall.  Care Partner:  Caregiver Able to Provide Assistance: Yes  Type of Caregiver Assistance: Cognitive;Physical  Recommendation:  24 hour supervision/assistance;Home Health SLP  Rationale for SLP Follow Up: Maximize cognitive function and independence   Equipment: none recommended by SLP   Reasons for discharge: Discharged from hospital   Patient/Family Agrees with Progress Made and Goals Achieved: Yes    T. , MA, CCC-SLP Speech Therapy   

## 2020-02-15 NOTE — Progress Notes (Signed)
Upon approaching patient in room he was alert oriented x2-3 sitting in wheelchair at bedside, He informed writer that "Im getting out of here tomorrow and if I don't there is going to be a lot of damn problem here'. States also he" has spoken with Dr Riley Kill earlier today and that he (indicating Dr Riley Kill) "understand he will be leaving tomorrow with no issues as long as he does what he was told by him today".Patient is very direct and straight forward with his conversation during this time>Denies pain or discomfort at this time.Patient informed Clinical research associate that his "Wife was present today and had learned all she needed to know in order to take care of him,when he goes home". Observed that this patient is currently on the phone with his spouse,while discussing this conversation. Spouse informed patient she will be at the hospital around 8AM to pick him upr.

## 2020-02-15 NOTE — Progress Notes (Signed)
Patient off Eliquis since 10/26 per review of chart Pharmacy discussed risks of Eliquis with patient's history of ETOH abuse and low CHAD2Vasc score and felt he was at too high risk for complication. He's also more than 6 weeks out from ortho surgery therefore decision was made to d/c [facility].

## 2020-02-15 NOTE — Progress Notes (Signed)
Occupational Therapy Discharge Summary  Patient Details  Name: Roberto Lynch MRN: [id number] Date of Birth: Aug 31, 1955   Patient has met 8 of 11 long term goals due to improved activity tolerance.  Patient to discharge at [facility] Assist level.  Patient's care partner is independent to provide the necessary physical and cognitive assistance at discharge.  Patient with limited participation during therapy sessions, requiring max redirection and cues to maintain NWB through RLE during sit > stand and functional transfers.  Pt with limited carryover of education due to impulsivity, decreased safety awareness, and problem solving. Patient's wife and niece attended family education sessions with primary focus being on safety and importance of RLE NWB precautions during functional mobility.  Therapist strongly recommending lateral leans for LB bathing and dressing and either use of slide board or squat pivot for transfers to maintain NWB through RLE.  Pt continues to be quite impulsive with decreased safety awareness and will require frequent cues for safety during mobility.  Pt continues to state "let me do this my way" despite cues from therapist and family members.  Reasons goals not met:  Due to pt requests to d/c prior to proposed d/c date and decreased safety awareness and willingness to adhere to RLE NWB precautions, pt did not meet 3 goals. Pt continues to require min assist for LB bathing, min-mod assist for toileting (as pt has not completed during therapy sessions and has been receiving assistance from nursing from a safety standpoint), and mod-max cues for safety awareness due to impulsivity and insistence on putting weight through RLE during transfers and sit > stand.    Recommendation:  Patient will benefit from ongoing skilled OT services in home health setting to continue to advance functional skills in the area of BADL and Reduce care partner burden.  Equipment: drop arm BSC  Reasons  for discharge: discharge from hospital  Patient/family agrees with progress made and goals achieved: Yes  OT Discharge Precautions/Restrictions  Precautions Precautions: Fall Precaution Comments: trach (with PMV) Required Braces or Orthoses: Splint/Cast Splint/Cast: R LE Pain Pain Assessment Pain Scale: 0-10 Pain Score: 0-No pain ADL ADL Eating: Set up Where Assessed-Eating: Bed level Grooming: Setup Where Assessed-Grooming: Sitting at sink Upper Body Bathing: Supervision/safety, Setup Where Assessed-Upper Body Bathing: Edge of bed Lower Body Bathing: Minimal assistance Where Assessed-Lower Body Bathing: Other (Comment) (BSC) Upper Body Dressing: Setup Where Assessed-Upper Body Dressing: Edge of bed Lower Body Dressing: Contact guard Where Assessed-Lower Body Dressing: Bed level, Edge of bed Toileting: Moderate assistance Where Assessed-Toileting: Bedside Commode Toilet Transfer: Contact guard Toilet Transfer Method: Squat pivot Toilet Transfer Equipment: Drop arm bedside commode Vision Baseline Vision/History: Wears glasses Wears Glasses: At all times Vision Assessment?: No apparent visual deficits Perception  Perception: Within Functional Limits Praxis Praxis: Intact Cognition Overall Cognitive Status: Impaired/Different from baseline Arousal/Alertness: Awake/alert Attention: Sustained Focused Attention: Appears intact Sustained Attention: Impaired Memory: Impaired Memory Impairment: Retrieval deficit;Decreased recall of new information Awareness: Impaired Awareness Impairment: Intellectual impairment Problem Solving: Impaired Behaviors: Impulsive Safety/Judgment: Impaired Sensation Sensation Light Touch: Appears Intact Proprioception: Appears Intact Coordination Gross Motor Movements are Fluid and Coordinated: No Fine Motor Movements are Fluid and Coordinated: Yes Coordination and Movement Description: grossly uncoordinated due to R LE cast,  generalized weakness, and poor endurance. Pt does not adhere to R LE NWB precautions. Motor  Motor Motor: Abnormal postural alignment and control Motor - Skilled Clinical Observations: grossly uncoordinated due to R LE cast, generalized weakness, and poor endurance. Pt does not adhere  to R LE NWB precautions. Mobility  Bed Mobility Bed Mobility: Rolling Right;Rolling Left;Supine to Sit;Sit to Supine Rolling Right: Supervision/verbal cueing Rolling Left: Supervision/Verbal cueing Supine to Sit: Supervision/Verbal cueing Sit to Supine: Supervision/Verbal cueing Transfers Sit to Stand: Minimal Assistance - Patient > 75% (RW) Stand to Sit: Contact Guard/Touching assist  Trunk/Postural Assessment  Cervical Assessment Cervical Assessment: Exceptions to [facility] (forward head) Thoracic Assessment Thoracic Assessment: Exceptions to [facility] (kyphosis) Lumbar Assessment Lumbar Assessment: Exceptions to [facility] (posterior pelvic tilt) Postural Control Postural Control: Deficits on evaluation  Balance Balance Balance Assessed: Yes Static Sitting Balance Static Sitting - Balance Support: Feet supported;Bilateral upper extremity supported Static Sitting - Level of Assistance: 7: Independent Dynamic Sitting Balance Dynamic Sitting - Balance Support: Bilateral upper extremity supported;Feet supported Dynamic Sitting - Level of Assistance: 7: Independent Static Standing Balance Static Standing - Balance Support: Bilateral upper extremity supported (RW) Static Standing - Level of Assistance: 5: Stand by assistance (CGA (however, pt not adhering to R LE NWB precautions)) Dynamic Standing Balance Dynamic Standing - Balance Support: Bilateral upper extremity supported (RW) Dynamic Standing - Level of Assistance: 4: Min assist ((however, pt not adhering to R LE NWB precautions)) Extremity/Trunk Assessment RUE Assessment RUE Assessment: Within Functional Limits LUE Assessment LUE Assessment: Within Functional  Limits   Erhardt Dada, [facility] 02/16/2020, 4:38 PM

## 2020-02-15 NOTE — Plan of Care (Signed)
  Problem: Consults Goal: RH GENERAL PATIENT EDUCATION Description: See Patient Education module for education specifics. Outcome: Completed/Met Goal: Skin Care Protocol Initiated - if Braden Score 18 or less Description: Skin will remain intact during hospital stay. Outcome: Completed/Met Goal: Nutrition Consult-if indicated Outcome: Completed/Met   Problem: RH BOWEL ELIMINATION Goal: RH STG MANAGE BOWEL WITH ASSISTANCE Description: Patient will manage bowels with assistance Outcome: Completed/Met Goal: RH STG MANAGE BOWEL W/MEDICATION W/ASSISTANCE Description: STG Manage Bowel with Medication with Assistance. Outcome: Completed/Met   Problem: RH BLADDER ELIMINATION Goal: RH STG MANAGE BLADDER WITH ASSISTANCE Description: STG Manage Bladder With Assistance Outcome: Completed/Met Goal: RH STG MANAGE BLADDER WITH EQUIPMENT WITH ASSISTANCE Description: STG Manage Bladder With Equipment With Assistance Outcome: Completed/Met   Problem: RH SKIN INTEGRITY Goal: RH STG SKIN FREE OF INFECTION/BREAKDOWN Description: Patient's skin will remain intact during hospital admission. Outcome: Completed/Met Goal: RH STG MAINTAIN SKIN INTEGRITY WITH ASSISTANCE Description: STG Maintain Skin Integrity With Assistance. Outcome: Completed/Met   Problem: RH SAFETY Goal: RH STG ADHERE TO SAFETY PRECAUTIONS W/ASSISTANCE/DEVICE Description: STG Adhere to Safety Precautions With Assistance/Device. Outcome: Completed/Met Goal: RH STG DECREASED RISK OF FALL WITH ASSISTANCE Description: STG Decreased Risk of Fall With Assistance. Outcome: Completed/Met   Problem: RH PAIN MANAGEMENT Goal: RH STG PAIN MANAGED AT OR BELOW [name] PAIN GOAL Outcome: Completed/Met

## 2020-02-15 NOTE — Progress Notes (Signed)
Physical Therapy Discharge Summary  Patient Details  Name: Roberto Lynch MRN: [id number] Date of Birth: May 31, 1955  Patient has met 9 of 9 long term goals due to improved activity tolerance. Patient to discharge at a wheelchair level [place].  Patient's care partner is independent to provide the necessary physical assistance at discharge. [name] and niece attended family education training on 10/27. Therapist educated pt/pt's family on importance of maintaining R LE NWB precautions. [name] and niece verbalized and attempted to demonstrate understanding. However, pt ultimately unreceptive to education and cues from therapist and from family members stating "let me do this my way."  All goals met however of note pt does not adhere to R LE NWB precautions and is not receptive to any education from therapist. Pt is impulsive, has poor safety awareness and insight into deficits, and is easily agitated frequently speaking to this therapist in degrading terms. Family is aware of therapist's recommendations for safety with functional mobility and transfers and has verbalized and attempted to demonstrate confidence with transfers for discharge home.   Recommendation:  Patient will benefit from ongoing skilled PT services in home health setting to continue to advance safe functional mobility, address ongoing impairments in transfers, generalized strengthening, dynamic standing balance/coordination, adherence to R LE NWB precautions, endurance, and to minimize fall risk.  Equipment: 20x18 manual WC and RW  Reasons for discharge: discharge from hospital  Patient/family agrees with progress made and goals achieved: Yes  PT Discharge Precautions/Restrictions Precautions Precautions: Fall Precaution Comments: trach (with PMV) Required Braces or Orthoses: Splint/Cast Splint/Cast: R LE Restrictions Weight Bearing Restrictions: Yes RLE Weight Bearing: Non weight bearing Cognition Overall  Cognitive Status: Impaired/Different from baseline Arousal/Alertness: Awake/alert Orientation Level: Oriented to person;Oriented to place;Oriented to situation Memory: Impaired Awareness: Impaired Problem Solving: Impaired Behaviors: Impulsive Safety/Judgment: Impaired Sensation Sensation Light Touch: Appears Intact Proprioception: Appears Intact Coordination Gross Motor Movements are Fluid and Coordinated: No Coordination and Movement Description: grossly uncoordinated due to R LE cast, generalized weakness, and poor endurance. Pt does not adhere to R LE NWB precautions. Motor  Motor Motor: Abnormal postural alignment and control Motor - Skilled Clinical Observations: grossly uncoordinated due to R LE cast, generalized weakness, and poor endurance. Pt does not adhere to R LE NWB precautions.  Mobility Bed Mobility Bed Mobility: Rolling Right;Rolling Left;Supine to Sit;Sit to Supine Rolling Right: Supervision/verbal cueing Rolling Left: Supervision/Verbal cueing Supine to Sit: Supervision/Verbal cueing Sit to Supine: Supervision/Verbal cueing Transfers Transfers: Sit to Stand;Stand Pivot Transfers;Stand to Sit;Squat Pivot Transfers;Lateral/Scoot Transfers Sit to Stand: Minimal Assistance - Patient > 75% (RW) Stand to Sit: Contact Guard/Touching assist Stand Pivot Transfers: Minimal Assistance - Patient > 75% Stand Pivot Transfer Details: Verbal cues for technique;Verbal cues for precautions/safety;Verbal cues for safe use of DME/AE;Verbal cues for sequencing Stand Pivot Transfer Details (indicate cue type and reason): verbal cues to maintain R LE NWB precautions, AD safety, and technique. However, pt unreceptive to education and not adhering to NWB precautions Squat Pivot Transfers: Minimal Assistance - Patient > 75% Lateral/Scoot Transfers: Minimal Assistance - Patient > 75% (slideboard) Transfer (Assistive device): Other (Comment) (RW for sit<>stand and slideboard for lateral  scoot) Locomotion  Gait Ambulation: No (due to pt not adhering to R LE NWB precautions) Gait Gait: No (due to pt not adhering to R LE NWB precautions) Stairs / Additional Locomotion Stairs: No (due to pt not adhering to R LE NWB precautions) Wheelchair Mobility Wheelchair Mobility: Yes Wheelchair Assistance: Supervision/Verbal cueing Wheelchair Propulsion: Both upper  extremities Wheelchair Parts Management: Needs assistance Distance: 173f  Trunk/Postural Assessment  Cervical Assessment Cervical Assessment: Exceptions to [facility](forward head) Thoracic Assessment Thoracic Assessment: Exceptions to [facility](kyph[facility] Lumbar Assessment Lumbar Assessment: Exceptions to [facility](posterior pelvic tilt) Postural Control Postural Control: Deficits on evaluation  Balance Balance Balance Assessed: Yes Static Sitting Balance Static Sitting - Balance Support: Feet supported;Bilateral upper extremity supported Static Sitting - Level of Assistance: 7: Independent Dynamic Sitting Balance Dynamic Sitting - Balance Support: Bilateral upper extremity supported;Feet supported Dynamic Sitting - Level of Assistance: 7: Independent Static Standing Balance Static Standing - Balance Support: Bilateral upper extremity supported (RW) Static Standing - Level of Assistance: 5: Stand by assistance (CGA (however, pt not adhering to R LE NWB precautions)) Dynamic Standing Balance Dynamic Standing - Balance Support: Bilateral upper extremity supported (RW) Dynamic Standing - Level of Assistance: 4: Min assist ((however, pt not adhering to R LE NWB precautions)) Extremity Assessment   RLE Assessment RLE Assessment: Exceptions to [facility] St[facility] Comments: not formally tested; grossly 4/5 LLE Assessment LLE Assessment: Within Functional Limits   AAlfonse AlpersPT, DPT  02/15/2020, 12:07 PM

## 2020-02-15 NOTE — Discharge Instructions (Signed)
Inpatient Rehab Discharge Instructions  Roberto Lynch Discharge date and time:    Activities/Precautions/ Functional Status: Activity: no lifting, driving, or strenuous exercise till cleared by MD Diet: regular diet Wound Care: continue to perform trach care as instructed. Keep area clean and dry. Wear speaking valve during the day and keep it open at nights.    Functional status:  ___ No restrictions     ___ Walk up steps independently _X__ 24/7 supervision/assistance   ___ Walk up steps with assistance ___ Intermittent supervision/assistance  ___ Bathe/dress independently ___ Walk with walker     _X__ Bathe/dress with assistance ___ Walk Independently    ___ Shower independently ___ Walk with assistance    ___ Shower with assistance _X__ No alcohol     ___ Return to work/school ________   Special Instructions: 1.No driving or return to work till cleared by MD. 2. NO smoking.      COMMUNITY REFERRALS UPON DISCHARGE:    Outpatient: PT             Agency:[facility]  [phone]              Appointment Date/Time:WILL CALL AT HOME TO SET UP APPOINTMENT  Medical Equipment/Items Ordered:WHEELCHAIR, TRANSFER BOARD, DROP-ARM BEDSIDE COMMODE, TRACH SUPPLIES, HUMIDIFIED AIR                                                 Agency/Supplier:[facility]  [phone]    My questions have been answered and I understand these instructions. I will adhere to these goals and the provided educational materials after my discharge from the hospital.  Patient/Caregiver Signature _______________________________ Date __________  Clinician Signature _______________________________________ Date __________  Please bring this form and your medication list with you to all your follow-up doctor's appointments.

## 2020-02-15 NOTE — Patient Care Conference (Addendum)
Inpatient RehabilitationTeam Conference and Plan of Care Update Date: 02/16/2020   Time: 11:44 AM    Patient Name: Roberto Lynch      Medical Record Number: [id number]  Date of Birth: 1955-04-29 Sex: Male         Room/Bed: 4W11C/4W11C-01 Payor Info: Payor: [employer] / Plan: Research scientist (life sciences) / Product Type: *No Product type* /    Admit Date/Time:  02/08/2020  5:13 PM  Primary Diagnosis:  Multiple trauma  Hospital Problems: Principal Problem:   Multiple trauma Active Problems:   Trauma   Multiple traumatic injuries   Hyperammonemia (HCC)   Atrial fibrillation (HCC)   Status post tracheostomy (HCC)   Acute on chronic respiratory failure with hypercapnia (HCC)   Sundowning   Tobacco abuse    Expected Discharge Date: Expected Discharge Date: 02/15/20  Team Members Present: Physician leading conference: Dr. Maryla Morrow Care Coodinator Present: Chana Bode, RN, BSN, CRRN;Becky Dupree, LCSW Nurse Present: Adam Phenix, LPN PT Present: Raechel Chute, PT OT Present: Rosalio Loud, OT SLP Present: Elio Forget, SLP PPS Coordinator present : Edson Snowball, PT     Current Status/Progress Goal Weekly Team Focus  Bowel/Bladder   [place] is continent of B/B, LBM 02/14/2020  Maintain continence of bladder/ bowel  Assees toileting needs Q 2 hr/prn, in a timely manner   Swallow/Nutrition/ Hydration             ADL's   Min assist bathing and LB dressing, Setup UB dressing, CGA toilet transfers  CGA toileting, toilet transfers, and LB dressing, Supervision bathing and UB dressing  transfer training, family education, d/c planning   Mobility   bed mobility supervision, transfers mod A, WC mobility 160ft supervision, no gait due to pt not adhering to WB precautions  min A overall, supervision for WC mobility  adhering to WB precautions, functional mobility/transfers, generalized strengthening, dynamic standing balance, education, and improved activity tolerance   Communication              Safety/Cognition/ Behavioral Observations            Pain   Patient denes pain upon questioning amd monitoring  < 3  QS/PRN assessment,   Skin   TracheoTOMYmy intact, site unremarkable, Right lower leg cast site intact  Maintain skin integrity and appropriate cast care  Assess Trach and right cast sites QS/PRN, for changes or complications     Discharge Planning:  Family education today with wife and niece who will be with when wife is working. Pt insisting on going home and once completed will go. Uses poor judgement and is unsafe. Wife is aware and lets pt do what he chooses to do   Team Discussion: Patient is impulsive and having difficulties following weight bearing precautions, safety precautions, and instructions. Note premorbid cognitive deficits impair progress. Mechanical soft diet with trach in place and using PMSV. Respiratory reviewed trach care with patient and spouse. Referral to trach clinic pending. Patient on target to meet rehab goals: yes, needs reinforcement and repetition of instructions and notebook to write tips down. Supervision goals set.  *See Care Plan and progress notes for long and short-term goals.   Revisions to Treatment Plan:   Teaching Needs: Weight bearing precautions, medications, safety tips, trach care, transfers, toileting, etc.  Current Barriers to Discharge: Decreased caregiver support, Trach, Weight bearing restrictions, Medication compliance and Behavior Wife in poor health, limited family support Possible Resolutions to Barriers: Family education     Medical Summary Current Status: Confusion, dysphagia,  poor safety awareness, limitations with mobility, transfers, self-care secondary to polytrauma with history of GBS.  Barriers to Discharge: Behavior;Medication compliance   Possible Resolutions to [employer] Focus: Therapies, pt insistant on discharge today, family edu   Continued Need for Acute Rehabilitation Level of Care: The  patient requires daily medical management by a physician with specialized training in physical medicine and rehabilitation for the following reasons: Direction of a multidisciplinary physical rehabilitation program to maximize functional independence : Yes Medical management of patient stability for increased activity during participation in an intensive rehabilitation regime.: Yes Analysis of laboratory values and/or radiology reports with any subsequent need for medication adjustment and/or medical intervention. : Yes   I attest that I was present, lead the team conference, and concur with the assessment and plan of the team.   Chana Bode B 02/16/2020, 8:56 AM

## 2020-02-15 NOTE — Progress Notes (Signed)
Physical Therapy Session Note  Patient Details  Name: Roberto Lynch MRN: [id number] Date of Birth: 29-Dec-1955  Today's Date: 02/15/2020 PT Individual Time: [phone] and [id number] and 1500-1508 PT Individual Time Calculation (min): 25 min  and 38 min and 8 min Today's Date: 02/15/2020 PT Missed Time: 20 Minutes and 22 minutes Missed Time Reason: Other (Comment) (breathing treatment)  Short Term Goals: Week 1:  PT Short Term Goal 1 (Week 1): Pt will transfer to and from bed with mod assist and maintain NWB 50% of the time PT Short Term Goal 2 (Week 1): Pt will propell WC 100 ft with supervisoin assist PT Short Term Goal 3 (Week 1): Pt will perform sit<>stand in RW with mod assist and maintain NWB 50% of the time  Skilled Therapeutic Interventions/Progress Updates:   Treatment Session 1: 5427-0623 25 min Received pt sitting EOB receiving breathing treatment. Therapist returned 15 minutes later and breathing treatment completed. RN present to disconnect breathing treatment. Pt agreeable to therapy, and denied any pain during session. Session with emphasis on adhering to WB precautions, functional mobility/transfers, generalized strengthening, dynamic standing balance/coordination, toileting, simulated car transfers, and improved activity tolerance. Pt transferred bed<>WC via slideboard with min A. Pt arguing with therapist requesting to stand and pivot to WC. Therapist explained reasoning behind using slideboard to maintain R LE NWB precautions as pt unable to maintain precautions when standing, however pt unreceptive and stating "Who trained you? You're an idiot." Pt transported to ortho gym in [facility] dependently for time management purposes and performed simulated car transfer via lateral scoot with min A and max cues for safe entry and maintaining NWB precautions. Pt with increased agitation with therapist's cues for safety. Noted pt's shorts soiled with urine and returned to room dependently. Pt  continues to request to use crutches and again this therapist explained safety concerns regarding using crutches and increased difficulty maintaining NWB precautions, however pt unreceptive. Pt transferred WC<>bed via lateral scoot min A and transferred sit<>stand with RW and min A and required total A to doff soiled brief and shorts and to don clean ones. While standing pt not adhering to NWB precautions despite max cues and with 1 posterior LOB onto bed. Concluded session with pt sitting EOB, needs within reach, and bed alarm on. 20 minutes missed of skilled physical therapy due to pt receiving breathing treatment.   Treatment Session 2: 7628-3151 38 min Received pt sitting in bed finishing OT family education. [name] and niece present for family education training. Session with emphasis on family education, adhering to WB precautions, functional mobility/transfers, generalized strengthening, simulated car transfers, and improved activity tolerance. Therapist adjusted pt's WC and educated pt/pt's family on WC parts management including donning/doffing legrests and armrests. [name] able to manage WC parts with supervision. Pt transferred bed<>WC squat<>pivot with min A from pt's niece. Pt continues to not adhere to R LE NWB precautions despite cues from therapist and from family. Pulmonary MD present during session to discuss trach care at home. Pt transported to ortho gym in [facility] total A for time management purposes and performed simulated car transfer squat<>pivot without AD and min A from pt's niece. Therapist educated family on transfer set up, body mechanics/positioning, and cues to provide to pt to maintain R LE NWB precautions, however pt ultimately stating "let me do this my way". Upon exiting the car pt insisted on placing WC in front of him rather than to the side, coming into full stand and placing full weight  on R LE, and then turning to sit despite cues for safety. Returned to room and pt  transferred WC<>bed squat<>pivot with min A from pt's niece with pt standing, placing full weight on R LE, and then placing L LE on bed placing full weight on R LE and attempting to sit on L LE, required mod A from therapist and pt's niece to safety lower pt to bed. Therapist attempted to educate pt on safe transfer techniques, but again pt unreceptive. Concluded session with pt sitting EOB, needs within reach, and bed alarm on.   Treatment Session 3: 1500-1508 8 min Received pt sitting EOB awaiting discharge paperwork. Pt declined participating in therapy stating he is ready to leave. Family agreed and verbalized confidence with mobility tasks. Pt insisting this therapist bring him discharge paperwork. Therapist notified PA, Pam, who is planning to bring pt discharge information. Concluded session with pt sitting in [facility] and family present at bedside. 22 minutes missed of skilled physical therapy due to pt unwillingness to participate.   Therapy Documentation Precautions:  Precautions Precaution Comments: trach (with PMV) Required Braces or Orthoses: Splint/Cast Splint/Cast: R LE Restrictions Weight Bearing Restrictions: Yes RLE Weight Bearing: Non weight bearing  Therapy/Group: Individual Therapy [name] PT, DPT   02/15/2020, 7:29 AM

## 2020-02-15 NOTE — Progress Notes (Signed)
Inpatient Rehabilitation Care Coordinator  Discharge Note  The overall goal for the admission was met for:   Discharge location: Yes-HOME WITH WIFE, DAUGHTER, HIS SON-BROTHER AND NIECE TO ASSIST WHILE WIFE AND DAUGHTER AT WORK  Length of Stay: No-7 DAYS  Discharge activity level: No-MIN LEVEL  Home/community participation: Yes  Services provided included: MD, RD, PT, OT, SLP, RN, CM, Pharmacy, Neuropsych and SW  Financial Services: Private Insurance: [place]  Follow-up services arranged: Outpatient: [facility]-PT WILL CALL AT HOME TO SCHEDULE APPOINTMENT, DME: [facility], TRANSFER BOARD, 3 IN 1, TRACH SUPPLIES AND HUMIDIFIED AIR and Patient/Family has no preference for HH/DME agencies SWITCHED OUT 3 IN 1 WITH [facility]  Comments (or additional information):PT INSISTENT ON GOING HOME ALTHOUGH TEAM WOULD HAVE LIKED HIM TO STAY FOR REHAB. WIFE AND NIECE WERE HERE FOR EDUCATION AND FEEL WILL PROVIDE 24/7 IF PT WILL ALLOW IT. PT VERY STUBBORN AND DOES NOT USE THE BEST JUDGEMENT AND IS AT HIGH RISK TO BE RE-ADMITTED TO [place] DUE TO NON-COMPLIANCE AND WANTING TO SMOKE AND DRINK. WIFE FEELS HE DOES WHAT HE WANTS HE ALWAYS HAS.  Patient/Family verbalized understanding of follow-up arrangements: Yes  Individual responsible for coordination of the follow-up plan: GWENDOLYN-WIFE [phone]  Confirmed correct DME delivered: Elease Hashimoto 02/15/2020    Elease Hashimoto

## 2020-03-07 ENCOUNTER — Other Ambulatory Visit: Payer: Self-pay | Admitting: Physical Medicine and Rehabilitation

## 2020-03-12 ENCOUNTER — Ambulatory Visit: Payer: 59 | Attending: Physical Medicine and Rehabilitation | Admitting: Physical Therapy

## 2020-03-12 ENCOUNTER — Other Ambulatory Visit: Payer: Self-pay

## 2020-03-12 ENCOUNTER — Encounter: Payer: Self-pay | Admitting: Physical Therapy

## 2020-03-12 DIAGNOSIS — M25671 Stiffness of right ankle, not elsewhere classified: Secondary | ICD-10-CM | POA: Insufficient documentation

## 2020-03-12 DIAGNOSIS — R262 Difficulty in walking, not elsewhere classified: Secondary | ICD-10-CM | POA: Diagnosis present

## 2020-03-12 DIAGNOSIS — R600 Localized edema: Secondary | ICD-10-CM | POA: Diagnosis present

## 2020-03-12 DIAGNOSIS — M25571 Pain in right ankle and joints of right foot: Secondary | ICD-10-CM | POA: Insufficient documentation
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
## 2020-03-19 ENCOUNTER — Other Ambulatory Visit: Payer: Self-pay

## 2020-03-19 ENCOUNTER — Encounter: Payer: Self-pay | Admitting: Pulmonary Disease

## 2020-03-19 ENCOUNTER — Ambulatory Visit (INDEPENDENT_AMBULATORY_CARE_PROVIDER_SITE_OTHER): Payer: 59 | Admitting: Pulmonary Disease

## 2020-03-19 ENCOUNTER — Other Ambulatory Visit (HOSPITAL_COMMUNITY)
Admission: RE | Admit: 2020-03-19 | Discharge: 2020-03-19 | Disposition: A | Discharge status: Home / Self Care | Payer: 59 | Source: Ambulatory Visit | Attending: Pulmonary Disease | Admitting: Pulmonary Disease

## 2020-03-19 VITALS — BP 134/76 | HR 94 | Temp 98.2°F | Ht 69.0 in | Wt 271.0 lb

## 2020-03-19 DIAGNOSIS — R0609 Other forms of dyspnea: Secondary | ICD-10-CM

## 2020-03-19 DIAGNOSIS — R0600 Dyspnea, unspecified: Secondary | ICD-10-CM

## 2020-03-19 DIAGNOSIS — Z20822 Contact with and (suspected) exposure to covid-19: Secondary | ICD-10-CM | POA: Insufficient documentation

## 2020-03-19 DIAGNOSIS — Z01818 Encounter for other preprocedural examination: Secondary | ICD-10-CM | POA: Diagnosis not present

## 2020-03-19 DIAGNOSIS — G4733 Obstructive sleep apnea (adult) (pediatric): Secondary | ICD-10-CM

## 2020-03-19 LAB — SARS CORONAVIRUS 2 (TAT 6-24 HRS): SARS Coronavirus 2: NEGATIVE
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
## 2020-03-19 NOTE — Patient Instructions (Signed)
We will schedule you for a breathing study  We will schedule you for an in lab sleep study, as requested by you, we will schedule it for couple of months from now  I will send in a refill for your heart medicine  Call to make an appointment with your cardiologist  Call to make an appointment with your primary doctor as soon as possible  I will see you back after the breathing study and sleep study

## 2020-03-20 ENCOUNTER — Telehealth: Payer: Self-pay | Admitting: Pulmonary Disease

## 2020-03-20 ENCOUNTER — Other Ambulatory Visit: Payer: Self-pay

## 2020-03-20 ENCOUNTER — Encounter: Payer: Self-pay | Admitting: Physical Therapy

## 2020-03-20 ENCOUNTER — Ambulatory Visit: Payer: 59 | Admitting: Physical Therapy

## 2020-03-20 DIAGNOSIS — R600 Localized edema: Secondary | ICD-10-CM

## 2020-03-20 DIAGNOSIS — M25671 Stiffness of right ankle, not elsewhere classified: Secondary | ICD-10-CM

## 2020-03-20 DIAGNOSIS — M25571 Pain in right ankle and joints of right foot: Secondary | ICD-10-CM

## 2020-03-20 DIAGNOSIS — R262 Difficulty in walking, not elsewhere classified: Secondary | ICD-10-CM

## 2020-03-20 MED ORDER — AMIODARONE HCL 200 MG PO TABS
200.0000 mg | ORAL_TABLET | Freq: Every day | ORAL | 0 refills | Status: DC
Start: 2020-03-20 — End: 2020-03-23
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
## 2020-03-20 NOTE — Telephone Encounter (Signed)
Amiodarone 200 mg daily for 1 month no refills

## 2020-03-20 NOTE — Telephone Encounter (Signed)
Rx was refilled x 1 month only and I spoke with the pt and notified that this was done. Nothing further needed.

## 2020-03-20 NOTE — Telephone Encounter (Signed)
Spoke with pt  He is asking for refill on his heart medication- unsure of the name  AVS from 03/19/20 stated refill on heart med would be sent  Nothing sent  After reviewing [facility] looks like the medication needed is amiodarone? Dr Val Eagle, can you please advise what you wanted to refill for him? Thanks!

## 2020-03-21 ENCOUNTER — Ambulatory Visit (HOSPITAL_COMMUNITY): Admit: 2020-03-21 | Discharge: 2020-03-21 | Disposition: A | Discharge status: Home / Self Care | Payer: 59

## 2020-03-21 ENCOUNTER — Telehealth: Payer: Self-pay | Admitting: *Deleted

## 2020-03-21 DIAGNOSIS — J386 Stenosis of larynx: Secondary | ICD-10-CM

## 2020-03-21 DIAGNOSIS — R490 Dysphonia: Secondary | ICD-10-CM | POA: Insufficient documentation

## 2020-03-21 NOTE — Telephone Encounter (Signed)
-----   Message from Simonne Martinet, NP sent at 03/21/2020  3:31 PM EST ----- Regarding: consult request Hey guys   Need you to place a consult for this guy to ENT. I would like him to see either Cameron Ali Or Christia Reading.  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 Ideally if can be done before end of month would be great   Thanks  [address]
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
## 2020-03-22 ENCOUNTER — Encounter: Payer: Self-pay | Admitting: Physical Medicine & Rehabilitation

## 2020-03-22 ENCOUNTER — Encounter: Payer: 59 | Attending: Physical Medicine & Rehabilitation | Admitting: Physical Medicine & Rehabilitation

## 2020-03-22 ENCOUNTER — Other Ambulatory Visit: Payer: Self-pay

## 2020-03-22 VITALS — BP 171/99 | HR 89 | Temp 97.9°F | Ht 69.0 in | Wt 265.0 lb

## 2020-03-22 DIAGNOSIS — Z720 Tobacco use: Secondary | ICD-10-CM

## 2020-03-22 DIAGNOSIS — R490 Dysphonia: Secondary | ICD-10-CM

## 2020-03-22 DIAGNOSIS — R0689 Other abnormalities of breathing: Secondary | ICD-10-CM | POA: Diagnosis present

## 2020-03-22 DIAGNOSIS — R269 Unspecified abnormalities of gait and mobility: Secondary | ICD-10-CM

## 2020-03-22 DIAGNOSIS — T07XXXA Unspecified multiple injuries, initial encounter: Secondary | ICD-10-CM

## 2020-03-22 DIAGNOSIS — I4891 Unspecified atrial fibrillation: Secondary | ICD-10-CM
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
## 2020-03-23 ENCOUNTER — Encounter: Payer: 59 | Admitting: Physical Therapy

## 2020-03-23 ENCOUNTER — Encounter: Payer: Self-pay | Admitting: Cardiology

## 2020-03-23 ENCOUNTER — Ambulatory Visit (INDEPENDENT_AMBULATORY_CARE_PROVIDER_SITE_OTHER): Payer: 59 | Admitting: Cardiology

## 2020-03-23 VITALS — BP 138/80 | HR 101 | Ht 69.0 in | Wt 269.6 lb

## 2020-03-23 DIAGNOSIS — Z720 Tobacco use: Secondary | ICD-10-CM

## 2020-03-23 DIAGNOSIS — I480 Paroxysmal atrial fibrillation: Secondary | ICD-10-CM | POA: Diagnosis not present

## 2020-03-23 DIAGNOSIS — T07XXXA Unspecified multiple injuries, initial encounter: Secondary | ICD-10-CM

## 2020-03-23 MED ORDER — DILTIAZEM HCL ER COATED BEADS 180 MG PO CP24: 180.0000 mg | ORAL_CAPSULE | Freq: Every day | ORAL | 3 refills | Status: DC
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
## 2020-03-23 NOTE — Patient Instructions (Signed)
Medication Instructions:  STOP- Amiodarone  STOP- Amlodipine START- Diltiazem CD 180 mg by mouth daily  *If you need a refill on your cardiac medications before your next appointment, please call your pharmacy*   Lab Work: None Ordered   Testing/Procedures: None ordered   Follow-Up: At [facility], you and your health needs are our priority.  As part of our continuing mission to provide you with exceptional heart care, we have created designated Provider Care Teams.  These Care Teams include your primary Cardiologist (physician) and Advanced Practice Providers (APPs -  Physician Assistants and Nurse Practitioners) who all work together to provide you with the care you need, when you need it.  We recommend signing up for the patient portal called "MyChart".  Sign up information is provided on this After Visit Summary.  MyChart is used to connect with patients for Virtual Visits (Telemedicine).  Patients are able to view lab/test results, encounter notes, upcoming appointments, etc.  Non-urgent messages can be sent to your provider as well.   To learn more about what you can do with MyChart, go to [email or web address].    Your next appointment:   6 week(s)  The format for your next appointment:   In Person  Provider:   You may see Parke Poisson, MD or one of the following Advanced Practice Providers on your designated Care Team:    Theodore Demark, PA-C  Joni Reining, DNP, ANP

## 2020-03-26 ENCOUNTER — Ambulatory Visit: Payer: 59 | Attending: Physical Medicine and Rehabilitation | Admitting: Physical Therapy

## 2020-03-26 ENCOUNTER — Other Ambulatory Visit: Payer: Self-pay

## 2020-03-26 ENCOUNTER — Encounter: Payer: Self-pay | Admitting: Physical Therapy

## 2020-03-26 DIAGNOSIS — M25571 Pain in right ankle and joints of right foot: Secondary | ICD-10-CM

## 2020-03-26 DIAGNOSIS — R262 Difficulty in walking, not elsewhere classified: Secondary | ICD-10-CM | POA: Diagnosis present

## 2020-03-26 DIAGNOSIS — R600 Localized edema: Secondary | ICD-10-CM | POA: Diagnosis present

## 2020-03-26 DIAGNOSIS — M25671 Stiffness of right ankle, not elsewhere classified: Secondary | ICD-10-CM
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
## 2020-03-30 ENCOUNTER — Other Ambulatory Visit: Payer: Self-pay

## 2020-03-30 ENCOUNTER — Ambulatory Visit: Payer: 59 | Admitting: Physical Therapy

## 2020-03-30 ENCOUNTER — Encounter: Payer: Self-pay | Admitting: Physical Therapy

## 2020-03-30 DIAGNOSIS — R600 Localized edema: Secondary | ICD-10-CM

## 2020-03-30 DIAGNOSIS — M25671 Stiffness of right ankle, not elsewhere classified: Secondary | ICD-10-CM | POA: Diagnosis not present

## 2020-03-30 DIAGNOSIS — R262 Difficulty in walking, not elsewhere classified: Secondary | ICD-10-CM

## 2020-03-30 DIAGNOSIS — M25571 Pain in right ankle and joints of right foot: Secondary | ICD-10-CM
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
## 2020-04-03 ENCOUNTER — Other Ambulatory Visit: Payer: Self-pay

## 2020-04-03 ENCOUNTER — Ambulatory Visit: Payer: 59 | Admitting: *Deleted

## 2020-04-03 DIAGNOSIS — R262 Difficulty in walking, not elsewhere classified: Secondary | ICD-10-CM

## 2020-04-03 DIAGNOSIS — M25671 Stiffness of right ankle, not elsewhere classified: Secondary | ICD-10-CM

## 2020-04-03 DIAGNOSIS — M25571 Pain in right ankle and joints of right foot: Secondary | ICD-10-CM

## 2020-04-03 DIAGNOSIS — R600 Localized edema: Secondary | ICD-10-CM
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
## 2020-04-06 ENCOUNTER — Other Ambulatory Visit: Payer: Self-pay

## 2020-04-06 ENCOUNTER — Encounter: Payer: Self-pay | Admitting: Physical Therapy

## 2020-04-06 ENCOUNTER — Ambulatory Visit: Payer: 59 | Admitting: Physical Therapy

## 2020-04-06 DIAGNOSIS — M25671 Stiffness of right ankle, not elsewhere classified: Secondary | ICD-10-CM | POA: Diagnosis not present

## 2020-04-06 DIAGNOSIS — M25571 Pain in right ankle and joints of right foot: Secondary | ICD-10-CM

## 2020-04-06 DIAGNOSIS — R262 Difficulty in walking, not elsewhere classified: Secondary | ICD-10-CM

## 2020-04-06 DIAGNOSIS — R600 Localized edema: Secondary | ICD-10-CM
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
## 2020-04-09 ENCOUNTER — Inpatient Hospital Stay: Payer: 59 | Admitting: Physical Medicine & Rehabilitation

## 2020-04-11 ENCOUNTER — Other Ambulatory Visit: Payer: Self-pay

## 2020-04-11 ENCOUNTER — Ambulatory Visit: Payer: 59 | Admitting: Physical Therapy

## 2020-04-11 ENCOUNTER — Encounter: Payer: Self-pay | Admitting: Physical Therapy

## 2020-04-11 ENCOUNTER — Other Ambulatory Visit: Payer: Self-pay | Admitting: Orthopedic Surgery

## 2020-04-11 DIAGNOSIS — S92111D Displaced fracture of neck of right talus, subsequent encounter for fracture with routine healing: Secondary | ICD-10-CM

## 2020-04-11 DIAGNOSIS — R262 Difficulty in walking, not elsewhere classified: Secondary | ICD-10-CM

## 2020-04-11 DIAGNOSIS — R600 Localized edema: Secondary | ICD-10-CM

## 2020-04-11 DIAGNOSIS — S92114D Nondisplaced fracture of neck of right talus, subsequent encounter for fracture with routine healing: Secondary | ICD-10-CM

## 2020-04-11 DIAGNOSIS — M25571 Pain in right ankle and joints of right foot: Secondary | ICD-10-CM

## 2020-04-11 DIAGNOSIS — M25671 Stiffness of right ankle, not elsewhere classified: Secondary | ICD-10-CM
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
## 2020-04-12 ENCOUNTER — Other Ambulatory Visit: Payer: Self-pay | Admitting: Pulmonary Disease

## 2020-04-17 ENCOUNTER — Ambulatory Visit: Payer: 59 | Admitting: Physical Therapy

## 2020-04-17 ENCOUNTER — Encounter: Payer: Self-pay | Admitting: Physical Therapy

## 2020-04-17 ENCOUNTER — Other Ambulatory Visit: Payer: Self-pay

## 2020-04-17 DIAGNOSIS — M25671 Stiffness of right ankle, not elsewhere classified: Secondary | ICD-10-CM | POA: Diagnosis not present

## 2020-04-17 DIAGNOSIS — M25571 Pain in right ankle and joints of right foot: Secondary | ICD-10-CM

## 2020-04-17 DIAGNOSIS — R600 Localized edema: Secondary | ICD-10-CM

## 2020-04-17 DIAGNOSIS — R262 Difficulty in walking, not elsewhere classified: Secondary | ICD-10-CM
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
## 2020-04-18 ENCOUNTER — Ambulatory Visit
Admission: RE | Admit: 2020-04-18 | Discharge: 2020-04-18 | Disposition: A | Discharge status: Home / Self Care | Payer: 59 | Source: Ambulatory Visit | Attending: Orthopedic Surgery | Admitting: Orthopedic Surgery

## 2020-04-18 DIAGNOSIS — S92111D Displaced fracture of neck of right talus, subsequent encounter for fracture with routine healing: Secondary | ICD-10-CM

## 2020-04-23 ENCOUNTER — Encounter: Payer: Self-pay | Admitting: Physical Therapy

## 2020-04-23 ENCOUNTER — Ambulatory Visit: Payer: 59 | Attending: Physical Medicine and Rehabilitation | Admitting: Physical Therapy

## 2020-04-23 ENCOUNTER — Other Ambulatory Visit: Payer: Self-pay

## 2020-04-23 DIAGNOSIS — Z09 Encounter for follow-up examination after completed treatment for conditions other than malignant neoplasm: Secondary | ICD-10-CM | POA: Diagnosis present

## 2020-04-23 DIAGNOSIS — R600 Localized edema: Secondary | ICD-10-CM | POA: Diagnosis present

## 2020-04-23 DIAGNOSIS — M25671 Stiffness of right ankle, not elsewhere classified: Secondary | ICD-10-CM | POA: Diagnosis not present

## 2020-04-23 DIAGNOSIS — R262 Difficulty in walking, not elsewhere classified: Secondary | ICD-10-CM | POA: Insufficient documentation

## 2020-04-23 DIAGNOSIS — M25571 Pain in right ankle and joints of right foot: Secondary | ICD-10-CM | POA: Insufficient documentation
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
## 2020-04-27 ENCOUNTER — Ambulatory Visit: Payer: 59 | Admitting: Physical Therapy

## 2020-04-27 ENCOUNTER — Other Ambulatory Visit: Payer: Self-pay

## 2020-04-27 ENCOUNTER — Encounter: Payer: Self-pay | Admitting: Physical Therapy

## 2020-04-27 DIAGNOSIS — M25671 Stiffness of right ankle, not elsewhere classified: Secondary | ICD-10-CM | POA: Diagnosis not present

## 2020-04-27 DIAGNOSIS — R262 Difficulty in walking, not elsewhere classified: Secondary | ICD-10-CM

## 2020-04-27 DIAGNOSIS — R600 Localized edema: Secondary | ICD-10-CM

## 2020-04-27 DIAGNOSIS — M25571 Pain in right ankle and joints of right foot: Secondary | ICD-10-CM
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
## 2020-04-30 ENCOUNTER — Ambulatory Visit: Payer: 59 | Admitting: Physical Therapy

## 2020-04-30 ENCOUNTER — Other Ambulatory Visit: Payer: Self-pay

## 2020-04-30 ENCOUNTER — Other Ambulatory Visit (HOSPITAL_COMMUNITY)
Admission: RE | Admit: 2020-04-30 | Discharge: 2020-04-30 | Disposition: A | Discharge status: Home / Self Care | Payer: 59 | Source: Ambulatory Visit | Attending: Pulmonary Disease | Admitting: Pulmonary Disease

## 2020-04-30 DIAGNOSIS — Z01812 Encounter for preprocedural laboratory examination: Secondary | ICD-10-CM | POA: Diagnosis present

## 2020-04-30 DIAGNOSIS — M25671 Stiffness of right ankle, not elsewhere classified: Secondary | ICD-10-CM | POA: Diagnosis not present

## 2020-04-30 DIAGNOSIS — R262 Difficulty in walking, not elsewhere classified: Secondary | ICD-10-CM

## 2020-04-30 DIAGNOSIS — Z20822 Contact with and (suspected) exposure to covid-19: Secondary | ICD-10-CM | POA: Insufficient documentation

## 2020-04-30 DIAGNOSIS — R600 Localized edema: Secondary | ICD-10-CM

## 2020-04-30 DIAGNOSIS — M25571 Pain in right ankle and joints of right foot: Secondary | ICD-10-CM

## 2020-04-30 LAB — SARS CORONAVIRUS 2 (TAT 6-24 HRS): SARS Coronavirus 2: NEGATIVE
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
## 2020-05-04 ENCOUNTER — Ambulatory Visit (INDEPENDENT_AMBULATORY_CARE_PROVIDER_SITE_OTHER): Payer: 59 | Admitting: Pulmonary Disease

## 2020-05-04 ENCOUNTER — Other Ambulatory Visit: Payer: Self-pay

## 2020-05-04 ENCOUNTER — Ambulatory Visit: Payer: 59 | Admitting: Physical Therapy

## 2020-05-04 ENCOUNTER — Other Ambulatory Visit: Payer: Self-pay | Admitting: *Deleted

## 2020-05-04 ENCOUNTER — Encounter: Payer: Self-pay | Admitting: Physical Therapy

## 2020-05-04 DIAGNOSIS — M25671 Stiffness of right ankle, not elsewhere classified: Secondary | ICD-10-CM | POA: Diagnosis not present

## 2020-05-04 DIAGNOSIS — R0609 Other forms of dyspnea: Secondary | ICD-10-CM

## 2020-05-04 DIAGNOSIS — R0600 Dyspnea, unspecified: Secondary | ICD-10-CM

## 2020-05-04 DIAGNOSIS — Z09 Encounter for follow-up examination after completed treatment for conditions other than malignant neoplasm: Secondary | ICD-10-CM

## 2020-05-04 DIAGNOSIS — M25571 Pain in right ankle and joints of right foot: Secondary | ICD-10-CM

## 2020-05-04 DIAGNOSIS — R262 Difficulty in walking, not elsewhere classified: Secondary | ICD-10-CM

## 2020-05-04 DIAGNOSIS — R600 Localized edema: Secondary | ICD-10-CM
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
## 2020-05-04 NOTE — Progress Notes (Signed)
Full PFT performed today. °

## 2020-05-08 LAB — PULMONARY FUNCTION TEST
DL/VA % pred: 123 %
DL/VA: 5.18 ml/min/mmHg/L
DLCO cor % pred: 96 %
DLCO cor: 25.43 ml/min/mmHg
DLCO unc % pred: 96 %
DLCO unc: 25.43 ml/min/mmHg
FEF 25-75 Post: 2.65 L/sec
FEF 25-75 Pre: 1.8 L/sec
FEF2575-%Change-Post: 47 %
FEF2575-%Pred-Post: 95 %
FEF2575-%Pred-Pre: 64 %
FEV1-%Change-Post: 9 %
FEV1-%Pred-Post: 60 %
FEV1-%Pred-Pre: 55 %
FEV1-Post: 2.07 L
FEV1-Pre: 1.89 L
FEV1FVC-%Change-Post: 3 %
FEV1FVC-%Pred-Pre: 104 %
FEV6-%Change-Post: 7 %
FEV6-%Pred-Post: 58 %
FEV6-%Pred-Pre: 54 %
FEV6-Post: 2.54 L
FEV6-Pre: 2.36 L
FEV6FVC-%Pred-Post: 105 %
FEV6FVC-%Pred-Pre: 105 %
FVC-%Change-Post: 5 %
FVC-%Pred-Post: 55 %
FVC-%Pred-Pre: 52 %
FVC-Post: 2.54 L
FVC-Pre: 2.4 L
Post FEV1/FVC ratio: 82 %
Post FEV6/FVC ratio: 100 %
Pre FEV1/FVC ratio: 79 %
Pre FEV6/FVC Ratio: 100 %
RV % pred: 152 %
RV: 3.39 L
TLC % pred: 88 %
TLC: 6 L

## 2020-05-09 ENCOUNTER — Encounter (HOSPITAL_COMMUNITY): Payer: Self-pay

## 2020-05-09 ENCOUNTER — Ambulatory Visit (INDEPENDENT_AMBULATORY_CARE_PROVIDER_SITE_OTHER): Payer: 59 | Admitting: Internal Medicine

## 2020-05-09 ENCOUNTER — Ambulatory Visit (HOSPITAL_COMMUNITY)
Admission: RE | Admit: 2020-05-09 | Discharge: 2020-05-09 | Disposition: A | Discharge status: Home / Self Care | Payer: 59 | Source: Ambulatory Visit | Attending: Internal Medicine | Admitting: Internal Medicine

## 2020-05-09 ENCOUNTER — Other Ambulatory Visit: Payer: Self-pay

## 2020-05-09 VITALS — BP 142/88 | HR 88 | Ht 70.0 in | Wt 276.8 lb

## 2020-05-09 DIAGNOSIS — Z86718 Personal history of other venous thrombosis and embolism: Secondary | ICD-10-CM

## 2020-05-09 DIAGNOSIS — R600 Localized edema: Secondary | ICD-10-CM

## 2020-05-09 DIAGNOSIS — Z720 Tobacco use: Secondary | ICD-10-CM

## 2020-05-09 DIAGNOSIS — T07XXXD Unspecified multiple injuries, subsequent encounter: Secondary | ICD-10-CM

## 2020-05-09 DIAGNOSIS — I480 Paroxysmal atrial fibrillation: Secondary | ICD-10-CM | POA: Diagnosis not present

## 2020-05-09 DIAGNOSIS — T07XXXA Unspecified multiple injuries, initial encounter: Secondary | ICD-10-CM

## 2020-05-09 MED ORDER — APIXABAN (ELIQUIS) VTE STARTER PACK (10MG AND 5MG): ORAL_TABLET | ORAL | 0 refills | Status: DC

## 2020-05-09 MED ORDER — FUROSEMIDE 40 MG PO TABS: 40.0000 mg | ORAL_TABLET | ORAL | 3 refills | Status: DC | PRN

## 2020-05-09 MED ORDER — DILTIAZEM HCL ER COATED BEADS 240 MG PO CP24: 240.0000 mg | ORAL_CAPSULE | Freq: Every day | ORAL | 3 refills | Status: AC

## 2020-05-09 NOTE — Patient Instructions (Addendum)
Medication Instructions:  INCREASE DILTIAZEM TO 240mg  DAILY- (1 Tablet Daily)  START: LASIX (FUROSEMIDE)- 40mg - AS NEEDED FOR SWELLING OR WEIGHT GAIN- MAY TAKE 1 TABLET DAILY AS NEEDED   PLEASE STOP ASPIRIN   PLEASE START ELIQUIS- PLEASE FOLLOW INSTRUCTIONS ON STARTER PACK OF ELIQUIS  *If you need a refill on your cardiac medications before your next appointment, please call your pharmacy*  Lab Work: CBC and CMP TODAY  If you have labs (blood work) drawn today and your tests are completely normal, you will receive your results only by: MyChart Message (if you have MyChart) OR . A paper copy in the mail If you have any lab test that is abnormal or we need to change your treatment, we will call you to review the results.  Testing/Procedures: Lower Extremity Doppler (DVT Study)   Follow-Up: At [facility], you and your health needs are our priority.  As part of our continuing mission to provide you with exceptional heart care, we have created designated Provider Care Teams.  These Care Teams include your primary Cardiologist (physician) and Advanced Practice Providers (APPs -  Physician Assistants and Nurse Practitioners) who all work together to provide you with the care you need, when you need it.  Your next appointment:   4-6 week(s)  The format for your next appointment:   In Person  Provider:   You may see Marland Kitchen, MD or one of the following Advanced Practice Providers on your designated Care Team:    [facility], PA-C  Parke Poisson, DNP, ANP  Theodore Demark PA-C  Other Instructions PLEASE WEAR YOUR COMPRESSION STOCKINGS   PATIENT HAS BEEN ADVISED TO ABSTAIN FROM ALCOHOL USE.
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
## 2020-05-09 NOTE — Progress Notes (Signed)
Bilateral lower extremity venous duplex test completed. Evidence of DVT in the right distal femoral vein and popliteal vein. See results under Chart Review-CV proc. Gave results to Dr. Jacques Navy and patient given further instructions. Final report to follow.

## 2020-05-10 LAB — CBC
Hematocrit: 50.1 % (ref 37.5–51.0)
Hemoglobin: 17.2 g/dL (ref 13.0–17.7)
MCH: 31.1 pg (ref 26.6–33.0)
MCHC: 34.3 g/dL (ref 31.5–35.7)
MCV: 91 fL (ref 79–97)
Platelets: 241 10*3/uL (ref 150–450)
RBC: 5.53 x10E6/uL (ref 4.14–5.80)
RDW: 12.9 % (ref 11.6–15.4)
WBC: 7.6 10*3/uL (ref 3.4–10.8)

## 2020-05-10 LAB — COMPREHENSIVE METABOLIC PANEL
ALT: 13 IU/L (ref 0–44)
AST: 16 IU/L (ref 0–40)
Albumin/Globulin Ratio: 1.5 (ref 1.2–2.2)
Albumin: 4.3 g/dL (ref 3.8–4.8)
Alkaline Phosphatase: 141 IU/L — ABNORMAL HIGH (ref 44–121)
BUN/Creatinine Ratio: 15 (ref 10–24)
BUN: 15 mg/dL (ref 8–27)
Bilirubin Total: 0.4 mg/dL (ref 0.0–1.2)
CO2: 23 mmol/L (ref 20–29)
Calcium: 10.2 mg/dL (ref 8.6–10.2)
Chloride: 103 mmol/L (ref 96–106)
Creatinine, Ser: 1 mg/dL (ref 0.76–1.27)
GFR calc Af Amer: 93 mL/min/{1.73_m2} (ref >59)
GFR calc non Af Amer: 80 mL/min/{1.73_m2} (ref >59)
Globulin, Total: 2.8 g/dL (ref 1.5–4.5)
Glucose: 91 mg/dL (ref 65–99)
Potassium: 4.6 mmol/L (ref 3.5–5.2)
Sodium: 143 mmol/L (ref 134–144)
Total Protein: 7.1 g/dL (ref 6.0–8.5)

## 2020-05-11 ENCOUNTER — Ambulatory Visit: Payer: 59 | Admitting: *Deleted

## 2020-05-11 ENCOUNTER — Other Ambulatory Visit: Payer: Self-pay

## 2020-05-11 DIAGNOSIS — M25571 Pain in right ankle and joints of right foot: Secondary | ICD-10-CM

## 2020-05-11 DIAGNOSIS — M25671 Stiffness of right ankle, not elsewhere classified: Secondary | ICD-10-CM | POA: Diagnosis not present

## 2020-05-11 DIAGNOSIS — R262 Difficulty in walking, not elsewhere classified: Secondary | ICD-10-CM

## 2020-05-11 DIAGNOSIS — R600 Localized edema: Secondary | ICD-10-CM
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
## 2020-05-15 ENCOUNTER — Ambulatory Visit: Payer: 59 | Admitting: Physical Therapy

## 2020-05-15 ENCOUNTER — Encounter: Payer: Self-pay | Admitting: Physical Therapy

## 2020-05-15 ENCOUNTER — Other Ambulatory Visit: Payer: Self-pay

## 2020-05-15 DIAGNOSIS — M25671 Stiffness of right ankle, not elsewhere classified: Secondary | ICD-10-CM | POA: Diagnosis not present

## 2020-05-15 DIAGNOSIS — R600 Localized edema: Secondary | ICD-10-CM

## 2020-05-15 DIAGNOSIS — M25571 Pain in right ankle and joints of right foot: Secondary | ICD-10-CM

## 2020-05-15 DIAGNOSIS — R262 Difficulty in walking, not elsewhere classified: Secondary | ICD-10-CM
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
## 2020-05-17 ENCOUNTER — Telehealth: Payer: Self-pay | Admitting: Pulmonary Disease

## 2020-05-17 NOTE — Telephone Encounter (Signed)
Spoke with patient. He stated that he was requesting the results of his PFT from 05/04/20.   AO, can you please advise? Thanks!

## 2020-05-18 ENCOUNTER — Other Ambulatory Visit: Payer: Self-pay

## 2020-05-18 ENCOUNTER — Ambulatory Visit: Payer: 59 | Admitting: Physical Therapy

## 2020-05-21 ENCOUNTER — Other Ambulatory Visit: Payer: Self-pay

## 2020-05-21 ENCOUNTER — Encounter: Payer: Self-pay | Admitting: Physical Therapy

## 2020-05-21 ENCOUNTER — Ambulatory Visit: Payer: 59 | Admitting: Physical Therapy

## 2020-05-21 DIAGNOSIS — R600 Localized edema: Secondary | ICD-10-CM

## 2020-05-21 DIAGNOSIS — M25671 Stiffness of right ankle, not elsewhere classified: Secondary | ICD-10-CM | POA: Diagnosis not present

## 2020-05-21 DIAGNOSIS — M25571 Pain in right ankle and joints of right foot: Secondary | ICD-10-CM

## 2020-05-21 DIAGNOSIS — R262 Difficulty in walking, not elsewhere classified: Secondary | ICD-10-CM

## 2020-05-21 NOTE — Telephone Encounter (Signed)
Attempted to call pt but unable to reach. Left message for him to return call. °

## 2020-05-21 NOTE — Telephone Encounter (Signed)
Inform patient  PFT shows moderate obstructive lung disease  Continue current care

## 2020-05-21 NOTE — Telephone Encounter (Signed)
Called and spoke with pt letting him know the results of the PFT and stated to him that AO said to continue with current plan of care. Pt stated that he is not scheduled to have sleep study done until March 2022 and wanted to know if it would be okay for him to go ahead and schedule an appt prior to that or if he should still wait until after the sleep study to be seen.  Pt said he has been having a little more problems with his breathing. States that he is using his Symbicort inhaler twice daily as prescribed and has been using his albuterol inhaler at least twice a day and does not feel like he is getting much relief.  Dr. Val Eagle, please advise.

## 2020-05-21 NOTE — Telephone Encounter (Signed)
Patient is returning phone call. Patient phone number is [phone].
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
## 2020-05-23 ENCOUNTER — Other Ambulatory Visit: Payer: Self-pay | Admitting: Pulmonary Disease

## 2020-05-23 MED ORDER — BREZTRI AEROSPHERE 160-9-4.8 MCG/ACT IN AERO: 2.0000 | INHALATION_SPRAY | Freq: Two times a day (BID) | RESPIRATORY_TRACT | 3 refills | Status: DC

## 2020-05-23 NOTE — Telephone Encounter (Signed)
Called spoke with patient. He is going to use the [facility] and call us back if it isn't working for an appointment. Looks like refill was already sent in on the [place].   Nothing further needed at this time.

## 2020-05-23 NOTE — Telephone Encounter (Signed)
I do not mind seeing him sooner  One option we have, is to change him from Symbicort since he has been using it regularly and not feeling any better  Roberto Lynch will be a good option  He can try this for a while and if not feeling better, can be seen in the office

## 2020-05-23 NOTE — Progress Notes (Signed)
Switch from Symbicort to [facility]

## 2020-05-24 ENCOUNTER — Encounter: Payer: 59 | Admitting: Physical Medicine & Rehabilitation

## 2020-05-25 ENCOUNTER — Encounter: Payer: 59 | Admitting: Physical Therapy

## 2020-05-28 ENCOUNTER — Ambulatory Visit: Payer: 59 | Admitting: Physical Therapy

## 2020-06-01 ENCOUNTER — Other Ambulatory Visit: Payer: Self-pay

## 2020-06-01 ENCOUNTER — Ambulatory Visit: Payer: 59 | Attending: Physical Medicine and Rehabilitation | Admitting: Physical Therapy

## 2020-06-01 ENCOUNTER — Encounter: Payer: Self-pay | Admitting: Physical Therapy

## 2020-06-01 DIAGNOSIS — R600 Localized edema: Secondary | ICD-10-CM | POA: Insufficient documentation

## 2020-06-01 DIAGNOSIS — M25571 Pain in right ankle and joints of right foot: Secondary | ICD-10-CM | POA: Insufficient documentation

## 2020-06-01 DIAGNOSIS — R262 Difficulty in walking, not elsewhere classified: Secondary | ICD-10-CM | POA: Insufficient documentation

## 2020-06-01 DIAGNOSIS — M25671 Stiffness of right ankle, not elsewhere classified: Secondary | ICD-10-CM | POA: Insufficient documentation

## 2020-06-04 ENCOUNTER — Ambulatory Visit: Payer: 59 | Admitting: Physical Therapy

## 2020-06-04 ENCOUNTER — Other Ambulatory Visit: Payer: Self-pay

## 2020-06-04 ENCOUNTER — Encounter: Payer: Self-pay | Admitting: Physical Therapy

## 2020-06-04 DIAGNOSIS — M25571 Pain in right ankle and joints of right foot: Secondary | ICD-10-CM

## 2020-06-04 DIAGNOSIS — M25671 Stiffness of right ankle, not elsewhere classified: Secondary | ICD-10-CM

## 2020-06-04 DIAGNOSIS — R600 Localized edema: Secondary | ICD-10-CM

## 2020-06-04 DIAGNOSIS — R262 Difficulty in walking, not elsewhere classified: Secondary | ICD-10-CM
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
## 2020-06-05 ENCOUNTER — Telehealth: Payer: Self-pay | Admitting: Internal Medicine

## 2020-06-05 MED ORDER — APIXABAN 5 MG PO TABS: 5.0000 mg | ORAL_TABLET | Freq: Two times a day (BID) | ORAL | 1 refills | Status: DC

## 2020-06-05 NOTE — Telephone Encounter (Signed)
Returned call to patient. Patient states he is almost out of his Starter pack of Eliquis. Patient was positive for DVT at 1/19 appointment with Dr. Jacques Navy. Patient started on Eliquis 10mg  twice daily for 7 days and on day 8 switch to 5mg  twice daily.   Order has been placed for Eliquis 5mg  twice daily. Patient requested 30 day supply.  Advised patient that this can be picked up at his pharmacy.   Patient wanted to know when he could stop the Eliquis. Advised patient that he has a follow up appointment with Dr. on 3/9 and he can discuss with her at that appointment. Patient verbalized understanding.

## 2020-06-05 NOTE — Telephone Encounter (Signed)
Pt c/o medication issue:  1. Name of Medication: APIXABAN (ELIQUIS) VTE STARTER PACK (10MG  AND 5MG )  2. How are you currently taking this medication (dosage and times per day)? As prescribed   3. Are you having a reaction (difficulty breathing--STAT)? No   4. What is your medication issue? Roberto Lynch is calling stating he is almost out of the starter pack of Eliquis and is requesting a call on where Dr. wants to go from here. Please advise.
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
## 2020-06-07 ENCOUNTER — Other Ambulatory Visit: Payer: Self-pay

## 2020-06-07 ENCOUNTER — Telehealth: Payer: Self-pay

## 2020-06-07 NOTE — Telephone Encounter (Addendum)
Eliquis PA started through covermymeds and the following message was received:  Gerald Kuehl (Key: [id number]) Rx #: [phone] Eliquis 5MG  tablets  Form: [employer] Electronic PA Form [phone] NCPDP) Determination: This request has been approved using information available on the patient's profile.  Case Id: ([id number]; Status: Approved;Review Type:Prior Auth Coverage Start Date:05/08/2020;Coverage End Date:06/07/2021  I have notified [facility] in [place], [place] of this approval.

## 2020-06-08 ENCOUNTER — Encounter: Payer: Self-pay | Admitting: Physical Therapy

## 2020-06-08 ENCOUNTER — Ambulatory Visit: Payer: 59 | Admitting: Physical Therapy

## 2020-06-08 ENCOUNTER — Other Ambulatory Visit: Payer: Self-pay

## 2020-06-08 DIAGNOSIS — R262 Difficulty in walking, not elsewhere classified: Secondary | ICD-10-CM

## 2020-06-08 DIAGNOSIS — R600 Localized edema: Secondary | ICD-10-CM

## 2020-06-08 DIAGNOSIS — M25671 Stiffness of right ankle, not elsewhere classified: Secondary | ICD-10-CM | POA: Diagnosis not present

## 2020-06-08 DIAGNOSIS — M25571 Pain in right ankle and joints of right foot: Secondary | ICD-10-CM
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
## 2020-06-11 ENCOUNTER — Ambulatory Visit: Payer: 59 | Admitting: Physical Therapy

## 2020-06-11 ENCOUNTER — Other Ambulatory Visit: Payer: Self-pay

## 2020-06-11 ENCOUNTER — Encounter: Payer: Self-pay | Admitting: Physical Therapy

## 2020-06-11 DIAGNOSIS — R262 Difficulty in walking, not elsewhere classified: Secondary | ICD-10-CM

## 2020-06-11 DIAGNOSIS — M25671 Stiffness of right ankle, not elsewhere classified: Secondary | ICD-10-CM

## 2020-06-11 DIAGNOSIS — R600 Localized edema: Secondary | ICD-10-CM

## 2020-06-11 DIAGNOSIS — M25571 Pain in right ankle and joints of right foot: Secondary | ICD-10-CM
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
## 2020-06-14 ENCOUNTER — Encounter: Payer: 59 | Admitting: Physical Therapy

## 2020-06-18 ENCOUNTER — Encounter: Payer: Self-pay | Admitting: Physical Therapy

## 2020-06-18 ENCOUNTER — Other Ambulatory Visit: Payer: Self-pay

## 2020-06-18 ENCOUNTER — Ambulatory Visit: Payer: 59 | Admitting: Physical Therapy

## 2020-06-18 DIAGNOSIS — M25571 Pain in right ankle and joints of right foot: Secondary | ICD-10-CM

## 2020-06-18 DIAGNOSIS — R600 Localized edema: Secondary | ICD-10-CM

## 2020-06-18 DIAGNOSIS — M25671 Stiffness of right ankle, not elsewhere classified: Secondary | ICD-10-CM | POA: Diagnosis not present

## 2020-06-18 DIAGNOSIS — R262 Difficulty in walking, not elsewhere classified: Secondary | ICD-10-CM
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
## 2020-06-22 ENCOUNTER — Encounter: Payer: 59 | Admitting: Physical Therapy

## 2020-06-25 ENCOUNTER — Other Ambulatory Visit: Payer: Self-pay

## 2020-06-25 ENCOUNTER — Encounter: Payer: Self-pay | Admitting: Physical Medicine & Rehabilitation

## 2020-06-25 ENCOUNTER — Encounter: Payer: 59 | Attending: Physical Medicine & Rehabilitation | Admitting: Physical Medicine & Rehabilitation

## 2020-06-25 VITALS — BP 125/86 | HR 79 | Temp 98.8°F | Ht 70.0 in | Wt 279.2 lb

## 2020-06-25 DIAGNOSIS — T07XXXA Unspecified multiple injuries, initial encounter: Secondary | ICD-10-CM | POA: Diagnosis not present

## 2020-06-25 DIAGNOSIS — Z720 Tobacco use: Secondary | ICD-10-CM | POA: Diagnosis present

## 2020-06-25 DIAGNOSIS — R490 Dysphonia: Secondary | ICD-10-CM | POA: Diagnosis not present

## 2020-06-25 DIAGNOSIS — R269 Unspecified abnormalities of gait and mobility: Secondary | ICD-10-CM | POA: Diagnosis present
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
## 2020-06-27 ENCOUNTER — Other Ambulatory Visit: Payer: Self-pay

## 2020-06-27 ENCOUNTER — Encounter: Payer: Self-pay | Admitting: Internal Medicine

## 2020-06-27 ENCOUNTER — Ambulatory Visit (INDEPENDENT_AMBULATORY_CARE_PROVIDER_SITE_OTHER): Payer: 59 | Admitting: Internal Medicine

## 2020-06-27 VITALS — BP 140/90 | HR 75 | Ht 69.0 in | Wt 277.0 lb

## 2020-06-27 DIAGNOSIS — R072 Precordial pain: Secondary | ICD-10-CM | POA: Diagnosis not present

## 2020-06-27 DIAGNOSIS — R600 Localized edema: Secondary | ICD-10-CM | POA: Diagnosis not present

## 2020-06-27 DIAGNOSIS — I4819 Other persistent atrial fibrillation: Secondary | ICD-10-CM | POA: Diagnosis not present

## 2020-06-27 DIAGNOSIS — R0689 Other abnormalities of breathing: Secondary | ICD-10-CM

## 2020-06-27 DIAGNOSIS — R0600 Dyspnea, unspecified: Secondary | ICD-10-CM

## 2020-06-27 DIAGNOSIS — Z86718 Personal history of other venous thrombosis and embolism: Secondary | ICD-10-CM

## 2020-06-27 DIAGNOSIS — I480 Paroxysmal atrial fibrillation: Secondary | ICD-10-CM

## 2020-06-27 NOTE — Patient Instructions (Signed)
Medication Instructions:  No Changes In Medications at this time.  *If you need a refill on your cardiac medications before your next appointment, please call your pharmacy*  Testing/Procedures: Your physician has requested that you have a lexiscan myoview. For further information please visit [email or web address] Please follow instruction sheet, as given.  The test will take approximately 3 to 4 hours to complete; you may bring reading material.  If someone comes with you to your appointment, they will need to remain in the main lobby due to limited space in the testing area.    How to prepare for your Myocardial Perfusion Test:  Do not eat or drink 3 hours prior to your test, except you may have water.  Do not consume products containing caffeine (regular or decaffeinated) 12 hours prior to your test. (ex: coffee, chocolate, sodas, tea).  Do wear comfortable clothes (no dresses or overalls) and walking shoes, tennis shoes preferred (No heels or open toe shoes are allowed).  Do NOT wear cologne, perfume, aftershave, or lotions (deodorant is allowed).  If you use an inhaler, use it the AM of your test and bring it with you.   If you use a nebulizer, use it the AM of your test.   If these instructions are not followed, your test will have to be rescheduled.  Follow-Up: At [facility], you and your health needs are our priority.  As part of our continuing mission to provide you with exceptional heart care, we have created designated Provider Care Teams.  These Care Teams include your primary Cardiologist (physician) and Advanced Practice Providers (APPs -  Physician Assistants and Nurse Practitioners) who all work together to provide you with the care you need, when you need it.  Your next appointment:   AFTER [place]   The format for your next appointment:   In Person  Provider:   You will see one of the following Advanced Practice Providers on your designated Care Team:     Theodore Demark, PA-C  Joni Reining, DNP, ANP  ANY APP
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
## 2020-06-29 ENCOUNTER — Telehealth: Payer: Self-pay | Admitting: Internal Medicine

## 2020-06-29 NOTE — Telephone Encounter (Signed)
[facility] received disabililty paperwork from [employer] via fax, paperwork was given to Jeannie Fend nurse

## 2020-06-29 NOTE — Telephone Encounter (Signed)
Patient wants to discuss concerns about his upcoming stress test. He would like to speak to Dr. Jacques Navy

## 2020-06-29 NOTE — Telephone Encounter (Signed)
Patient is returning call.  °

## 2020-06-29 NOTE — Telephone Encounter (Signed)
Returned call to patient who states that he went home after appointment on 3/9 and researched the medication used for his Lexiscan Stress test. Patient states that the research he found had many adverse effects and risks. Advised patient that the medication was safe and that he would be under constant surveillance during test. Patient states he would like to discuss this with Dr. Jacques Navy before having the test done. Advised patient I would forward her the message. Patient verbalized understanding.

## 2020-06-29 NOTE — Telephone Encounter (Signed)
Attempted to call patient, left message for patient to call back to office.   

## 2020-07-03 ENCOUNTER — Inpatient Hospital Stay (HOSPITAL_COMMUNITY): Admission: RE | Admit: 2020-07-03 | Payer: 59 | Source: Ambulatory Visit

## 2020-07-03 NOTE — Telephone Encounter (Signed)
Called patient to inform patient that Dr.Acharya will not complete disability paperwork.

## 2020-07-04 ENCOUNTER — Ambulatory Visit (HOSPITAL_COMMUNITY): Payer: 59

## 2020-07-04 ENCOUNTER — Ambulatory Visit: Payer: 59 | Attending: Pulmonary Disease | Admitting: Pulmonary Disease

## 2020-07-04 ENCOUNTER — Other Ambulatory Visit: Payer: Self-pay

## 2020-07-04 DIAGNOSIS — G4733 Obstructive sleep apnea (adult) (pediatric): Secondary | ICD-10-CM | POA: Diagnosis not present

## 2020-07-04 DIAGNOSIS — R0902 Hypoxemia: Secondary | ICD-10-CM | POA: Diagnosis not present

## 2020-07-08 ENCOUNTER — Telehealth: Payer: Self-pay | Admitting: Pulmonary Disease

## 2020-07-08 DIAGNOSIS — G4733 Obstructive sleep apnea (adult) (pediatric): Secondary | ICD-10-CM

## 2020-07-08 NOTE — Procedures (Signed)
  POLYSOMNOGRAPHY  Last, First: Roberto Lynch, Roberto Lynch MRN: [id number] Gender: Male Age (years): 65 Weight (lbs): 277 DOB: 03/21/1956 BMI: 41 Primary Care: No PCP Epworth Score: 3 Referring: Tomma Lightning MD Technician: Peak, Robert InterpretingTomma Lightning MD Study Type: NPSG Ordered Study Type: Split Night CPAP Study date: 07/04/2020 Location: [place] CLINICAL INFORMATION Roberto Lynch is a 65 year old Male and was referred to the sleep center for evaluation of N/A. Indications include N/A.  MEDICATIONS Patient self administered medications include: N/A. Medications administered during study include No sleep medicine administered.  SLEEP STUDY TECHNIQUE A multi-channel overnight Polysomnography study was performed. The channels recorded and monitored were central and occipital EEG, electrooculogram (EOG), submentalis EMG (chin), nasal and oral airflow, thoracic and abdominal wall motion, anterior tibialis EMG, snore microphone, electrocardiogram, and a pulse oximetry. TECHNICIAN COMMENTS Comments added by Technician: Patient had two awakenings to use the bathroom. Patient had more than two awakenings to use the bathroom Comments added by Scorer: N/A SLEEP ARCHITECTURE The study was initiated at 10:17:37 PM and terminated at 5:13:04 AM. The total recorded time was 415.4 minutes. EEG confirmed total sleep time was 330.4 minutes yielding a sleep efficiency of 79.5%. Sleep onset after lights out was 11.5 minutes with a REM latency of 105.0 minutes. The patient spent 9.53% of the night in stage N1 sleep, 79.12% in stage N2 sleep, 1.36% in stage N3 and 10% in REM. Wake after sleep onset (WASO) was 73.5 minutes. The Arousal Index was 7.6/hour. RESPIRATORY PARAMETERS There were a total of 90 respiratory disturbances out of which 0 were apneas ( 0 obstructive, 0 mixed, 0 central) and 90 hypopneas. The apnea/hypopnea index (AHI) was 16.3 events/hour. The central sleep apnea index was 0  events/hour. The REM AHI was 38.2 events/hour and NREM AHI was 13.9 events/hour. The supine AHI was 0.0 events/hour and the non supine AHI was 16.4 supine during 0.45% of sleep. Respiratory disturbances were associated with oxygen desaturation down to a nadir of 73.00% during sleep. The mean oxygen saturation during the study was 86.52%. The cumulative time under 88% oxygen saturation was 287.9 minutes.  LEG MOVEMENT DATA The total leg movements were 0 with a resulting leg movement index of 0.00/hr .Associated arousal with leg movement index was 0.0/hr.  CARDIAC DATA The underlying cardiac rhythm was most consistent with atrial fibrillation. Mean heart rate during sleep was 75.37 bpm. Additional rhythm abnormalities include None.  IMPRESSIONS - Moderate Obstructive Sleep apnea(OSA) - EKG showed no cardiac abnormalities. - Severe Oxygen Desaturation - The patient snored with moderate snoring volume. - No significant periodic leg movements(PLMs) during sleep, no significant associated arousals.  DIAGNOSIS - Obstructive Sleep Apnea (G47.33) - Nocturnal Hypoxemia (G47.36)  RECOMMENDATIONS - Therapeutic CPAP titration to determine optimal pressure required to alleviate sleep disordered breathing. - Avoid alcohol, sedatives and other CNS depressants that may worsen sleep apnea and disrupt normal sleep architecture. - Sleep hygiene should be reviewed to assess factors that may improve sleep quality. - Weight management and regular exercise should be initiated or continued.  [Electronically signed] 07/08/2020 08:50 PM  Virl Diamond MD NPI: [id number]

## 2020-07-08 NOTE — Telephone Encounter (Signed)
Call patient  Sleep study result  Date of study: 07/04/2020  Impression: Moderate obstructive sleep apnea Severe oxygen desaturations  Recommendation: Schedule CPAP titration for management of moderate obstructive sleep apnea with severe oxygen desaturations Optimize sleep hygiene Encourage weight loss measures  Follow-up following initiation of treatment with CPAP

## 2020-07-09 NOTE — Telephone Encounter (Signed)
ATC patient to go over sleep study results, Per DPR left detailed message letting him know results and that we are ordering a CPAP titration study so he would receive a call to set that up and once we had the results we would be able to order CPAP. Advised patient to call with any questions. Order has been placed. Nothing further needed at this time.

## 2020-07-12 NOTE — Telephone Encounter (Signed)
Spoke to the patient on the phone this evening for approximately 10 minutes regarding his concerns about Lexiscan used for stress testing.  We reviewed that he feels he is actively wheezing, and this is a contraindication to administering regadenoson.  He has had 2 episodes of chest pain several days apart.  He certainly has risk factors for coronary artery disease but feels primarily its exertional shortness of breath that bothers him.  We reviewed that coronary angiography would be the gold standard test in the setting since atrial fibrillation limits are ability to obtain a high quality coronary CTA.  Given inability to exercise, would not recommend stress echocardiography, and with atrial fibrillation would not administer dobutamine stress echo.  He would like to cancel his stress test at this time and I think that is reasonable.  We will push out our follow-up appointment until after he has obtained further guidance on his newly diagnosed moderate sleep apnea with possible oxygen requirement.  If patient has recurrent episodes of chest pain, I have encouraged him to contact our office to schedule coronary angiography.  If worrisome chest pain or dyspnea, he is to present to the emergency department.

## 2020-07-13 NOTE — Telephone Encounter (Signed)
Myocardial Perfusion Test has been cancelled. Appointment made for 09/25/20 at 10:20 with Dr. Jacques Navy in office.   Patient made aware. Advised patient to call back to office with any issues, questions, or concerns. Patient verbalized understanding.

## 2020-07-17 ENCOUNTER — Inpatient Hospital Stay (HOSPITAL_COMMUNITY): Admission: RE | Admit: 2020-07-17 | Payer: 59 | Source: Ambulatory Visit

## 2020-07-18 ENCOUNTER — Ambulatory Visit: Payer: 59 | Admitting: Internal Medicine

## 2020-07-18 ENCOUNTER — Ambulatory Visit (HOSPITAL_COMMUNITY): Payer: 59

## 2020-07-20 ENCOUNTER — Ambulatory Visit: Payer: 59 | Admitting: Internal Medicine

## 2020-07-25 ENCOUNTER — Ambulatory Visit: Payer: 59 | Attending: Pulmonary Disease | Admitting: Pulmonary Disease

## 2020-07-25 ENCOUNTER — Other Ambulatory Visit: Payer: Self-pay

## 2020-07-25 DIAGNOSIS — G4761 Periodic limb movement disorder: Secondary | ICD-10-CM | POA: Insufficient documentation

## 2020-07-25 DIAGNOSIS — I493 Ventricular premature depolarization: Secondary | ICD-10-CM | POA: Diagnosis not present

## 2020-07-25 DIAGNOSIS — G4733 Obstructive sleep apnea (adult) (pediatric): Secondary | ICD-10-CM | POA: Diagnosis present

## 2020-07-31 ENCOUNTER — Other Ambulatory Visit: Payer: Self-pay | Admitting: Internal Medicine

## 2020-08-05 ENCOUNTER — Telehealth: Payer: Self-pay | Admitting: Pulmonary Disease

## 2020-08-05 NOTE — Telephone Encounter (Signed)
Call patient  Sleep study result  Date of study: 07/25/2025  Impression: Mild obstructive sleep apnea  Recommendation: DME referral  Recommend CPAP therapy for mild obstructive sleep apnea  Recommend a trial of BiPAP 19/15 cm H2O with F&P Large full face mask  Encourage weight loss measures  Follow-up in the office 4 to 6 weeks following initiation of treatment

## 2020-08-05 NOTE — Procedures (Signed)
  POLYSOMNOGRAPHY  Last, First: Roberto Lynch, Roberto Lynch MRN: [id number] Gender: Male Age (years): 65 Weight (lbs): 277 DOB: 12-May-1955 BMI: 41 Primary Care: No PCP Epworth Score: 3 Referring: Tomma Lightning MD Technician: Peak, Robert InterpretingTomma Lightning MD Study Type: BiPAP Ordered Study Type: CPAP Study date: 07/25/2020 Location: [place] CLINICAL INFORMATION Roberto Lynch is a 65 year old Male and was referred to the sleep center for evaluation of N/A. Indications include N/A.  MEDICATIONS Patient self administered medications include: N/A. Medications administered during study include No sleep medicine administered.  SLEEP STUDY TECHNIQUE The patient underwent an attended overnight polysomnography titration to assess the effects of BIPAP therapy. The following variables were monitored: EEG(C4-A1, C3-A2, O1-A2, O2-A1), EOG, submental and leg EMG, ECG, oxyhemoglobin saturation by pulse oximetry, thoracic and abdominal respiratory effort belts, nasal/oral airflow by pressure sensor, body position sensor and snoring sensor. BIPAP pressure was titrated to eliminate apneas, hypopneas and oxygen desaturation. Hypopneas were scored per AASM definition IB (4% desaturation)  TECHNICIAN COMMENTS Comments added by Technician: Patient had one bathroom break during study time. Comments added by Scorer: N/A SLEEP ARCHITECTURE The study was initiated at 9:36:07 PM and terminated at 5:17:42 AM. Total recorded time was 461.6 minutes. EEG confirmed total sleep time was 401.8 minutes yielding a sleep efficiency of 87.0%. Sleep onset after lights out was 1.8 minutes with a REM latency of 59.5 minutes. The patient spent 4.23% of the night in stage N1 sleep, 74.12% in stage N2 sleep, 0.00% in stage N3 and 21.7% in REM. The Arousal Index was 4.5/hour. RESPIRATORY PARAMETERS There were a total of 42 respiratory disturbances out of which 0 were apneas ( 0 obstructive, 0 mixed, 0 central) and 42  hypopneas. The apnea/hypopnea index (AHI) was 6.3 events/hour. The central sleep apnea index was 0 events/hour. The REM AHI was 0.0 events/hour and NREM AHI was 8.0 events/hour. The supine AHI was 13.1 events/hour and the non supine AHI was 0.8 supine during 44.38% of sleep. Respiratory disturbances were associated with oxygen desaturation down to a nadir of 86.00% during sleep. The mean oxygen saturation during the study was 90.97%. The cumulative time under 88% oxygen saturation was 8.6 minutes. Optimal pressure not acheived.  LEG MOVEMENT DATA The total leg movements were 70 with a resulting leg movement index of 10.45. Associated arousal with leg movement index was 0.1. CARDIAC DATA The underlying cardiac rhythm was most consistent with atrial fibrillation. Mean heart rate during sleep was 65.04 bpm. Additional rhythm abnormalities include PVCs.   IMPRESSIONS - Mild Obstructive Sleep apnea(OSA), adequately treated with Bipap. - Electrocardiographic data showed presence of PVCs. - Moderate Oxygen Desaturation - The patient snored with moderate snoring volume. - Mild periodic leg movements(PLMs) during sleep. However, no significant associated arousals.   DIAGNOSIS - Obstructive Sleep Apnea (G47.33)   RECOMMENDATIONS - Recommend a trial of BiPAP 19/15 cm H2O with F&P Large full face mask - Avoid alcohol, sedatives and other CNS depressants that may worsen sleep apnea and disrupt normal sleep architecture. - Sleep hygiene should be reviewed to assess factors that may improve sleep quality. - Weight management and regular exercise should be initiated or continued. - Follow up as scheduled  [Electronically signed] 08/05/2020 08:31 PM  Virl Diamond MD NPI: [id number]

## 2020-08-06 ENCOUNTER — Other Ambulatory Visit: Payer: Self-pay | Admitting: Pulmonary Disease

## 2020-08-06 DIAGNOSIS — G4733 Obstructive sleep apnea (adult) (pediatric): Secondary | ICD-10-CM

## 2020-08-06 NOTE — Telephone Encounter (Signed)
I called and spoke with patient regarding in-lab sleep study. He verbalized understanding and is already using BiPAP at home. I sent in the orders and informed patient about wait due to recall. Patient verbalized understanding and will call when he receives machine to schedule 4-6 week f/u appt. Nothing further needed.

## 2020-08-08 ENCOUNTER — Other Ambulatory Visit: Payer: Self-pay | Admitting: Orthopedic Surgery

## 2020-08-08 DIAGNOSIS — M1990 Unspecified osteoarthritis, unspecified site: Secondary | ICD-10-CM

## 2020-08-15 ENCOUNTER — Ambulatory Visit
Admission: RE | Admit: 2020-08-15 | Discharge: 2020-08-15 | Disposition: A | Discharge status: Home / Self Care | Payer: 59 | Source: Ambulatory Visit | Attending: Orthopedic Surgery | Admitting: Orthopedic Surgery

## 2020-08-15 DIAGNOSIS — M1990 Unspecified osteoarthritis, unspecified site: Secondary | ICD-10-CM

## 2020-08-15 MED ORDER — IOPAMIDOL (ISOVUE-M 200) INJECTION 41%
1.0000 mL | Freq: Once | INTRAMUSCULAR | Status: AC
  Administered 2020-08-15: 1 mL via INTRA_ARTICULAR

## 2020-08-15 MED ORDER — METHYLPREDNISOLONE ACETATE 40 MG/ML INJ SUSP (RADIOLOG
40.0000 mg | Freq: Once | INTRAMUSCULAR | Status: AC
  Administered 2020-08-15: 40 mg via INTRA_ARTICULAR

## 2020-08-24 ENCOUNTER — Telehealth: Payer: Self-pay | Admitting: Internal Medicine

## 2020-08-24 ENCOUNTER — Other Ambulatory Visit: Payer: Self-pay

## 2020-08-24 MED ORDER — ELIQUIS 5 MG PO TABS: 5.0000 mg | ORAL_TABLET | Freq: Two times a day (BID) | ORAL | 5 refills | Status: AC

## 2020-08-24 NOTE — Telephone Encounter (Signed)
Prescription refill request for Eliquis received. Indication:atrial fib Last office visit:3/22  acharya Scr:1.0 1/22 Age: 65 Weight:125.6 kg  Prescription refilled

## 2020-08-24 NOTE — Telephone Encounter (Signed)
*  STAT* If patient is at the pharmacy, call can be transferred to refill team.   1. Which medications need to be refilled? (please list name of each medication and dose if known)   apixaban (ELIQUIS) 5 MG TABS tablet     2. Which pharmacy/location (including street and city if local pharmacy) is medication to be sent to? [id number] - [place], [place] - [address]  3. Do they need a 30 day or 90 day supply? 90 day supply  Patient states [facility] has been [facility] medication for a week and he is now completely out.

## 2020-08-27 ENCOUNTER — Encounter: Payer: 59 | Attending: Physical Medicine & Rehabilitation | Admitting: Physical Medicine & Rehabilitation

## 2020-08-27 ENCOUNTER — Encounter: Payer: Self-pay | Admitting: Physical Medicine & Rehabilitation

## 2020-08-27 ENCOUNTER — Other Ambulatory Visit: Payer: Self-pay

## 2020-08-27 VITALS — BP 158/10 | HR 79 | Temp 98.6°F | Ht 69.0 in | Wt 255.0 lb

## 2020-08-27 DIAGNOSIS — Z720 Tobacco use: Secondary | ICD-10-CM | POA: Diagnosis present

## 2020-08-27 DIAGNOSIS — R490 Dysphonia: Secondary | ICD-10-CM | POA: Diagnosis not present

## 2020-08-27 DIAGNOSIS — R269 Unspecified abnormalities of gait and mobility: Secondary | ICD-10-CM

## 2020-08-27 DIAGNOSIS — T07XXXA Unspecified multiple injuries, initial encounter: Secondary | ICD-10-CM | POA: Diagnosis present
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
## 2020-09-25 ENCOUNTER — Telehealth: Payer: Self-pay | Admitting: Internal Medicine

## 2020-09-25 ENCOUNTER — Other Ambulatory Visit: Payer: Self-pay

## 2020-09-25 ENCOUNTER — Ambulatory Visit (INDEPENDENT_AMBULATORY_CARE_PROVIDER_SITE_OTHER): Payer: 59 | Admitting: Internal Medicine

## 2020-09-25 ENCOUNTER — Encounter: Payer: Self-pay | Admitting: Internal Medicine

## 2020-09-25 VITALS — BP 138/86 | HR 73 | Ht 69.0 in | Wt 247.4 lb

## 2020-09-25 DIAGNOSIS — R072 Precordial pain: Secondary | ICD-10-CM

## 2020-09-25 DIAGNOSIS — I4819 Other persistent atrial fibrillation: Secondary | ICD-10-CM | POA: Diagnosis not present

## 2020-09-25 DIAGNOSIS — R600 Localized edema: Secondary | ICD-10-CM | POA: Diagnosis not present

## 2020-09-25 DIAGNOSIS — Z86718 Personal history of other venous thrombosis and embolism: Secondary | ICD-10-CM

## 2020-09-25 DIAGNOSIS — Z720 Tobacco use: Secondary | ICD-10-CM

## 2020-09-25 NOTE — Telephone Encounter (Signed)
    Pt requesting to speak with Roberto Lynch. He said he was seen today by Dr. Jacques Navy and was requested to call back
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
## 2020-09-25 NOTE — Patient Instructions (Signed)

## 2020-09-25 NOTE — Telephone Encounter (Signed)
Patient calling to give current medication list after reviewing medications at home after appointment.   Patient states that he only takes the following medications   Albuterol- As needed   Symbicort- Once daily  Diltiazem- 240mg  Once daily  Eliquis- 5mg  Twice daily  Melatonin 10mg  At bedtime  Pantoprazole 40mg  Once daily  Multivitamin- Once daily  Chromium Picolinate- Once daily    Advised patient that I would forward list of medications to Dr. . Medication list updated. Advised patient to call back to office with any issues, questions, or concerns. Patient verbalized understanding.

## 2021-02-21 ENCOUNTER — Other Ambulatory Visit: Payer: Self-pay | Admitting: Orthopedic Surgery

## 2021-02-21 DIAGNOSIS — M19071 Primary osteoarthritis, right ankle and foot: Secondary | ICD-10-CM

## 2021-02-24 ENCOUNTER — Other Ambulatory Visit: Payer: Self-pay | Admitting: Internal Medicine

## 2021-02-25 ENCOUNTER — Ambulatory Visit
Admission: RE | Admit: 2021-02-25 | Discharge: 2021-02-25 | Disposition: A | Discharge status: Home / Self Care | Payer: 59 | Source: Ambulatory Visit | Attending: Orthopedic Surgery | Admitting: Orthopedic Surgery

## 2021-02-25 ENCOUNTER — Other Ambulatory Visit: Payer: Self-pay

## 2021-02-25 DIAGNOSIS — M19071 Primary osteoarthritis, right ankle and foot: Secondary | ICD-10-CM

## 2021-02-25 MED ORDER — IOPAMIDOL (ISOVUE-M 200) INJECTION 41%
1.0000 mL | Freq: Once | INTRAMUSCULAR | Status: AC
  Administered 2021-02-25: 1 mL via INTRA_ARTICULAR

## 2021-02-25 MED ORDER — TRIAMCINOLONE ACETONIDE 40 MG/ML IJ SUSP (RADIOLOGY)
40.0000 mg | Freq: Once | INTRAMUSCULAR | Status: AC
  Administered 2021-02-25: 40 mg via INTRA_ARTICULAR

## 2021-03-29 ENCOUNTER — Ambulatory Visit: Payer: 59 | Admitting: Internal Medicine

## 2021-04-26 ENCOUNTER — Other Ambulatory Visit: Payer: Self-pay | Admitting: Internal Medicine

## 2021-05-27 ENCOUNTER — Telehealth: Payer: Self-pay

## 2021-05-27 NOTE — Telephone Encounter (Signed)
Received notification that patient will need PA for Eliquis renewed.   Will forward to  Prior Auth nurse for help with this.

## 2021-05-28 NOTE — Telephone Encounter (Signed)
**Note De-Identified  Obfuscation** I attempted this Eliquis PA through covermymeds but received a message that Eliquis is covered by his plan. I called [facility] and was advised that a PA is not required but that the pts co-pay is $656.99/30 day supply due to the pts deductible.

## 2021-06-26 NOTE — Telephone Encounter (Signed)
Attempted to call patient to follow up on if he has been able to get his Eliquis- unable to leave message at this time. Will try again at another time.  ?
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
## 2021-07-23 IMAGING — DX DG CHEST 1V PORT
1 series · 1 of 1 positions shown · non-contrast
Comparison: December 30, 2019

CLINICAL DATA: Hypoxia

EXAM:
PORTABLE CHEST 1 VIEW

[chest]
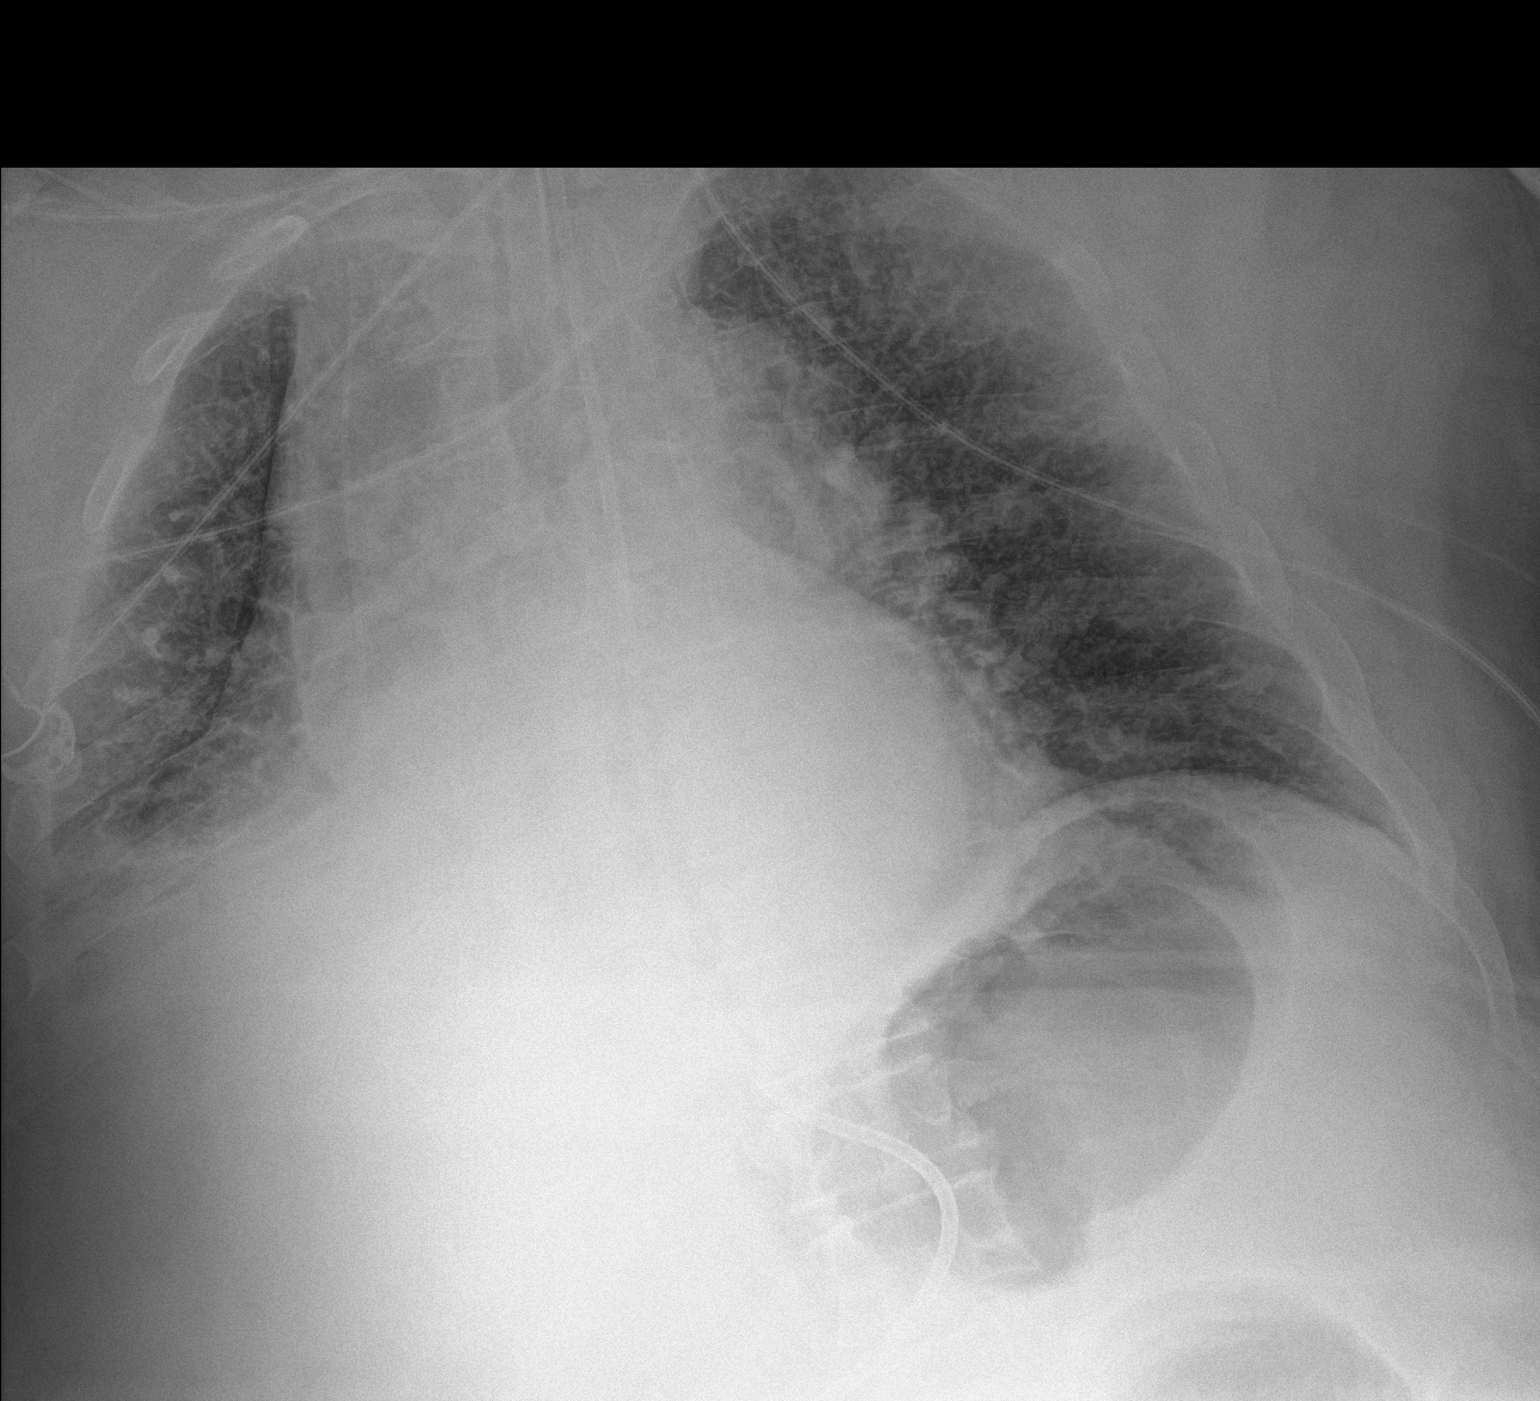

[1 of 1 positions shown; findings below may reference images not displayed]

FINDINGS: Endotracheal tube tip is 4.4 cm above the carina. Feeding tube tip
is below the diaphragm. Central catheter tip is at the cavoatrial
junction. No pneumothorax. There is a small right pleural effusion.
There is cardiomegaly with pulmonary venous hypertension. No
appreciable consolidation. Interstitial mildly prominent. No evident
adenopathy. No bone lesions.
IMPRESSION: Small right pleural effusion. Cardiomegaly with pulmonary vascular
congestion. Question mild interstitial edema. There may be a degree
of underlying congestive heart failure.

Tube and catheter positions as described without pneumothorax.
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
## 2021-08-05 ENCOUNTER — Ambulatory Visit: Payer: 59 | Admitting: Internal Medicine
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
## 2021-08-09 ENCOUNTER — Encounter: Payer: Self-pay | Admitting: Internal Medicine

## 2021-08-12 NOTE — Telephone Encounter (Signed)
Patient no showed to recent OV with Dr. Jacques Navy on 08/05/21. Upon chart review it looks as if patient is following with [facility].  ?

## 2021-08-14 IMAGING — DX DG CHEST 1V PORT
1 series · 1 of 1 positions shown · non-contrast
Comparison: Single-view of the chest 01/21/2020.

CLINICAL DATA: Respiratory failure. Status post tracheostomy tube
placement.

EXAM:
PORTABLE CHEST 1 VIEW

[chest]
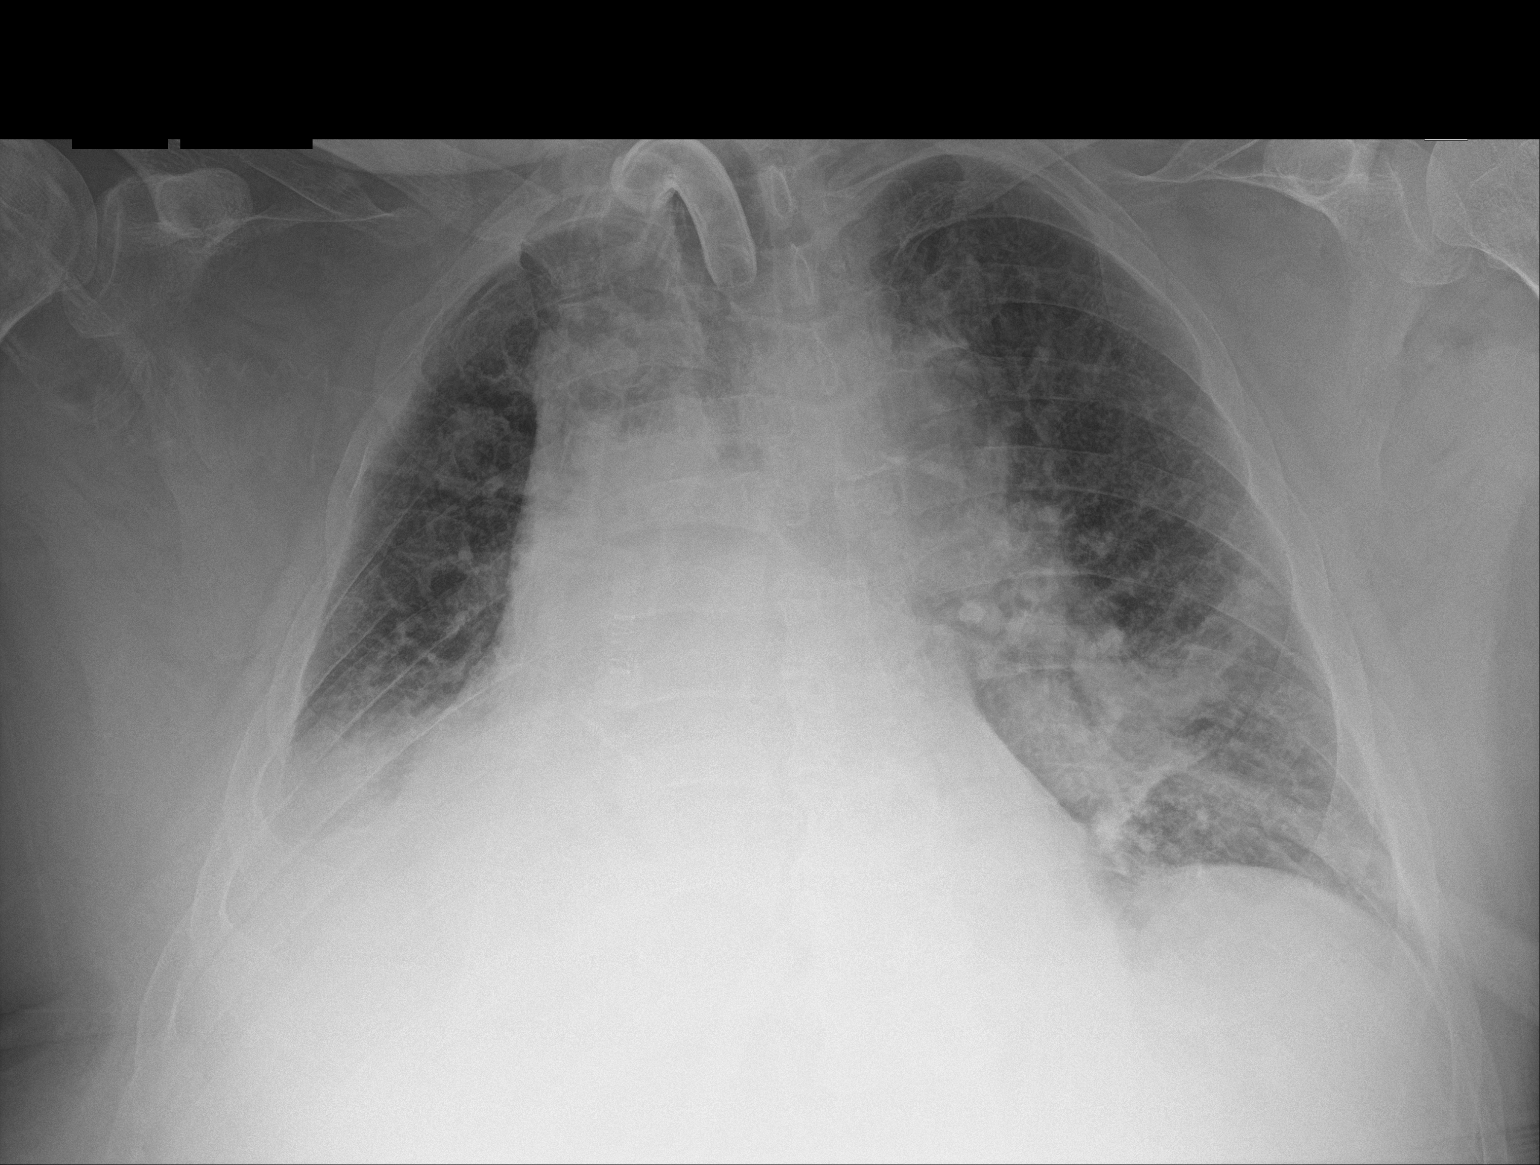

[1 of 1 positions shown; findings below may reference images not displayed]

FINDINGS: Endotracheal tube and feeding tube have been removed. A new
tracheostomy tube is in place with the tip in good position at the
level of the clavicular heads. Right worse than left basilar
airspace disease is unchanged. Heart size is upper normal. No
pneumothorax.
IMPRESSION: New tracheostomy tube projects in good position.

No change in right worse than left basilar airspace disease.

## 2021-08-21 IMAGING — DX DG CHEST 1V PORT
1 series · 1 of 1 positions shown · non-contrast
Comparison: January 29, 2020

CLINICAL DATA: Shortness of breath.

EXAM:
PORTABLE CHEST 1 VIEW

[chest ap]
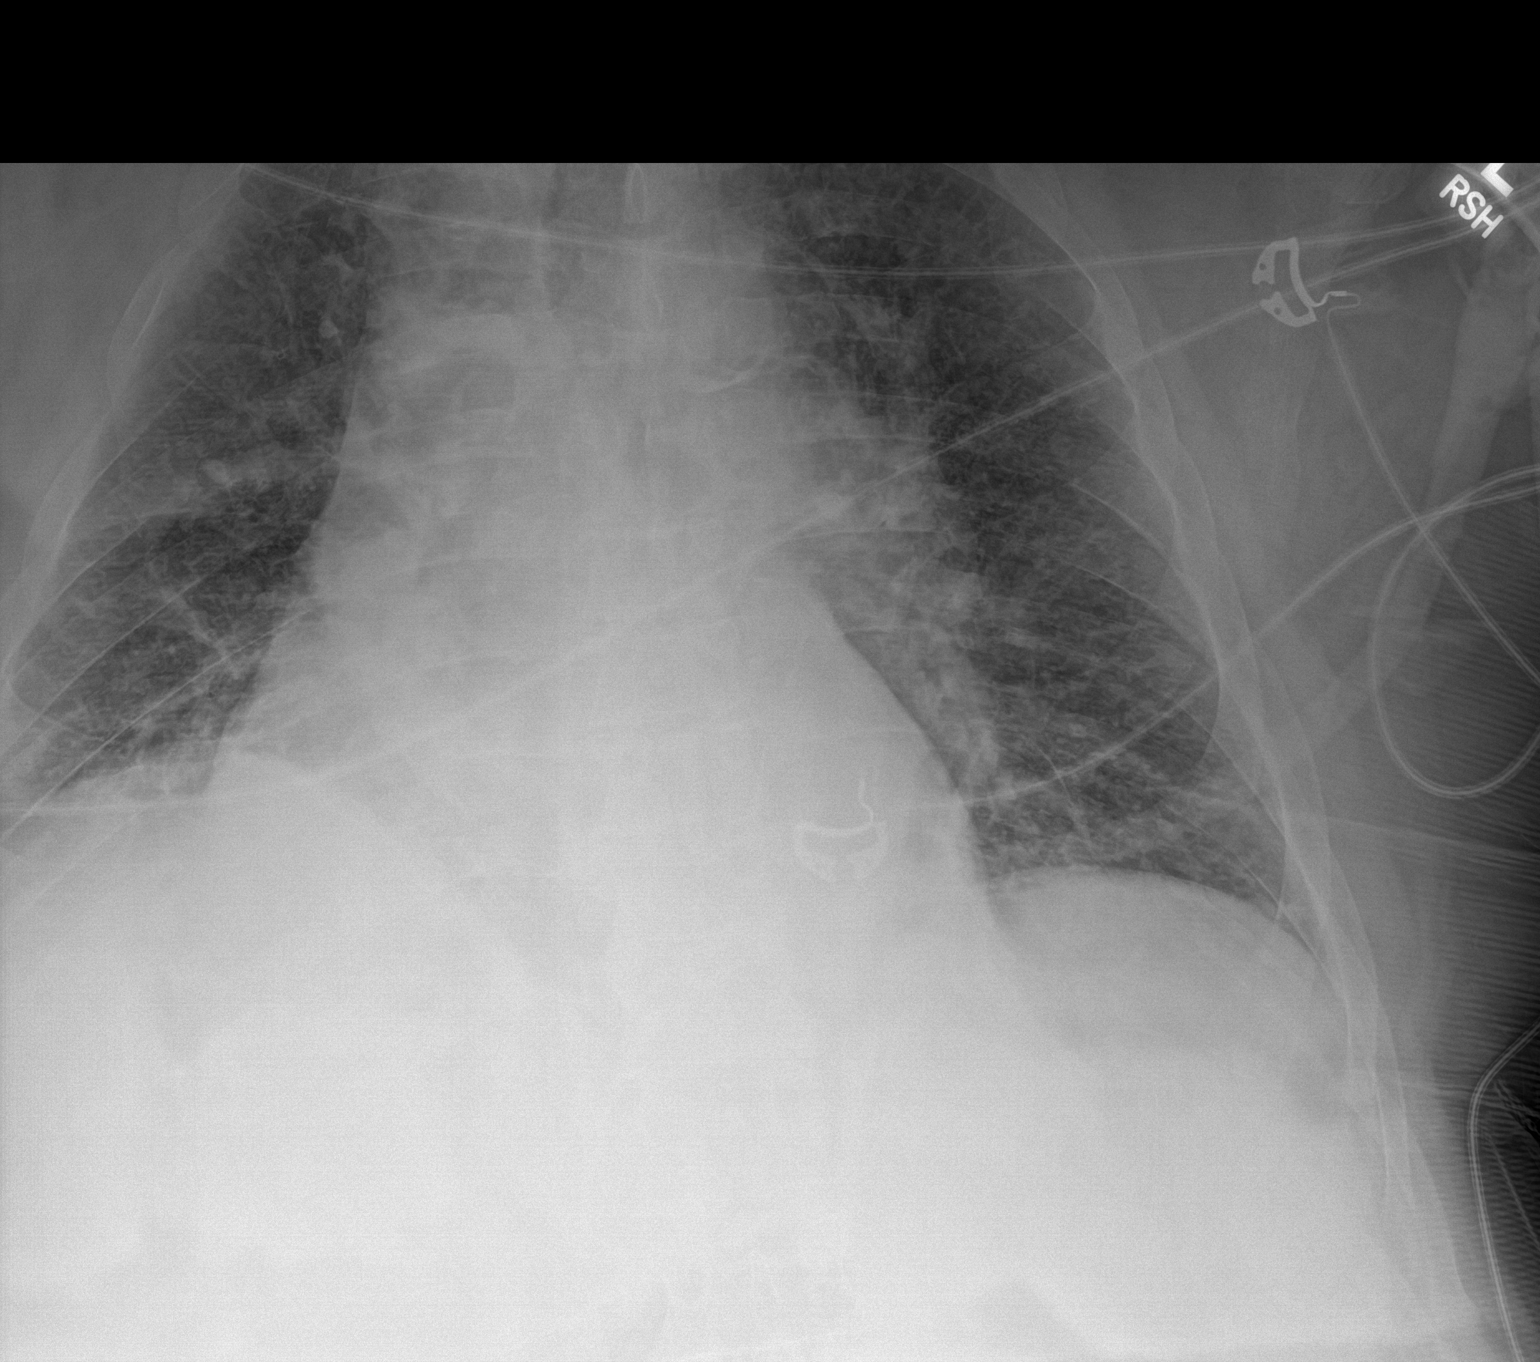

[1 of 1 positions shown; findings below may reference images not displayed]

FINDINGS: Tracheostomy catheter tip is 7.2 cm above the carina. No
pneumothorax. There is interstitial pulmonary edema. No appreciable
consolidation. There is cardiomegaly with pulmonary venous
hypertension. There is aortic atherosclerosis. No adenopathy. No
bone lesions.
IMPRESSION: Tracheostomy as described without pneumothorax. Cardiomegaly with
pulmonary venous hypertension interstitial edema. Suspect a degree
of congestive heart failure. No consolidation.

Aortic Atherosclerosis ([id number]).
